# Patient Record
Sex: Female | Born: 1935 | Race: Black or African American | Hispanic: No | Smoking: Never smoker
Health system: Southern US, Community
[De-identification: ages and names within clinical notes are randomized; demographics above are authoritative.]

## PROBLEM LIST (undated history)

## (undated) DIAGNOSIS — K8020 Calculus of gallbladder without cholecystitis without obstruction: Secondary | ICD-10-CM

## (undated) DIAGNOSIS — M1990 Unspecified osteoarthritis, unspecified site: Secondary | ICD-10-CM

## (undated) DIAGNOSIS — Z95828 Presence of other vascular implants and grafts: Secondary | ICD-10-CM

## (undated) DIAGNOSIS — K259 Gastric ulcer, unspecified as acute or chronic, without hemorrhage or perforation: Secondary | ICD-10-CM

## (undated) DIAGNOSIS — Z8489 Family history of other specified conditions: Secondary | ICD-10-CM

## (undated) DIAGNOSIS — C189 Malignant neoplasm of colon, unspecified: Secondary | ICD-10-CM

## (undated) DIAGNOSIS — E7800 Pure hypercholesterolemia, unspecified: Secondary | ICD-10-CM

## (undated) DIAGNOSIS — H9190 Unspecified hearing loss, unspecified ear: Secondary | ICD-10-CM

## (undated) DIAGNOSIS — I82409 Acute embolism and thrombosis of unspecified deep veins of unspecified lower extremity: Secondary | ICD-10-CM

## (undated) DIAGNOSIS — R9431 Abnormal electrocardiogram [ECG] [EKG]: Secondary | ICD-10-CM

## (undated) DIAGNOSIS — I509 Heart failure, unspecified: Secondary | ICD-10-CM

## (undated) HISTORY — DX: Acute embolism and thrombosis of unspecified deep veins of unspecified lower extremity: I82.409

## (undated) HISTORY — DX: Heart failure, unspecified: I50.9

## (undated) HISTORY — PX: DILATION AND CURETTAGE OF UTERUS: SHX78

---

## 1996-10-02 DIAGNOSIS — C189 Malignant neoplasm of colon, unspecified: Secondary | ICD-10-CM

## 1996-10-02 DIAGNOSIS — Z95828 Presence of other vascular implants and grafts: Secondary | ICD-10-CM

## 1996-10-02 DIAGNOSIS — K259 Gastric ulcer, unspecified as acute or chronic, without hemorrhage or perforation: Secondary | ICD-10-CM

## 1996-10-02 HISTORY — DX: Malignant neoplasm of colon, unspecified: C18.9

## 1996-10-02 HISTORY — DX: Presence of other vascular implants and grafts: Z95.828

## 1996-10-02 HISTORY — PX: COLON SURGERY: SHX602

## 1996-10-02 HISTORY — DX: Gastric ulcer, unspecified as acute or chronic, without hemorrhage or perforation: K25.9

## 1998-05-25 ENCOUNTER — Ambulatory Visit (HOSPITAL_COMMUNITY): Admission: RE | Admit: 1998-05-25 | Discharge: 1998-05-25 | Payer: Self-pay | Admitting: *Deleted

## 1998-05-27 ENCOUNTER — Ambulatory Visit (HOSPITAL_COMMUNITY): Admission: RE | Admit: 1998-05-27 | Discharge: 1998-05-27 | Payer: Self-pay | Admitting: *Deleted

## 1998-06-01 ENCOUNTER — Ambulatory Visit (HOSPITAL_COMMUNITY): Admission: RE | Admit: 1998-06-01 | Discharge: 1998-06-01 | Payer: Self-pay | Admitting: *Deleted

## 1998-06-03 ENCOUNTER — Ambulatory Visit (HOSPITAL_COMMUNITY): Admission: RE | Admit: 1998-06-03 | Discharge: 1998-06-03 | Payer: Self-pay | Admitting: *Deleted

## 1998-07-22 ENCOUNTER — Encounter: Payer: Self-pay | Admitting: General Surgery

## 1998-07-27 ENCOUNTER — Ambulatory Visit (HOSPITAL_COMMUNITY): Admission: RE | Admit: 1998-07-27 | Discharge: 1998-07-27 | Payer: Self-pay | Admitting: General Surgery

## 1998-07-27 ENCOUNTER — Encounter: Payer: Self-pay | Admitting: General Surgery

## 1998-09-25 ENCOUNTER — Emergency Department (HOSPITAL_COMMUNITY): Admission: EM | Admit: 1998-09-25 | Discharge: 1998-09-26 | Payer: Self-pay | Admitting: Emergency Medicine

## 1998-12-08 ENCOUNTER — Ambulatory Visit (HOSPITAL_COMMUNITY): Admission: RE | Admit: 1998-12-08 | Discharge: 1998-12-08 | Payer: Self-pay | Admitting: Internal Medicine

## 1998-12-08 ENCOUNTER — Encounter: Payer: Self-pay | Admitting: Internal Medicine

## 1998-12-09 ENCOUNTER — Ambulatory Visit (HOSPITAL_COMMUNITY): Admission: RE | Admit: 1998-12-09 | Discharge: 1998-12-09 | Payer: Self-pay | Admitting: Internal Medicine

## 1998-12-09 ENCOUNTER — Encounter: Payer: Self-pay | Admitting: Internal Medicine

## 1998-12-22 ENCOUNTER — Ambulatory Visit (HOSPITAL_BASED_OUTPATIENT_CLINIC_OR_DEPARTMENT_OTHER): Admission: RE | Admit: 1998-12-22 | Discharge: 1998-12-22 | Payer: Self-pay | Admitting: Orthopedic Surgery

## 1999-01-25 ENCOUNTER — Encounter: Admission: RE | Admit: 1999-01-25 | Discharge: 1999-02-17 | Payer: Self-pay | Admitting: Orthopedic Surgery

## 1999-08-17 ENCOUNTER — Ambulatory Visit (HOSPITAL_COMMUNITY): Admission: RE | Admit: 1999-08-17 | Discharge: 1999-08-17 | Payer: Self-pay | Admitting: *Deleted

## 1999-12-14 ENCOUNTER — Ambulatory Visit (HOSPITAL_COMMUNITY): Admission: RE | Admit: 1999-12-14 | Discharge: 1999-12-14 | Payer: Self-pay | Admitting: *Deleted

## 2000-03-23 ENCOUNTER — Encounter: Admission: RE | Admit: 2000-03-23 | Discharge: 2000-04-05 | Payer: Self-pay | Admitting: Neurology

## 2000-05-31 ENCOUNTER — Encounter: Admission: RE | Admit: 2000-05-31 | Discharge: 2000-05-31 | Payer: Self-pay | Admitting: *Deleted

## 2001-05-16 ENCOUNTER — Other Ambulatory Visit: Admission: RE | Admit: 2001-05-16 | Discharge: 2001-05-16 | Payer: Self-pay | Admitting: *Deleted

## 2001-06-05 ENCOUNTER — Encounter: Admission: RE | Admit: 2001-06-05 | Discharge: 2001-06-05 | Payer: Self-pay | Admitting: *Deleted

## 2001-08-15 ENCOUNTER — Ambulatory Visit (HOSPITAL_COMMUNITY): Admission: RE | Admit: 2001-08-15 | Discharge: 2001-08-15 | Payer: Self-pay | Admitting: *Deleted

## 2001-10-31 ENCOUNTER — Encounter (INDEPENDENT_AMBULATORY_CARE_PROVIDER_SITE_OTHER): Payer: Self-pay | Admitting: *Deleted

## 2002-04-18 ENCOUNTER — Encounter: Admission: RE | Admit: 2002-04-18 | Discharge: 2002-04-18 | Payer: Self-pay | Admitting: Internal Medicine

## 2002-04-18 ENCOUNTER — Encounter: Payer: Self-pay | Admitting: Internal Medicine

## 2002-08-05 ENCOUNTER — Ambulatory Visit (HOSPITAL_COMMUNITY): Admission: RE | Admit: 2002-08-05 | Discharge: 2002-08-05 | Payer: Self-pay | Admitting: Oncology

## 2002-08-05 ENCOUNTER — Encounter (HOSPITAL_COMMUNITY): Payer: Self-pay | Admitting: Oncology

## 2003-12-16 ENCOUNTER — Ambulatory Visit (HOSPITAL_COMMUNITY): Admission: RE | Admit: 2003-12-16 | Discharge: 2003-12-16 | Payer: Self-pay | Admitting: *Deleted

## 2003-12-16 ENCOUNTER — Encounter (INDEPENDENT_AMBULATORY_CARE_PROVIDER_SITE_OTHER): Payer: Self-pay | Admitting: *Deleted

## 2004-02-09 ENCOUNTER — Encounter: Admission: RE | Admit: 2004-02-09 | Discharge: 2004-02-09 | Payer: Self-pay | Admitting: Internal Medicine

## 2005-03-02 ENCOUNTER — Ambulatory Visit: Payer: Self-pay | Admitting: Oncology

## 2005-07-17 ENCOUNTER — Encounter: Admission: RE | Admit: 2005-07-17 | Discharge: 2005-07-17 | Payer: Self-pay | Admitting: Internal Medicine

## 2005-07-31 ENCOUNTER — Encounter: Admission: RE | Admit: 2005-07-31 | Discharge: 2005-07-31 | Payer: Self-pay | Admitting: Internal Medicine

## 2005-09-05 ENCOUNTER — Ambulatory Visit: Payer: Self-pay | Admitting: Oncology

## 2005-09-05 ENCOUNTER — Ambulatory Visit: Admission: RE | Admit: 2005-09-05 | Discharge: 2005-09-05 | Payer: Self-pay | Admitting: Oncology

## 2006-02-25 ENCOUNTER — Ambulatory Visit: Payer: Self-pay | Admitting: Oncology

## 2006-03-02 LAB — CBC WITH DIFFERENTIAL/PLATELET
BASO%: 0.5 % (ref 0.0–2.0)
Basophils Absolute: 0 10*3/uL (ref 0.0–0.1)
EOS%: 5.4 % (ref 0.0–7.0)
Eosinophils Absolute: 0.2 10*3/uL (ref 0.0–0.5)
HCT: 38.7 % (ref 34.8–46.6)
HGB: 13.3 g/dL (ref 11.6–15.9)
LYMPH%: 28 % (ref 14.0–48.0)
MCH: 31.8 pg (ref 26.0–34.0)
MCHC: 34.3 g/dL (ref 32.0–36.0)
MCV: 92.7 fL (ref 81.0–101.0)
MONO#: 0.6 10*3/uL (ref 0.1–0.9)
MONO%: 13.2 % — ABNORMAL HIGH (ref 0.0–13.0)
NEUT#: 2.4 10*3/uL (ref 1.5–6.5)
NEUT%: 52.9 % (ref 39.6–76.8)
Platelets: 220 10*3/uL (ref 145–400)
RBC: 4.17 10*6/uL (ref 3.70–5.32)
RDW: 13.1 % (ref 11.3–14.5)
WBC: 4.6 10*3/uL (ref 3.9–10.0)
lymph#: 1.3 10*3/uL (ref 0.9–3.3)

## 2006-03-02 LAB — COMPREHENSIVE METABOLIC PANEL
ALT: 15 U/L (ref 0–40)
AST: 19 U/L (ref 0–37)
Albumin: 3.8 g/dL (ref 3.5–5.2)
Alkaline Phosphatase: 61 U/L (ref 39–117)
BUN: 12 mg/dL (ref 6–23)
CO2: 28 mEq/L (ref 19–32)
Calcium: 9.5 mg/dL (ref 8.4–10.5)
Chloride: 107 mEq/L (ref 96–112)
Creatinine, Ser: 1.05 mg/dL (ref 0.40–1.20)
Glucose, Bld: 84 mg/dL (ref 70–99)
Potassium: 4.1 mEq/L (ref 3.5–5.3)
Sodium: 142 mEq/L (ref 135–145)
Total Bilirubin: 0.6 mg/dL (ref 0.3–1.2)
Total Protein: 7 g/dL (ref 6.0–8.3)

## 2006-03-02 LAB — D-DIMER, QUANTITATIVE: D-Dimer, Quant: 0.33 ug/mL-FEU (ref 0.00–0.48)

## 2006-03-02 LAB — LACTATE DEHYDROGENASE: LDH: 189 U/L (ref 94–250)

## 2007-01-11 ENCOUNTER — Encounter: Admission: RE | Admit: 2007-01-11 | Discharge: 2007-01-11 | Payer: Self-pay | Admitting: Internal Medicine

## 2007-02-27 ENCOUNTER — Ambulatory Visit: Payer: Self-pay | Admitting: Oncology

## 2007-03-01 LAB — COMPREHENSIVE METABOLIC PANEL
ALT: 16 U/L (ref 0–35)
AST: 17 U/L (ref 0–37)
Albumin: 4 g/dL (ref 3.5–5.2)
Alkaline Phosphatase: 71 U/L (ref 39–117)
BUN: 14 mg/dL (ref 6–23)
CO2: 27 mEq/L (ref 19–32)
Calcium: 9.5 mg/dL (ref 8.4–10.5)
Chloride: 106 mEq/L (ref 96–112)
Creatinine, Ser: 0.95 mg/dL (ref 0.40–1.20)
Glucose, Bld: 92 mg/dL (ref 70–99)
Potassium: 3.9 mEq/L (ref 3.5–5.3)
Sodium: 142 mEq/L (ref 135–145)
Total Bilirubin: 0.6 mg/dL (ref 0.3–1.2)
Total Protein: 7.2 g/dL (ref 6.0–8.3)

## 2007-03-01 LAB — CBC WITH DIFFERENTIAL/PLATELET
BASO%: 0.5 % (ref 0.0–2.0)
Basophils Absolute: 0 10*3/uL (ref 0.0–0.1)
EOS%: 4.5 % (ref 0.0–7.0)
Eosinophils Absolute: 0.3 10*3/uL (ref 0.0–0.5)
HCT: 37.9 % (ref 34.8–46.6)
HGB: 13.5 g/dL (ref 11.6–15.9)
LYMPH%: 28 % (ref 14.0–48.0)
MCH: 32.2 pg (ref 26.0–34.0)
MCHC: 35.7 g/dL (ref 32.0–36.0)
MCV: 90.2 fL (ref 81.0–101.0)
MONO#: 0.6 10*3/uL (ref 0.1–0.9)
MONO%: 10 % (ref 0.0–13.0)
NEUT#: 3.6 10*3/uL (ref 1.5–6.5)
NEUT%: 57 % (ref 39.6–76.8)
Platelets: 194 10*3/uL (ref 145–400)
RBC: 4.2 10*6/uL (ref 3.70–5.32)
RDW: 12.4 % (ref 11.3–14.5)
WBC: 6.2 10*3/uL (ref 3.9–10.0)
lymph#: 1.7 10*3/uL (ref 0.9–3.3)

## 2007-03-01 LAB — LACTATE DEHYDROGENASE: LDH: 176 U/L (ref 94–250)

## 2008-02-26 ENCOUNTER — Ambulatory Visit: Payer: Self-pay | Admitting: Oncology

## 2008-02-28 LAB — CBC WITH DIFFERENTIAL/PLATELET
BASO%: 1 % (ref 0.0–2.0)
Basophils Absolute: 0.1 10*3/uL (ref 0.0–0.1)
EOS%: 4.1 % (ref 0.0–7.0)
Eosinophils Absolute: 0.3 10*3/uL (ref 0.0–0.5)
HCT: 40.5 % (ref 34.8–46.6)
HGB: 14.1 g/dL (ref 11.6–15.9)
LYMPH%: 34.7 % (ref 14.0–48.0)
MCH: 31.3 pg (ref 26.0–34.0)
MCHC: 34.8 g/dL (ref 32.0–36.0)
MCV: 90.1 fL (ref 81.0–101.0)
MONO#: 0.7 10*3/uL (ref 0.1–0.9)
MONO%: 11.7 % (ref 0.0–13.0)
NEUT#: 3.1 10*3/uL (ref 1.5–6.5)
NEUT%: 48.5 % (ref 39.6–76.8)
Platelets: 174 10*3/uL (ref 145–400)
RBC: 4.5 10*6/uL (ref 3.70–5.32)
RDW: 11.5 % (ref 11.3–14.5)
WBC: 6.4 10*3/uL (ref 3.9–10.0)
lymph#: 2.2 10*3/uL (ref 0.9–3.3)

## 2008-02-28 LAB — COMPREHENSIVE METABOLIC PANEL
ALT: 15 U/L (ref 0–35)
AST: 20 U/L (ref 0–37)
Albumin: 4 g/dL (ref 3.5–5.2)
Alkaline Phosphatase: 64 U/L (ref 39–117)
BUN: 12 mg/dL (ref 6–23)
CO2: 26 mEq/L (ref 19–32)
Calcium: 9.2 mg/dL (ref 8.4–10.5)
Chloride: 105 mEq/L (ref 96–112)
Creatinine, Ser: 1.03 mg/dL (ref 0.40–1.20)
Glucose, Bld: 96 mg/dL (ref 70–99)
Potassium: 4 mEq/L (ref 3.5–5.3)
Sodium: 141 mEq/L (ref 135–145)
Total Bilirubin: 0.6 mg/dL (ref 0.3–1.2)
Total Protein: 7.5 g/dL (ref 6.0–8.3)

## 2008-02-28 LAB — LACTATE DEHYDROGENASE: LDH: 214 U/L (ref 94–250)

## 2008-10-02 DIAGNOSIS — K8020 Calculus of gallbladder without cholecystitis without obstruction: Secondary | ICD-10-CM

## 2008-10-02 HISTORY — DX: Calculus of gallbladder without cholecystitis without obstruction: K80.20

## 2008-10-04 ENCOUNTER — Emergency Department (HOSPITAL_COMMUNITY): Admission: EM | Admit: 2008-10-04 | Discharge: 2008-10-04 | Payer: Self-pay | Admitting: Family Medicine

## 2008-10-04 ENCOUNTER — Encounter (INDEPENDENT_AMBULATORY_CARE_PROVIDER_SITE_OTHER): Payer: Self-pay | Admitting: *Deleted

## 2009-02-23 ENCOUNTER — Ambulatory Visit: Payer: Self-pay | Admitting: Oncology

## 2009-02-25 LAB — CBC WITH DIFFERENTIAL/PLATELET
BASO%: 0.6 % (ref 0.0–2.0)
Basophils Absolute: 0 10*3/uL (ref 0.0–0.1)
EOS%: 7.2 % — ABNORMAL HIGH (ref 0.0–7.0)
Eosinophils Absolute: 0.5 10*3/uL (ref 0.0–0.5)
HCT: 39.7 % (ref 34.8–46.6)
HGB: 13.6 g/dL (ref 11.6–15.9)
LYMPH%: 21.4 % (ref 14.0–49.7)
MCH: 31.6 pg (ref 25.1–34.0)
MCHC: 34.3 g/dL (ref 31.5–36.0)
MCV: 92 fL (ref 79.5–101.0)
MONO#: 0.7 10*3/uL (ref 0.1–0.9)
MONO%: 10.5 % (ref 0.0–14.0)
NEUT#: 4.1 10*3/uL (ref 1.5–6.5)
NEUT%: 60.3 % (ref 38.4–76.8)
Platelets: 193 10*3/uL (ref 145–400)
RBC: 4.32 10*6/uL (ref 3.70–5.45)
RDW: 12.7 % (ref 11.2–14.5)
WBC: 6.8 10*3/uL (ref 3.9–10.3)
lymph#: 1.5 10*3/uL (ref 0.9–3.3)

## 2009-02-25 LAB — COMPREHENSIVE METABOLIC PANEL
ALT: 19 U/L (ref 0–35)
AST: 24 U/L (ref 0–37)
Albumin: 3.6 g/dL (ref 3.5–5.2)
Alkaline Phosphatase: 59 U/L (ref 39–117)
BUN: 13 mg/dL (ref 6–23)
CO2: 27 mEq/L (ref 19–32)
Calcium: 9.1 mg/dL (ref 8.4–10.5)
Chloride: 104 mEq/L (ref 96–112)
Creatinine, Ser: 1.12 mg/dL (ref 0.40–1.20)
Glucose, Bld: 96 mg/dL (ref 70–99)
Potassium: 4.2 mEq/L (ref 3.5–5.3)
Sodium: 140 mEq/L (ref 135–145)
Total Bilirubin: 0.5 mg/dL (ref 0.3–1.2)
Total Protein: 6.9 g/dL (ref 6.0–8.3)

## 2009-02-25 LAB — LACTATE DEHYDROGENASE: LDH: 193 U/L (ref 94–250)

## 2010-02-24 ENCOUNTER — Ambulatory Visit: Payer: Self-pay | Admitting: Oncology

## 2010-03-28 ENCOUNTER — Ambulatory Visit: Payer: Self-pay | Admitting: Oncology

## 2010-03-28 LAB — CBC WITH DIFFERENTIAL/PLATELET
BASO%: 0.8 % (ref 0.0–2.0)
Basophils Absolute: 0.1 10*3/uL (ref 0.0–0.1)
EOS%: 6 % (ref 0.0–7.0)
Eosinophils Absolute: 0.4 10*3/uL (ref 0.0–0.5)
HCT: 41.5 % (ref 34.8–46.6)
HGB: 14.2 g/dL (ref 11.6–15.9)
LYMPH%: 29 % (ref 14.0–49.7)
MCH: 31.8 pg (ref 25.1–34.0)
MCHC: 34.3 g/dL (ref 31.5–36.0)
MCV: 92.7 fL (ref 79.5–101.0)
MONO#: 0.7 10*3/uL (ref 0.1–0.9)
MONO%: 10.4 % (ref 0.0–14.0)
NEUT#: 3.5 10*3/uL (ref 1.5–6.5)
NEUT%: 53.8 % (ref 38.4–76.8)
Platelets: 189 10*3/uL (ref 145–400)
RBC: 4.48 10*6/uL (ref 3.70–5.45)
RDW: 12.8 % (ref 11.2–14.5)
WBC: 6.6 10*3/uL (ref 3.9–10.3)
lymph#: 1.9 10*3/uL (ref 0.9–3.3)

## 2010-03-28 LAB — COMPREHENSIVE METABOLIC PANEL
ALT: 15 U/L (ref 0–35)
AST: 17 U/L (ref 0–37)
Albumin: 4 g/dL (ref 3.5–5.2)
Alkaline Phosphatase: 55 U/L (ref 39–117)
BUN: 18 mg/dL (ref 6–23)
CO2: 24 mEq/L (ref 19–32)
Calcium: 9.5 mg/dL (ref 8.4–10.5)
Chloride: 105 mEq/L (ref 96–112)
Creatinine, Ser: 1.07 mg/dL (ref 0.40–1.20)
Glucose, Bld: 93 mg/dL (ref 70–99)
Potassium: 4.2 mEq/L (ref 3.5–5.3)
Sodium: 143 mEq/L (ref 135–145)
Total Bilirubin: 0.5 mg/dL (ref 0.3–1.2)
Total Protein: 7.4 g/dL (ref 6.0–8.3)

## 2010-03-28 LAB — LACTATE DEHYDROGENASE: LDH: 189 U/L (ref 94–250)

## 2010-03-29 ENCOUNTER — Ambulatory Visit: Payer: Self-pay | Admitting: Gastroenterology

## 2010-03-29 DIAGNOSIS — Z85038 Personal history of other malignant neoplasm of large intestine: Secondary | ICD-10-CM | POA: Insufficient documentation

## 2010-04-11 ENCOUNTER — Ambulatory Visit: Payer: Self-pay | Admitting: Gastroenterology

## 2010-04-11 LAB — CONVERTED CEMR LAB
Fecal Occult Blood: NEGATIVE
Fecal Occult Blood: NEGATIVE
OCCULT 1: NEGATIVE
OCCULT 1: NEGATIVE
OCCULT 2: NEGATIVE
OCCULT 2: NEGATIVE
OCCULT 3: NEGATIVE
OCCULT 3: NEGATIVE
OCCULT 4: NEGATIVE
OCCULT 4: NEGATIVE
OCCULT 5: NEGATIVE
OCCULT 5: NEGATIVE

## 2010-04-26 ENCOUNTER — Encounter: Admission: RE | Admit: 2010-04-26 | Discharge: 2010-04-26 | Payer: Self-pay | Admitting: Oncology

## 2010-10-23 ENCOUNTER — Encounter: Payer: Self-pay | Admitting: Internal Medicine

## 2010-11-03 NOTE — Assessment & Plan Note (Signed)
History of Present Illness Visit Type: Initial Consult Primary GI MD: Rob Bunting MD Primary Provider: Jerene Dilling, MD Requesting Provider: Kimberlee Nearing, MD Chief Complaint: colon screening History of Present Illness:     who had a colon cancer in 1998, she underwent resection then chemotherapy for about 6 monhts.  Since then she sees Dr. Arline Asp every year for follow up.  He sent her to consider another colonoscopy, it has been 6 years since the last one.     Overall her health is "fine."  NO recent hospitalizations.  She never had trouble with the previous colonoscopies.  I had located records from Dr. Greggory Stallion or, colonoscopy 2003 and colonoscopy 2005 showed no recurrent cancers, no polyps.  She has no GI complaints.  Overall she has gained about 10 poiunds in the past year or so.           Allergies (verified): No Known Drug Allergies  Past History:  Past Medical History: elevated BP elevated cholesterol colon cancer 1998 (see above) DVT remotely (not still on coumadin)  Past Surgical History: colon resection 1998  Family History: no colon cancer except patient  Social History: widowed 1 daughter retired non-smoker non-etoh drinker  Review of Systems       Pertinent positive and negative review of systems were noted in the above HPI and GI specific review of systems.  All other review of systems was otherwise negative.   Vital Signs:  Patient profile:   77 year old female Height:      65 inches Weight:      189.13 pounds BMI:     31.59 Pulse rate:   68 / minute Pulse rhythm:   regular BP sitting:   134 / 82  (left arm) Cuff size:   regular  Vitals Entered By: June McMurray CMA Duncan Dull) (March 29, 2010 9:44 AM)  Physical Exam  Additional Exam:  Constitutional: generally well appearing Psychiatric: alert and oriented times 3 Eyes: extraocular movements intact Mouth: oropharynx moist, no lesions Neck: supple, no  lymphadenopathy Cardiovascular: heart regular rate and rythm Lungs: CTA bilaterally Abdomen: soft, non-tender, non-distended, no obvious ascites, no peritoneal signs, normal bowel sounds Extremities: no lower extremity edema bilaterally Skin: no lesions on visible extremities    Impression & Recommendations:  Problem # 1:  Personal history of colon cancer she is 77 years old without any overt GI symptoms. I offered her colonoscopy however she was not very interested. I did let her know that we generally stop surveillance, screening examinations around the age of 65. We did agree that she would complete 2 sets of Hemoccult cards and if any are positive then she would reconsider colonoscopy.  Patient Instructions: 1)  Will complete 2 sets of FOBT cards and send those back.  If any are positive, will set you up with a colonoscopy. 2)  A copy of this information will be sent to Dr. Arline Asp and your PCP. 3)  The medication list was reviewed and reconciled.  All changed / newly prescribed medications were explained.  A complete medication list was provided to the patient / caregiver.  Appended Document: Orders Update/stool cards    Clinical Lists Changes  Problems: Added new problem of NEOPLASM, MALIGNANT, COLON, HX OF (ICD-V10.05) Orders: Added new Test order of [facility] Hemoccult Cards #6 (take home) (Hem Cards #6) - Signed      Appended Document: med update    Clinical Lists Changes  Medications: Added new medication of KLOR-CON M20 20  MEQ CR-TABS (POTASSIUM CHLORIDE CRYS CR) 1 by mouth once daily Added new medication of PRAVACHOL 20 MG TABS (PRAVASTATIN SODIUM) 1 by mouth once daily Added new medication of FLEXERIL 10 MG TABS (CYCLOBENZAPRINE HCL) 1 by mouth two times a day as needed Added new medication of FUROSEMIDE 40 MG TABS (FUROSEMIDE) 1 by mouth two times a day

## 2010-11-03 NOTE — Procedures (Signed)
Summary: Colon   Colonoscopy  Procedure date:  10/31/2001  Findings:      Location:  [facility].                          [facility]  Patient:    Julia Robbins, Julia Robbins Visit Number: [id number] MRN: [id number]          Service Type: END Location: ENDO Attending Physician:  Sabino Gasser Dictated by:   Sabino Gasser, M.D. Admit Date:  08/15/2001 Discharge Date: 08/15/2001                             Procedure Report  PROCEDURE:  Colonoscopy.  ENDOSCOPIST:  Sabino Gasser, M.D.  INDICATIONS:  Followup of colon cancer.  ANESTHESIA:  Demerol 50, Versed 3 mg.  DESCRIPTION OF PROCEDURE:  With the patient mildly sedated in the left lateral decubitus position the Olympus videoscopic colonoscope was inserted in the rectum and passed, under direct vision, until the cecum was identified by termination of the colon.  This was photographed.  The endoscope was then slowly withdrawn taking circumferential views of the remaining colonic mucosa stopping to photograph mild amount of diverticulosis seen in the remnant of sigmoid colon until we reached the rectum which appeared normal on direct view and showed possibly small internal hemorrhoids on retroflex view.  The endoscope was then straightened and withdrawn.  The patients vital signs and pulse oximeter remained stable.  The patient tolerated the procedure well without apparent complications.  FINDINGS:  No residual colon cancer found.  No polyp tissue noted.  Mild diverticulosis seen.  PLAN:  Repeat examination in possibly 2-3 years. Dictated by:   Sabino Gasser, M.D. Attending Physician:  Sabino Gasser DD:  08/15/01 TD:  08/15/01 Job: [id number]

## 2010-11-03 NOTE — Procedures (Signed)
Summary: Colon   Colonoscopy  Procedure date:  12/16/2003  Findings:      Location:  [facility].   NAME:  Julia Robbins, Julia Robbins                         ACCOUNT NO.:  [id number]   MEDICAL RECORD NO.:  [id number]                   PATIENT TYPE:  AMB   LOCATION:  ENDO                                 FACILITY:  [facility]   PHYSICIAN:  Georgiana Spinner, M.D.                 DATE OF BIRTH:  12-14-1922   DATE OF PROCEDURE:  12/16/2003  DATE OF DISCHARGE:                                 OPERATIVE REPORT   PROCEDURE:  Colonoscopy.   INDICATIONS:  Colon cancer.   ANESTHESIA:  1. Demerol 40 mg.  2. Versed 4 mg.   DESCRIPTION OF PROCEDURE:  With patient mildly sedated in the left lateral  decubitus position, the Olympus videoscopic colonoscope was inserted in the  rectum, passed under direct vision to the neocecum, which was photographed.  From this point, the colonoscope was slowly withdrawn, taking  circumferential views of colonic mucosa, stopping only in the rectum which  appeared normal on direct and showed hemorrhoids on retroflexed view.  The  endoscope was straightened and withdrawn.  The patient's vital signs and  pulse oximeter remained stable.  The patient tolerated the procedure well  without apparent complications.   FINDINGS:  Unremarkable examination to the neocecum.   PLAN:  Have patient follow up with me in 2 years or as needed.                                               Georgiana Spinner, M.D.    GMO/MEDQ  D:  12/16/2003  T:  12/16/2003  Job:  [id number]

## 2011-01-16 LAB — PROTIME-INR
INR: 1.1 (ref 0.00–1.49)
Prothrombin Time: 14.9 seconds (ref 11.6–15.2)

## 2011-01-16 LAB — POCT CARDIAC MARKERS
CKMB, poc: 1.9 ng/mL (ref 1.0–8.0)
Myoglobin, poc: 305 ng/mL (ref 12–200)
Troponin i, poc: <0.05 ng/mL (ref 0.00–0.09)

## 2011-01-16 LAB — LIPASE, BLOOD: Lipase: 24 U/L (ref 11–59)

## 2011-01-16 LAB — DIFFERENTIAL
Basophils Absolute: 0 10*3/uL (ref 0.0–0.1)
Basophils Relative: 0 % (ref 0–1)
Eosinophils Absolute: 0 10*3/uL (ref 0.0–0.7)
Eosinophils Relative: 1 % (ref 0–5)
Lymphocytes Relative: 13 % (ref 12–46)
Lymphs Abs: 0.8 10*3/uL (ref 0.7–4.0)
Monocytes Absolute: 0.7 10*3/uL (ref 0.1–1.0)
Monocytes Relative: 11 % (ref 3–12)
Neutro Abs: 4.6 10*3/uL (ref 1.7–7.7)
Neutrophils Relative %: 75 % (ref 43–77)

## 2011-01-16 LAB — COMPREHENSIVE METABOLIC PANEL
ALT: 23 U/L (ref 0–35)
AST: 29 U/L (ref 0–37)
Albumin: 3.8 g/dL (ref 3.5–5.2)
Alkaline Phosphatase: 59 U/L (ref 39–117)
BUN: 24 mg/dL — ABNORMAL HIGH (ref 6–23)
CO2: 24 mEq/L (ref 19–32)
Calcium: 9.5 mg/dL (ref 8.4–10.5)
Chloride: 102 mEq/L (ref 96–112)
Creatinine, Ser: 1.21 mg/dL — ABNORMAL HIGH (ref 0.4–1.2)
GFR calc Af Amer: 51 mL/min — ABNORMAL LOW (ref >60)
GFR calc non Af Amer: 42 mL/min — ABNORMAL LOW (ref >60)
Glucose, Bld: 113 mg/dL — ABNORMAL HIGH (ref 70–99)
Potassium: 4.3 mEq/L (ref 3.5–5.1)
Sodium: 136 mEq/L (ref 135–145)
Total Bilirubin: 1 mg/dL (ref 0.3–1.2)
Total Protein: 8.1 g/dL (ref 6.0–8.3)

## 2011-01-16 LAB — URINE MICROSCOPIC-ADD ON

## 2011-01-16 LAB — URINALYSIS, ROUTINE W REFLEX MICROSCOPIC
Glucose, UA: NEGATIVE mg/dL
Hgb urine dipstick: NEGATIVE
Ketones, ur: 15 mg/dL — AB
Leukocytes, UA: NEGATIVE
Nitrite: NEGATIVE
Protein, ur: 30 mg/dL — AB
Specific Gravity, Urine: 1.038 — ABNORMAL HIGH (ref 1.005–1.030)
Urobilinogen, UA: 0.2 mg/dL (ref 0.0–1.0)
pH: 5 (ref 5.0–8.0)

## 2011-01-16 LAB — CBC
HCT: 46 % (ref 36.0–46.0)
Hemoglobin: 15.6 g/dL — ABNORMAL HIGH (ref 12.0–15.0)
MCHC: 34 g/dL (ref 30.0–36.0)
MCV: 93.1 fL (ref 78.0–100.0)
Platelets: 191 10*3/uL (ref 150–400)
RBC: 4.94 MIL/uL (ref 3.87–5.11)
RDW: 13 % (ref 11.5–15.5)
WBC: 6.2 10*3/uL (ref 4.0–10.5)

## 2011-01-16 LAB — APTT: aPTT: 33 seconds (ref 24–37)

## 2011-01-16 LAB — D-DIMER, QUANTITATIVE: D-Dimer, Quant: 0.38 ug/mL-FEU (ref 0.00–0.48)

## 2011-01-19 ENCOUNTER — Ambulatory Visit
Admission: RE | Admit: 2011-01-19 | Discharge: 2011-01-19 | Disposition: A | Discharge status: Home / Self Care | Payer: MEDICARE | Source: Ambulatory Visit | Attending: Internal Medicine | Admitting: Internal Medicine

## 2011-01-19 ENCOUNTER — Other Ambulatory Visit: Payer: Self-pay | Admitting: Internal Medicine

## 2011-01-19 DIAGNOSIS — M79604 Pain in right leg: Secondary | ICD-10-CM

## 2011-01-19 DIAGNOSIS — R609 Edema, unspecified: Secondary | ICD-10-CM

## 2011-02-17 NOTE — Op Note (Signed)
NAME:  Julia Robbins, Julia Robbins                         ACCOUNT NO.:  [id number]   MEDICAL RECORD NO.:  [id number]                   PATIENT TYPE:  AMB   LOCATION:  ENDO                                 FACILITY[facility]  [facility]   PHYSICIAN:  Georgiana Spinner, M.D.                 DATE OF BIRTH:  1923-04-06   DATE OF PROCEDURE:  12/16/2003  DATE OF DISCHARGE:                                 OPERATIVE REPORT   PROCEDURE:  Colonoscopy.   INDICATIONS:  Colon cancer.   ANESTHESIA:  1. Demerol 40 mg.  2. Versed 4 mg.   DESCRIPTION OF PROCEDURE:  With patient mildly sedated in the left lateral  decubitus position, the Olympus videoscopic colonoscope was inserted in the  rectum, passed under direct vision to the neocecum, which was photographed.  From this point, the colonoscope was slowly withdrawn, taking  circumferential views of colonic mucosa, stopping only in the rectum which  appeared normal on direct and showed hemorrhoids on retroflexed view.  The  endoscope was straightened and withdrawn.  The patient's vital signs and  pulse oximeter remained stable.  The patient tolerated the procedure well  without apparent complications.   FINDINGS:  Unremarkable examination to the neocecum.   PLAN:  Have patient follow up with me in 2 years or as needed.                                               Georgiana Spinner, M.D.    GMO/MEDQ  D:  12/16/2003  T:  12/16/2003  Job:  [id number]

## 2011-02-17 NOTE — Procedures (Signed)
[facility]  Patient:    SYVANNA, CIOLINO Visit Number: [id number] MRN: [id number]          Service Type: END Location: ENDO Attending Physician:  Sabino Gasser Dictated by:   Sabino Gasser, M.D. Admit Date:  08/15/2001 Discharge Date: 08/15/2001                             Procedure Report  PROCEDURE:  Colonoscopy.  ENDOSCOPIST:  Sabino Gasser, M.D.  INDICATIONS:  Followup of colon cancer.  ANESTHESIA:  Demerol 50, Versed 3 mg.  DESCRIPTION OF PROCEDURE:  With the patient mildly sedated in the left lateral decubitus position the Olympus videoscopic colonoscope was inserted in the rectum and passed, under direct vision, until the cecum was identified by termination of the colon.  This was photographed.  The endoscope was then slowly withdrawn taking circumferential views of the remaining colonic mucosa stopping to photograph mild amount of diverticulosis seen in the remnant of sigmoid colon until we reached the rectum which appeared normal on direct view and showed possibly small internal hemorrhoids on retroflex view.  The endoscope was then straightened and withdrawn.  The patients vital signs and pulse oximeter remained stable.  The patient tolerated the procedure well without apparent complications.  FINDINGS:  No residual colon cancer found.  No polyp tissue noted.  Mild diverticulosis seen.  PLAN:  Repeat examination in possibly 2-3 years. Dictated by:   Sabino Gasser, M.D. Attending Physician:  Sabino Gasser DD:  08/15/01 TD:  08/15/01 Job: [id number]

## 2011-03-20 ENCOUNTER — Other Ambulatory Visit: Payer: Self-pay | Admitting: Internal Medicine

## 2011-03-20 DIAGNOSIS — Z1231 Encounter for screening mammogram for malignant neoplasm of breast: Secondary | ICD-10-CM

## 2011-03-28 ENCOUNTER — Other Ambulatory Visit (HOSPITAL_COMMUNITY): Payer: Self-pay | Admitting: Oncology

## 2011-03-28 ENCOUNTER — Encounter (HOSPITAL_BASED_OUTPATIENT_CLINIC_OR_DEPARTMENT_OTHER): Payer: Medicare Other | Admitting: Oncology

## 2011-03-28 DIAGNOSIS — C183 Malignant neoplasm of hepatic flexure: Secondary | ICD-10-CM

## 2011-03-28 DIAGNOSIS — Z86718 Personal history of other venous thrombosis and embolism: Secondary | ICD-10-CM

## 2011-03-28 LAB — CBC WITH DIFFERENTIAL/PLATELET
BASO%: 0.5 % (ref 0.0–2.0)
Basophils Absolute: 0 10*3/uL (ref 0.0–0.1)
EOS%: 6.7 % (ref 0.0–7.0)
Eosinophils Absolute: 0.3 10*3/uL (ref 0.0–0.5)
HCT: 39.9 % (ref 34.8–46.6)
HGB: 13.8 g/dL (ref 11.6–15.9)
LYMPH%: 28.5 % (ref 14.0–49.7)
MCH: 32.1 pg (ref 25.1–34.0)
MCHC: 34.5 g/dL (ref 31.5–36.0)
MCV: 93 fL (ref 79.5–101.0)
MONO#: 0.6 10*3/uL (ref 0.1–0.9)
MONO%: 11.8 % (ref 0.0–14.0)
NEUT#: 2.7 10*3/uL (ref 1.5–6.5)
NEUT%: 52.5 % (ref 38.4–76.8)
Platelets: 173 10*3/uL (ref 145–400)
RBC: 4.29 10*6/uL (ref 3.70–5.45)
RDW: 12.9 % (ref 11.2–14.5)
WBC: 5.2 10*3/uL (ref 3.9–10.3)
lymph#: 1.5 10*3/uL (ref 0.9–3.3)

## 2011-03-28 LAB — COMPREHENSIVE METABOLIC PANEL
ALT: 14 U/L (ref 0–35)
AST: 19 U/L (ref 0–37)
Albumin: 3.9 g/dL (ref 3.5–5.2)
Alkaline Phosphatase: 49 U/L (ref 39–117)
BUN: 18 mg/dL (ref 6–23)
CO2: 30 mEq/L (ref 19–32)
Calcium: 9.2 mg/dL (ref 8.4–10.5)
Chloride: 106 mEq/L (ref 96–112)
Creatinine, Ser: 1.02 mg/dL (ref 0.50–1.10)
Glucose, Bld: 95 mg/dL (ref 70–99)
Potassium: 4.2 mEq/L (ref 3.5–5.3)
Sodium: 143 mEq/L (ref 135–145)
Total Bilirubin: 0.6 mg/dL (ref 0.3–1.2)
Total Protein: 7.2 g/dL (ref 6.0–8.3)

## 2011-03-28 LAB — LACTATE DEHYDROGENASE: LDH: 186 U/L (ref 94–250)

## 2011-04-28 ENCOUNTER — Ambulatory Visit
Admission: RE | Admit: 2011-04-28 | Discharge: 2011-04-28 | Disposition: A | Discharge status: Home / Self Care | Payer: Medicare Other | Source: Ambulatory Visit | Attending: Internal Medicine | Admitting: Internal Medicine

## 2011-04-28 DIAGNOSIS — Z1231 Encounter for screening mammogram for malignant neoplasm of breast: Secondary | ICD-10-CM

## 2012-02-29 ENCOUNTER — Telehealth: Payer: Self-pay | Admitting: Oncology

## 2012-02-29 NOTE — Telephone Encounter (Signed)
S/w pt re appt for 6/27 @ 10:30 am.

## 2012-03-28 ENCOUNTER — Telehealth: Payer: Self-pay | Admitting: Oncology

## 2012-03-28 ENCOUNTER — Ambulatory Visit (HOSPITAL_BASED_OUTPATIENT_CLINIC_OR_DEPARTMENT_OTHER): Payer: Medicare Other | Admitting: Oncology

## 2012-03-28 ENCOUNTER — Other Ambulatory Visit: Payer: Medicare Other | Admitting: Lab

## 2012-03-28 ENCOUNTER — Encounter: Payer: Self-pay | Admitting: Oncology

## 2012-03-28 ENCOUNTER — Ambulatory Visit: Payer: Medicare Other | Admitting: Oncology

## 2012-03-28 ENCOUNTER — Other Ambulatory Visit: Payer: Medicare Other

## 2012-03-28 VITALS — BP 140/81 | HR 95 | Temp 97.7°F | Ht 65.0 in | Wt 183.1 lb

## 2012-03-28 DIAGNOSIS — Z86718 Personal history of other venous thrombosis and embolism: Secondary | ICD-10-CM

## 2012-03-28 DIAGNOSIS — Z85038 Personal history of other malignant neoplasm of large intestine: Secondary | ICD-10-CM

## 2012-03-28 LAB — COMPREHENSIVE METABOLIC PANEL
ALT: 20 U/L (ref 0–35)
AST: 24 U/L (ref 0–37)
Albumin: 4.1 g/dL (ref 3.5–5.2)
Alkaline Phosphatase: 54 U/L (ref 39–117)
BUN: 16 mg/dL (ref 6–23)
CO2: 26 mEq/L (ref 19–32)
Calcium: 9.6 mg/dL (ref 8.4–10.5)
Chloride: 105 mEq/L (ref 96–112)
Creatinine, Ser: 1.1 mg/dL (ref 0.50–1.10)
Glucose, Bld: 89 mg/dL (ref 70–99)
Potassium: 4.5 mEq/L (ref 3.5–5.3)
Sodium: 141 mEq/L (ref 135–145)
Total Bilirubin: 0.6 mg/dL (ref 0.3–1.2)
Total Protein: 7.5 g/dL (ref 6.0–8.3)

## 2012-03-28 LAB — CBC WITH DIFFERENTIAL/PLATELET
BASO%: 0.7 % (ref 0.0–2.0)
Basophils Absolute: 0 10*3/uL (ref 0.0–0.1)
EOS%: 3.3 % (ref 0.0–7.0)
Eosinophils Absolute: 0.2 10*3/uL (ref 0.0–0.5)
HCT: 42.4 % (ref 34.8–46.6)
HGB: 14.4 g/dL (ref 11.6–15.9)
LYMPH%: 33.7 % (ref 14.0–49.7)
MCH: 31.7 pg (ref 25.1–34.0)
MCHC: 33.9 g/dL (ref 31.5–36.0)
MCV: 93.4 fL (ref 79.5–101.0)
MONO#: 0.7 10*3/uL (ref 0.1–0.9)
MONO%: 10.9 % (ref 0.0–14.0)
NEUT#: 3.4 10*3/uL (ref 1.5–6.5)
NEUT%: 51.4 % (ref 38.4–76.8)
Platelets: 187 10*3/uL (ref 145–400)
RBC: 4.54 10*6/uL (ref 3.70–5.45)
RDW: 12.9 % (ref 11.2–14.5)
WBC: 6.7 10*3/uL (ref 3.9–10.3)
lymph#: 2.3 10*3/uL (ref 0.9–3.3)

## 2012-03-28 LAB — LACTATE DEHYDROGENASE: LDH: 186 U/L (ref 94–250)

## 2012-03-28 NOTE — Progress Notes (Signed)
This office note has been dictated.  #[id number]

## 2012-03-28 NOTE — Progress Notes (Signed)
CC:   Julia Robbins, M.D. Lorne Skeens. Hoxworth, M.D.  PROBLEM LIST: 1. 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  2/15 lymph nodes were positive.  The patient     underwent colectomy and adjuvant chemotherapy with 5-FU and     leucovorin.  Chemotherapy treatments were completed in February     1999.  The patient has remained disease free. 2. DVT involving the right lower extremity in August 1998 at the time     of presentation of her colon cancer.  An inferior vena cava     Greenfield filter was placed.  The patient is no longer on     Coumadin. 3. History of gastric antral ulcer, H pylori positive August 1998. 4. History of cholelithiasis. 5. History of diverticulosis. 6. Dyslipidemia. 7. Degenerative arthritis. 8. Systolic ejection murmur.  MEDICATIONS: 1. Lasix 40 mg daily. 2. Iron complex 1 three times a week. 3. Magnesium 1 daily. 4. Skelaxin 800 mg 3 times daily. 5. Pamelor 10 mg at bedtime. 6. K-Dur 20 mEq twice daily. 7. Pravachol 40 mg daily.  HISTORY:  I am seeing Julia Robbins for followup of her stage III adenocarcinoma of the hepatic flexure dating back to July of 1998, i.e. 15 years ago.  The patient comes back and sees [place] on a yearly basis at her request.  She is currently living in assisted living.  She does not drive.  She has a daughter and grandson who live in [place].  The patient has done extraordinarily well, is now 78 years old.  She was last seen by [place] on 03/28/2011.  She denies any medical problems over the past year.  Her only problem is that she has generalized stiffness and occasionally some swelling of her legs.  She denies any change of bowel habit, any blood in her stools.  She had seen Dr. Rob Bunting 2 years ago and he felt that repeat colonoscopy was not indicated in view of her age.  PHYSICAL EXAM:  Julia Robbins looks well.  There have been no major changes in her condition although she  has lost a few pounds from a year ago. She uses a cane in her right hand.  She needs some assistance getting up and down from the examining table.  Weight today is 183.1 pounds as compared with 187.6 pounds a year ago.  Height 5 feet 5 inches, body surface area 1.95 m2.  Blood pressure 140/81.  Other vital signs are normal.  There is no scleral icterus.  She wears glasses.  Mouth and pharynx benign.  No peripheral adenopathy palpable.  Lungs:  Clear to percussion and auscultation.  Cardiac:  Regular rhythm with systolic ejection murmur heard best with the patient supine.  Breasts:  Not examined.  Abdomen:  Benign, somewhat obese, nontender with no organomegaly or masses palpable.  Extremities:  Somewhat tight without actual pitting edema.  Neurologic:  Nonfocal.  LABORATORY DATA:  Today, white count 6.7, ANC 3.4, hemoglobin 14.4, hematocrit 42.4, platelets 187,000.  Chemistries today are pending. Chemistries from 03/28/2011 were completely normal.  IMAGING STUDIES: 1. CT scan of abdomen and pelvis with IV contrast on 10/04/2008 showed     cholelithiasis and stable appearance of the IVC filter. 2. Screening bilateral mammograms from 04/26/2010 were negative. 3. Right lower extremity venous duplex ultrasound on 01/19/2011 showed     no evidence of a right lower extremity deep venous thrombosis. 4. Digital screening mammogram  on 04/28/2011 was negative.  IMPRESSION AND PLAN:  Julia Robbins is now out 15 years from the time of diagnosis of her stage III colon cancer without evidence for recurrence. She continues to do well.  I suggested that she continue with bilateral screening mammograms.  She is due for these next month in July.  At her Request, we will see Julia Robbins again in 1 year at which time will check CBC and chemistries.    ______________________________ Samul Dada, M.D. DSM/MEDQ  D:  03/28/2012  T:  03/28/2012  Job:  [id number]

## 2012-03-28 NOTE — Telephone Encounter (Signed)
appts made and printed for pt aom °

## 2012-10-30 ENCOUNTER — Encounter (HOSPITAL_COMMUNITY): Payer: Self-pay | Admitting: Emergency Medicine

## 2012-10-30 ENCOUNTER — Observation Stay (HOSPITAL_COMMUNITY)
Admission: EM | Admit: 2012-10-30 | Discharge: 2012-10-31 | Disposition: A | Discharge status: Home / Self Care | Payer: Medicare Other | Attending: Internal Medicine | Admitting: Internal Medicine

## 2012-10-30 DIAGNOSIS — Z9049 Acquired absence of other specified parts of digestive tract: Secondary | ICD-10-CM | POA: Insufficient documentation

## 2012-10-30 DIAGNOSIS — Z9221 Personal history of antineoplastic chemotherapy: Secondary | ICD-10-CM | POA: Insufficient documentation

## 2012-10-30 DIAGNOSIS — L03119 Cellulitis of unspecified part of limb: Secondary | ICD-10-CM

## 2012-10-30 DIAGNOSIS — L309 Dermatitis, unspecified: Secondary | ICD-10-CM | POA: Diagnosis present

## 2012-10-30 DIAGNOSIS — Z86718 Personal history of other venous thrombosis and embolism: Secondary | ICD-10-CM | POA: Insufficient documentation

## 2012-10-30 DIAGNOSIS — R21 Rash and other nonspecific skin eruption: Principal | ICD-10-CM | POA: Insufficient documentation

## 2012-10-30 DIAGNOSIS — E785 Hyperlipidemia, unspecified: Secondary | ICD-10-CM | POA: Insufficient documentation

## 2012-10-30 DIAGNOSIS — Z85038 Personal history of other malignant neoplasm of large intestine: Secondary | ICD-10-CM | POA: Insufficient documentation

## 2012-10-30 HISTORY — DX: Presence of other vascular implants and grafts: Z95.828

## 2012-10-30 LAB — BASIC METABOLIC PANEL
BUN: 14 mg/dL (ref 6–23)
CO2: 30 mEq/L (ref 19–32)
Calcium: 9.5 mg/dL (ref 8.4–10.5)
Chloride: 103 mEq/L (ref 96–112)
Creatinine, Ser: 1.08 mg/dL (ref 0.50–1.10)
GFR calc Af Amer: 52 mL/min — ABNORMAL LOW (ref >90)
GFR calc non Af Amer: 45 mL/min — ABNORMAL LOW (ref >90)
Glucose, Bld: 97 mg/dL (ref 70–99)
Potassium: 4.1 mEq/L (ref 3.5–5.1)
Sodium: 141 mEq/L (ref 135–145)

## 2012-10-30 LAB — CBC WITH DIFFERENTIAL/PLATELET
Basophils Absolute: 0 10*3/uL (ref 0.0–0.1)
Basophils Relative: 1 % (ref 0–1)
Eosinophils Absolute: 0.6 10*3/uL (ref 0.0–0.7)
Eosinophils Relative: 8 % — ABNORMAL HIGH (ref 0–5)
HCT: 39.6 % (ref 36.0–46.0)
Hemoglobin: 13.2 g/dL (ref 12.0–15.0)
Lymphocytes Relative: 25 % (ref 12–46)
Lymphs Abs: 2.1 10*3/uL (ref 0.7–4.0)
MCH: 31.1 pg (ref 26.0–34.0)
MCHC: 33.3 g/dL (ref 30.0–36.0)
MCV: 93.4 fL (ref 78.0–100.0)
Monocytes Absolute: 0.9 10*3/uL (ref 0.1–1.0)
Monocytes Relative: 11 % (ref 3–12)
Neutro Abs: 4.7 10*3/uL (ref 1.7–7.7)
Neutrophils Relative %: 56 % (ref 43–77)
Platelets: 201 10*3/uL (ref 150–400)
RBC: 4.24 MIL/uL (ref 3.87–5.11)
RDW: 12.5 % (ref 11.5–15.5)
WBC: 8.5 10*3/uL (ref 4.0–10.5)

## 2012-10-30 LAB — ACETAMINOPHEN LEVEL: Acetaminophen (Tylenol), Serum: <15 ug/mL (ref 10–30)

## 2012-10-30 MED ORDER — VANCOMYCIN HCL IN DEXTROSE 1-5 GM/200ML-% IV SOLN
1000.0000 mg | Freq: Once | INTRAVENOUS | Status: AC
  Administered 2012-10-30: 1000 mg via INTRAVENOUS
  Filled 2012-10-30: qty 200

## 2012-10-30 MED ORDER — ACETAMINOPHEN 325 MG PO TABS
650.0000 mg | ORAL_TABLET | Freq: Once | ORAL | Status: AC
  Administered 2012-10-31: 650 mg via ORAL
  Filled 2012-10-30: qty 2

## 2012-10-30 MED ORDER — DIPHENHYDRAMINE HCL 50 MG/ML IJ SOLN
25.0000 mg | Freq: Once | INTRAMUSCULAR | Status: AC
  Administered 2012-10-30: 25 mg via INTRAVENOUS
  Filled 2012-10-30: qty 1

## 2012-10-30 MED ORDER — SODIUM CHLORIDE 0.9 % IV BOLUS (SEPSIS)
500.0000 mL | Freq: Once | INTRAVENOUS | Status: AC
  Administered 2012-10-30: 500 mL via INTRAVENOUS

## 2012-10-30 NOTE — ED Provider Notes (Signed)
History    79 year old female complaining of rash, pain and swelling to bilateral feet. Onset approximately 1 week ago. Started in area of medial aspect of her right heel and slowly progressed. Bilateral feet and ankles are painful. Since this morning the patient has had a rash extending up to bilateral legs and also affecting the dorsum of both of her hands. This rash is itchy. Subjective fever. Patient recently switched preparations of nortriptyline. This was done on January 10th. No other complaints. No mouth sores. No joint pain. No abdominal pain. Not diabetic.   CSN: [id number]  Arrival date & time 10/30/12  2125   First MD Initiated Contact with Patient 10/30/12 2148      Chief Complaint  Patient presents with  . Leg Swelling    (Consider location/radiation/quality/duration/timing/severity/associated sxs/prior treatment) HPI  Past Medical History  Diagnosis Date  . Cancer     colon  . Greenfield filter in place     Past Surgical History  Procedure Date  . Colon surgery     No family history on file.  History  Substance Use Topics  . Smoking status: Never Smoker   . Smokeless tobacco: Not on file  . Alcohol Use: No    OB History    Grav Para Term Preterm Abortions TAB SAB Ect Mult Living                  Review of Systems  All systems reviewed and negative, other than as noted in HPI.   Allergies  Review of patient's allergies indicates no known allergies.  Home Medications   Current Outpatient Rx  Name  Route  Sig  Dispense  Refill  . CELEBREX PO   Oral   Take 1 capsule by mouth daily as needed. For pain         . CYCLOBENZAPRINE HCL 10 MG PO TABS   Oral   Take 10 mg by mouth 2 (two) times daily as needed. For muscle spasm         . FUROSEMIDE 40 MG PO TABS   Oral   Take 80 mg by mouth every morning.         Marland Kitchen MAGNESIUM-ZINC PO   Oral   Take 1 tablet by mouth every morning.         Marland Kitchen NORTRIPTYLINE HCL 10 MG PO CAPS   Oral  Take 10 mg by mouth 2 (two) times daily.          Marland Kitchen POTASSIUM CHLORIDE CRYS ER 20 MEQ PO TBCR   Oral   Take 20 mEq by mouth 2 (two) times daily.         Marland Kitchen PRAVASTATIN SODIUM 40 MG PO TABS   Oral   Take 40 mg by mouth every evening.            BP 133/89  Pulse 110  Temp 100.3 F (37.9 C) (Rectal)  Resp 20  Ht 5\' 5"  (1.651 m)  Wt 180 lb (81.647 kg)  BMI 29.95 kg/m2  SpO2 100%  Physical Exam  Nursing note and vitals reviewed. Constitutional: She appears well-developed and well-nourished. No distress.  HENT:  Head: Normocephalic and atraumatic.  Eyes: Conjunctivae normal are normal. Right eye exhibits no discharge. Left eye exhibits no discharge.  Neck: Neck supple.  Cardiovascular: Normal rate, regular rhythm and normal heart sounds.  Exam reveals no gallop and no friction rub.   No murmur heard. Pulmonary/Chest: Effort normal and breath sounds normal. No respiratory  distress.  Abdominal: Soft. She exhibits no distension. There is no tenderness.  Musculoskeletal: She exhibits no edema and no tenderness.  Neurological: She is alert.  Skin: Skin is warm and dry.       Confluent area of erythema, increased warmth and tenderness to bilateral ankles/feet. Poorly demarcated. Extending proximally from this there are scattered areas of erythematous papules and some small macules. This rash extends to the abdomen and also noted on dorsum of bilateral hands. Does not appear to involve back or face. Confluent erythematous areas under b/l breasts, L>R.  No oral involvement. Negative nikolsky.    Psychiatric: She has a normal mood and affect. Her behavior is normal. Thought content normal.    ED Course  Procedures (including critical care time)  Labs Reviewed  CBC WITH DIFFERENTIAL - Abnormal; Notable for the following:    Eosinophils Relative 8 (*)     All other components within normal limits  BASIC METABOLIC PANEL - Abnormal; Notable for the following:    GFR calc non Af Amer  45 (*)     GFR calc Af Amer 52 (*)     All other components within normal limits  ACETAMINOPHEN LEVEL  CULTURE, BLOOD (ROUTINE X 2)  CULTURE, BLOOD (ROUTINE X 2)   No results found.   1. Cellulitis, leg   2. Rash       MDM  89yF with rash. Skin changes to feet/ankles which may potentially be cellulitis. Pt also with diffuse symmetric maculopapular erythematous rash extending to abdomen and involving hands as well. Suspect Id reaction. Potentially drug rash although new exposure is just changing from brand to generic nortriptyline. Med changed 1/10 which is consistent with this although a little far out. Patient clinically is well appearing. She does have a temperature 100.3 and is mildly tachycardic though. Given her age and will treat for possible cellulitis and admit.       Raeford Razor, MD 10/31/12 0020

## 2012-10-30 NOTE — ED Notes (Addendum)
Per pt, she experiencing redness and rash on BLE. Sites are swollen and tender/warm to the touch. Pt states this all started on Monday. Pt also has rash on her thighs bilaterally, which started this morning. Pt also c/o itching in rash locations.

## 2012-10-30 NOTE — ED Notes (Signed)
Pt c/o rash and swelling to feet x 1 week. Pt states now rash extends to thighs. Pt denies n/v. + diarrhea.

## 2012-10-31 ENCOUNTER — Encounter (HOSPITAL_COMMUNITY): Payer: Self-pay | Admitting: Internal Medicine

## 2012-10-31 DIAGNOSIS — R21 Rash and other nonspecific skin eruption: Secondary | ICD-10-CM | POA: Diagnosis present

## 2012-10-31 DIAGNOSIS — L309 Dermatitis, unspecified: Secondary | ICD-10-CM | POA: Diagnosis present

## 2012-10-31 DIAGNOSIS — L259 Unspecified contact dermatitis, unspecified cause: Secondary | ICD-10-CM

## 2012-10-31 DIAGNOSIS — R6889 Other general symptoms and signs: Secondary | ICD-10-CM

## 2012-10-31 DIAGNOSIS — E785 Hyperlipidemia, unspecified: Secondary | ICD-10-CM | POA: Diagnosis present

## 2012-10-31 DIAGNOSIS — L02419 Cutaneous abscess of limb, unspecified: Secondary | ICD-10-CM

## 2012-10-31 DIAGNOSIS — L03119 Cellulitis of unspecified part of limb: Secondary | ICD-10-CM

## 2012-10-31 DIAGNOSIS — Z86718 Personal history of other venous thrombosis and embolism: Secondary | ICD-10-CM

## 2012-10-31 DIAGNOSIS — M79609 Pain in unspecified limb: Secondary | ICD-10-CM

## 2012-10-31 LAB — CBC
HCT: 39.2 % (ref 36.0–46.0)
Hemoglobin: 13.7 g/dL (ref 12.0–15.0)
MCH: 32.4 pg (ref 26.0–34.0)
MCHC: 34.9 g/dL (ref 30.0–36.0)
MCV: 92.7 fL (ref 78.0–100.0)
Platelets: 172 10*3/uL (ref 150–400)
RBC: 4.23 MIL/uL (ref 3.87–5.11)
RDW: 12.6 % (ref 11.5–15.5)
WBC: 7 10*3/uL (ref 4.0–10.5)

## 2012-10-31 LAB — BASIC METABOLIC PANEL
BUN: 13 mg/dL (ref 6–23)
CO2: 25 mEq/L (ref 19–32)
Calcium: 9.1 mg/dL (ref 8.4–10.5)
Chloride: 108 mEq/L (ref 96–112)
Creatinine, Ser: 0.94 mg/dL (ref 0.50–1.10)
GFR calc Af Amer: 61 mL/min — ABNORMAL LOW (ref >90)
GFR calc non Af Amer: 53 mL/min — ABNORMAL LOW (ref >90)
Glucose, Bld: 99 mg/dL (ref 70–99)
Potassium: 4.4 mEq/L (ref 3.5–5.1)
Sodium: 141 mEq/L (ref 135–145)

## 2012-10-31 LAB — SEDIMENTATION RATE: Sed Rate: 46 mm/hr — ABNORMAL HIGH (ref 0–22)

## 2012-10-31 MED ORDER — ONDANSETRON HCL 4 MG PO TABS: 4.0000 mg | ORAL_TABLET | Freq: Four times a day (QID) | ORAL | Status: DC | PRN

## 2012-10-31 MED ORDER — VANCOMYCIN HCL 10 G IV SOLR: 1250.0000 mg | INTRAVENOUS | Status: DC

## 2012-10-31 MED ORDER — CYCLOBENZAPRINE HCL 10 MG PO TABS
10.0000 mg | ORAL_TABLET | Freq: Two times a day (BID) | ORAL | Status: DC | PRN
  Filled 2012-10-31: qty 1

## 2012-10-31 MED ORDER — SIMVASTATIN 20 MG PO TABS
20.0000 mg | ORAL_TABLET | Freq: Every day | ORAL | Status: DC
  Filled 2012-10-31: qty 1

## 2012-10-31 MED ORDER — NYSTATIN 100000 UNIT/GM EX POWD
Freq: Two times a day (BID) | CUTANEOUS | Status: DC
  Filled 2012-10-31: qty 15

## 2012-10-31 MED ORDER — ONDANSETRON HCL 4 MG/2ML IJ SOLN: 4.0000 mg | Freq: Four times a day (QID) | INTRAMUSCULAR | Status: DC | PRN

## 2012-10-31 MED ORDER — METHYLPREDNISOLONE SODIUM SUCC 40 MG IJ SOLR
40.0000 mg | Freq: Once | INTRAMUSCULAR | Status: AC
  Administered 2012-10-31: 40 mg via INTRAVENOUS
  Filled 2012-10-31: qty 1

## 2012-10-31 MED ORDER — ACETAMINOPHEN 325 MG PO TABS: 650.0000 mg | ORAL_TABLET | Freq: Four times a day (QID) | ORAL | Status: DC | PRN

## 2012-10-31 MED ORDER — HYDROCORTISONE 0.5 % EX CREA: TOPICAL_CREAM | Freq: Two times a day (BID) | CUTANEOUS | Status: DC

## 2012-10-31 MED ORDER — ONDANSETRON HCL 4 MG/2ML IJ SOLN: 4.0000 mg | Freq: Three times a day (TID) | INTRAMUSCULAR | Status: DC | PRN

## 2012-10-31 MED ORDER — ENOXAPARIN SODIUM 40 MG/0.4ML ~~LOC~~ SOLN
40.0000 mg | SUBCUTANEOUS | Status: DC
  Administered 2012-10-31: 40 mg via SUBCUTANEOUS
  Filled 2012-10-31: qty 0.4

## 2012-10-31 MED ORDER — PREDNISONE 50 MG PO TABS
60.0000 mg | ORAL_TABLET | Freq: Every day | ORAL | Status: DC
  Filled 2012-10-31 (×2): qty 1

## 2012-10-31 MED ORDER — ACETAMINOPHEN 650 MG RE SUPP: 650.0000 mg | Freq: Four times a day (QID) | RECTAL | Status: DC | PRN

## 2012-10-31 MED ORDER — DIPHENHYDRAMINE HCL 50 MG/ML IJ SOLN
25.0000 mg | Freq: Once | INTRAMUSCULAR | Status: AC
  Administered 2012-10-31: 25 mg via INTRAVENOUS
  Filled 2012-10-31: qty 1

## 2012-10-31 MED ORDER — LORATADINE 10 MG PO TABS: 10.0000 mg | ORAL_TABLET | Freq: Every day | ORAL | Status: DC

## 2012-10-31 MED ORDER — SODIUM CHLORIDE 0.9 % IV SOLN
INTRAVENOUS | Status: DC
  Administered 2012-10-31: 06:00:00 via INTRAVENOUS

## 2012-10-31 MED ORDER — NYSTATIN 100000 UNIT/GM EX POWD: CUTANEOUS | Status: DC

## 2012-10-31 MED ORDER — PREDNISONE (PAK) 10 MG PO TABS: ORAL_TABLET | ORAL | Status: DC

## 2012-10-31 NOTE — Progress Notes (Signed)
Patient has been discharged home, all discharge medications and instructions reviewed and questions answered.  P to be assisted to vehicle by wheelchair.

## 2012-10-31 NOTE — Discharge Summary (Addendum)
Physician Discharge Summary  Julia Robbins:[id number] DOB: 25-Mar-1923 DOA: 10/30/2012  PCP: Georgianne Fick, MD  Admit date: 10/30/2012 Discharge date: 10/31/2012  Recommendations for Outpatient Follow-up:  1. Followup with primary care physician in one week for reevaluation of rash. Consider referral to an allergist. 2. Followup final blood cultures.  Discharge Diagnoses:  Principal Problem:  *Rash and nonspecific skin eruption Active Problems:   NEOPLASM, MALIGNANT, COLON, HX OF   History of DVT (deep vein thrombosis)   Hyperlipidemia   Discharge Condition: Improved.  Diet recommendation: Low-sodium, heart healthy.  History of present illness:  Julia Robbins is an 79 year old woman with a past medical history of colon cancer status post hemicolectomy and prior chemotherapy, history of DVT now off Coumadin, status post Greenfield filter who was admitted to the hospital 10/31/2012 with complaints of a skin rash under her breasts and to her lower extremities and hands. She was admitted for further evaluation of her skin findings.  Hospital Course by problem:  Principal Problem:  *Rash and nonspecific skin eruption The patient was placed on empiric vancomycin to cover the possibility of cellulitis. Blood cultures were also drawn and are pending at this time. Given the fact that her skin eruption is in multiple areas including under the breast, on the hands, and feet, it is unlikely that this represents cellulitis and therefore vancomycin will be discontinued. The patient's sedimentation rate was elevated at 46.  Patient was given one dose of Solu-Medrol and placed on Benadryl. She'll be discharged on a prednisone Dosepak, daily Claritin, and hydrocortisone cream to use as needed. Active Problems:  NEOPLASM, MALIGNANT, COLON, HX OF  Stable. Diagnosed July 1998. Disease-free status post chemotherapy. Under the care of Dr. Arline Asp. History of DVT (deep vein thrombosis)  Doppler  studies done to rule out recurrent DVT which were negative. Hyperlipidemia  Continue statin.  Procedures:  Bilateral lower extremity venous duplex 10/31/2012: No evidence of DVT, superficial thrombosis or Baker's cyst.  Consultations:  None.  Discharge Exam: Filed Vitals:   10/31/12 0532  BP: 115/67  Pulse: 78  Temp: 97.9 F (36.6 C)  Resp: 18   Filed Vitals:   10/31/12 0100 10/31/12 0220 10/31/12 0231 10/31/12 0532  BP: 119/73 121/59  115/67  Pulse:  81  78  Temp:  98.1 F (36.7 C)  97.9 F (36.6 C)  TempSrc:  Oral  Oral  Resp: 22 20  18   Height:  5\' 5"  (1.651 m)    Weight:  81.647 kg (180 lb) 83.144 kg (183 lb 4.8 oz)   SpO2: 98% 99%  98%    Gen:  NAD Cardiovascular:  RRR, No M/R/G Respiratory: Lungs CTAB Gastrointestinal: Abdomen soft, NT/ND with normal active bowel sounds. Extremities: No C/E/C   Discharge Instructions      Discharge Orders    Future Appointments: Provider: Department: Dept Phone: Center:   03/27/2013 10:30 AM Mauri Brooklyn [facility] MEDICAL ONCOLOGY [phone] None   03/27/2013 11:00 AM Samul Dada, MD [facility] MEDICAL ONCOLOGY [phone] None     Future Orders Please Complete By Expires   Diet - low sodium heart healthy      Increase activity slowly      Discharge instructions      Comments:   Your rash is being caused by an allergic reaction. This could be due to something in your environment or medication change. You have been prescribed medications to make the rash better. If you continue to have a  problem with the rash, you likely will need to be evaluated by an allergist to do allergy testing. Do not scratch your rash. You can use over-the-counter hydrocortisone cream on the itchy spots, if needed.   Call MD for:  temperature >100.4      Call MD for:      Scheduling Instructions:   If your rash worsens.       Medication List     As of 10/31/2012  9:54 AM    TAKE these medications          CELEBREX PO   Take 1 capsule by mouth daily as needed. For pain      cyclobenzaprine 10 MG tablet   Commonly known as: FLEXERIL   Take 10 mg by mouth 2 (two) times daily as needed. For muscle spasm      furosemide 40 MG tablet   Commonly known as: LASIX   Take 80 mg by mouth every morning.      hydrocortisone cream 0.5 %   Apply topically 2 (two) times daily. Apply to itchy spots on feet and hands as needed.      loratadine 10 MG tablet   Commonly known as: CLARITIN   Take 1 tablet (10 mg total) by mouth daily.      MAGNESIUM-ZINC PO   Take 1 tablet by mouth every morning.      nortriptyline 10 MG capsule   Commonly known as: PAMELOR   Take 10 mg by mouth 2 (two) times daily.      nystatin 100000 UNIT/GM Powd   Apply powder underneath the breasts twice a day.      potassium chloride SA 20 MEQ tablet   Commonly known as: K-DUR,KLOR-CON   Take 20 mEq by mouth 2 (two) times daily.      pravastatin 40 MG tablet   Commonly known as: PRAVACHOL   Take 40 mg by mouth every evening.      predniSONE 10 MG tablet   Commonly known as: STERAPRED UNI-PAK   Take 6 tablets on day one, decrease by 1 tablet daily until off.        Follow-up Information    Follow up with [facility], MD. Schedule an appointment as soon as possible for a visit in 1 week. (To reevaluate rash)    Contact information:   [address] [place] [address] [phone]           The results of significant diagnostics from this hospitalization (including imaging, microbiology, ancillary and laboratory) are listed below for reference.    Significant Diagnostic Studies: No results found.  Microbiology: No results found for this or any previous visit (from the past 240 hour(s)).   Labs:  Basic Metabolic Panel:  Lab 10/31/12 9147 10/30/12 2300  NA 141 141  K 4.4 4.1  CL 108 103  CO2 25 30  GLUCOSE 99 97  BUN 13 14  CREATININE 0.94 1.08  CALCIUM 9.1 9.5   MG -- --  PHOS -- --   GFR Estimated Creatinine Clearance: 44.9 ml/min (by C-G formula based on Cr of 0.94).  CBC:  Lab 10/31/12 0400 10/30/12 2300  WBC 7.0 8.5  NEUTROABS -- 4.7  HGB 13.7 13.2  HCT 39.2 39.6  MCV 92.7 93.4  PLT 172 201    Time coordinating discharge: 25 minutes.  Signed:  Mays Paino  Pager [phone] [facility] 10/31/2012, 9:54 AM

## 2012-10-31 NOTE — ED Notes (Signed)
Attempted to call report to floor RN. She/ha was unavailable. Will call back.

## 2012-10-31 NOTE — H&P (Signed)
Julia Robbins is an 79 y.o. female. Patient was seen and examined on October 31, 2012. PCP - Dr. Nicholos Johns.   Chief Complaint: Skin rash. HPI: 79 year-old female with history of colon cancer status post hemicolectomy and chemotherapy in 1999 with history of DVT off Coumadin now presents with complaints of having a rash the lower extremity, under the breasts and hands. Patient started experiencing these rashes week ago which started off in the right lower extremity and gradually started to progress to the left and under the breast and hands. The rash is itchy and sometimes tender. Patient did not have any difficulty breathing and there were no oral lesions. Patient had some subjective feeling of fever chills. Patient's nortriptyline was changed to a generic form on January 10. Otherwise there was no new medications. Denies any insect bites or use of any new detergents or soap. Patient and he was found to be mildly febrile. Otherwise labs are unremarkable. Denies any shortness of breath nausea vomiting productive cough abdominal pain or diarrhea.  Past Medical History  Diagnosis Date  . Cancer     colon  . Greenfield filter in place     Past Surgical History  Procedure Date  . Colon surgery     History reviewed. No pertinent family history. Social History:  reports that she has never smoked. She does not have any smokeless tobacco history on file. She reports that she does not drink alcohol or use illicit drugs.  Allergies: No Known Allergies   (Not in a hospital admission)  Results for orders placed during the hospital encounter of 10/30/12 (from the past 48 hour(s))  CBC WITH DIFFERENTIAL     Status: Abnormal   Collection Time   10/30/12 11:00 PM      Component Value Range Comment   WBC 8.5  4.0 - 10.5 K/uL    RBC 4.24  3.87 - 5.11 MIL/uL    Hemoglobin 13.2  12.0 - 15.0 g/dL    HCT 16.1  09.6 - 04.5 %    MCV 93.4  78.0 - 100.0 fL    MCH 31.1  26.0 - 34.0 pg    MCHC 33.3  30.0  - 36.0 g/dL    RDW 40.9  81.1 - 91.4 %    Platelets 201  150 - 400 K/uL    Neutrophils Relative 56  43 - 77 %    Neutro Abs 4.7  1.7 - 7.7 K/uL    Lymphocytes Relative 25  12 - 46 %    Lymphs Abs 2.1  0.7 - 4.0 K/uL    Monocytes Relative 11  3 - 12 %    Monocytes Absolute 0.9  0.1 - 1.0 K/uL    Eosinophils Relative 8 (*) 0 - 5 %    Eosinophils Absolute 0.6  0.0 - 0.7 K/uL    Basophils Relative 1  0 - 1 %    Basophils Absolute 0.0  0.0 - 0.1 K/uL   BASIC METABOLIC PANEL     Status: Abnormal   Collection Time   10/30/12 11:00 PM      Component Value Range Comment   Sodium 141  135 - 145 mEq/L    Potassium 4.1  3.5 - 5.1 mEq/L    Chloride 103  96 - 112 mEq/L    CO2 30  19 - 32 mEq/L    Glucose, Bld 97  70 - 99 mg/dL    BUN 14  6 - 23 mg/dL  Creatinine, Ser 1.08  0.50 - 1.10 mg/dL    Calcium 9.5  8.4 - 16.1 mg/dL    GFR calc non Af Amer 45 (*) >90 mL/min    GFR calc Af Amer 52 (*) >90 mL/min   ACETAMINOPHEN LEVEL     Status: Normal   Collection Time   10/30/12 11:00 PM      Component Value Range Comment   Acetaminophen (Tylenol), Serum <15.0  10 - 30 ug/mL    No results found.  Review of Systems  Constitutional: Positive for fever.  HENT: Negative.   Eyes: Negative.   Respiratory: Negative.   Cardiovascular: Negative.   Gastrointestinal: Negative.   Genitourinary: Negative.   Musculoskeletal: Negative.   Skin: Positive for rash (BOTH LOWER EXTREMITIES AND UNDER THE BREASTS AND HANDS.).  Neurological: Negative.   Endo/Heme/Allergies: Negative.   Psychiatric/Behavioral: Negative.     Blood pressure 133/89, pulse 110, temperature 100.3 F (37.9 C), temperature source Rectal, resp. rate 20, height 5\' 5"  (1.651 m), weight 81.647 kg (180 lb), SpO2 100.00%. Physical Exam  Constitutional: She is oriented to person, place, and time. She appears well-developed and well-nourished. No distress.  HENT:  Head: Normocephalic and atraumatic.  Right Ear: External ear normal.  Left  Ear: External ear normal.  Nose: Nose normal.  Mouth/Throat: Oropharynx is clear and moist. No oropharyngeal exudate.  Eyes: Conjunctivae normal are normal. Pupils are equal, round, and reactive to light. Right eye exhibits no discharge. Left eye exhibits no discharge. No scleral icterus.  Neck: Normal range of motion. Neck supple.  Cardiovascular: Normal rate and regular rhythm.   Respiratory: Effort normal and breath sounds normal. No respiratory distress. She has no wheezes. She has no rales.  GI: Soft. Bowel sounds are normal. She exhibits no distension. There is no tenderness. There is no rebound.  Musculoskeletal: She exhibits edema and tenderness.  Neurological: She is alert and oriented to person, place, and time.  Skin: She is not diaphoretic.       Rash on both lower extremities and under both breasts. Mild rash on hands.     Assessment/Plan #1. Cellulitis versus dermatitis - at this time patient has been already started on vancomycin which we will continue for possible infective cause. Blood cultures were drawn. I suspect patient may be having an allergic reaction or autoimmune cause. Sedimentation rate has been ordered. I have also ordered one dose of Solu-Medrol and Benadryl. Continue gentle hydration for now. Closely follow clinically may require dermatologist opinion if rash progresses. Since patient has had previous history of DVT and there is mild swelling of the lower extremity we will get Dopplers to rule out DVT. For now I have discontinued nortriptyline which patient was taking for neuropathy pains. Patient also takes Lasix for fluid retention which will be on hold and gently hydrate for now. #2. History of colon cancer status post hemicolectomy and chemotherapy. #3. History of hyperlipidemia.  CODE STATUS - full code.  Larsen Zettel N. 10/31/2012, 12:54 AM

## 2012-10-31 NOTE — Progress Notes (Signed)
ANTIBIOTIC CONSULT NOTE - INITIAL  Pharmacy Consult for Vancomycin Indication: Cellulitis  No Known Allergies  Patient Measurements: Height: 5\' 5"  (165.1 cm) Weight: 183 lb 4.8 oz (83.144 kg) IBW/kg (Calculated) : 57    Vital Signs: Temp: 98.1 F (36.7 C) (01/30 0220) Temp src: Oral (01/30 0220) BP: 121/59 mmHg (01/30 0220) Pulse Rate: 81  (01/30 0220) Intake/Output from previous day:   Intake/Output from this shift:    Labs:  Basename 10/30/12 2300  WBC 8.5  HGB 13.2  PLT 201  LABCREA --  CREATININE 1.08   Estimated Creatinine Clearance: 39 ml/min (by C-G formula based on Cr of 1.08). No results found for this basename: VANCOTROUGH:2,VANCOPEAK:2,VANCORANDOM:2,GENTTROUGH:2,GENTPEAK:2,GENTRANDOM:2,TOBRATROUGH:2,TOBRAPEAK:2,TOBRARND:2,AMIKACINPEAK:2,AMIKACINTROU:2,AMIKACIN:2, in the last 72 hours   Microbiology: No results found for this or any previous visit (from the past 720 hour(s)).  Medical History: Past Medical History  Diagnosis Date  . Cancer     colon  . Greenfield filter in place     Medications:  Scheduled:    . [COMPLETED] acetaminophen  650 mg Oral Once  . [COMPLETED] diphenhydrAMINE  25 mg Intravenous Once  . [COMPLETED] diphenhydrAMINE  25 mg Intravenous Once  . enoxaparin (LOVENOX) injection  40 mg Subcutaneous Q24H  . [COMPLETED] methylPREDNISolone (SOLU-MEDROL) injection  40 mg Intravenous Once  . simvastatin  20 mg Oral q1800  . [COMPLETED] sodium chloride  500 mL Intravenous Once  . [COMPLETED] vancomycin  1,000 mg Intravenous Once   Infusions:    . sodium chloride     Assessment: 79 yo c/o skin rash.  MD unsure if cellulitis vs. Dermatitis. Pt started on Vancomycin in ER, MD continuing per Rx.  Goal of Therapy:  Vancomycin trough level 10-15 mcg/ml  Plan:   Vancomycin 1250mg  IV q24h.  CrCl ~41 (N) Rec 1Gm X1 in ER ~2312  F/U SCr/levels as needed.  Susanne Greenhouse R 10/31/2012,2:32 AM

## 2012-10-31 NOTE — Care Management (Signed)
Cm spoke with patient concerning dc planning. Per pt currently living alone in [facility] community. Ambulates with cane. Family assist with home care. Medications are delivered to residence. No HH or DME needs stated.   Roxy Manns Dellar Traber,RN,BSN [phone]

## 2012-10-31 NOTE — Progress Notes (Signed)
VASCULAR LAB PRELIMINARY  PRELIMINARY  PRELIMINARY  PRELIMINARY  Bilateral lower extremity venous duplex completed.    Preliminary report:  Bilateral:  No evidence of DVT, superficial thrombosis, or Baker's Cyst.   Princess Karnes, RVS 10/31/2012, 8:36 AM

## 2012-11-06 LAB — CULTURE, BLOOD (ROUTINE X 2)
Culture: NO GROWTH
Culture: NO GROWTH

## 2012-11-30 ENCOUNTER — Emergency Department (HOSPITAL_COMMUNITY)
Admission: EM | Admit: 2012-11-30 | Discharge: 2012-11-30 | Disposition: A | Discharge status: Home / Self Care | Payer: Medicare Other | Attending: Emergency Medicine | Admitting: Emergency Medicine

## 2012-11-30 DIAGNOSIS — L02419 Cutaneous abscess of limb, unspecified: Secondary | ICD-10-CM | POA: Insufficient documentation

## 2012-11-30 DIAGNOSIS — M79609 Pain in unspecified limb: Secondary | ICD-10-CM

## 2012-11-30 DIAGNOSIS — L03115 Cellulitis of right lower limb: Secondary | ICD-10-CM

## 2012-11-30 DIAGNOSIS — L03116 Cellulitis of left lower limb: Secondary | ICD-10-CM

## 2012-11-30 DIAGNOSIS — M79604 Pain in right leg: Secondary | ICD-10-CM

## 2012-11-30 DIAGNOSIS — Z85038 Personal history of other malignant neoplasm of large intestine: Secondary | ICD-10-CM | POA: Insufficient documentation

## 2012-11-30 DIAGNOSIS — L03119 Cellulitis of unspecified part of limb: Secondary | ICD-10-CM | POA: Insufficient documentation

## 2012-11-30 DIAGNOSIS — Z9889 Other specified postprocedural states: Secondary | ICD-10-CM | POA: Insufficient documentation

## 2012-11-30 DIAGNOSIS — R509 Fever, unspecified: Secondary | ICD-10-CM | POA: Insufficient documentation

## 2012-11-30 DIAGNOSIS — M79605 Pain in left leg: Secondary | ICD-10-CM

## 2012-11-30 DIAGNOSIS — R21 Rash and other nonspecific skin eruption: Secondary | ICD-10-CM | POA: Insufficient documentation

## 2012-11-30 LAB — CBC WITH DIFFERENTIAL/PLATELET
Basophils Absolute: 0 10*3/uL (ref 0.0–0.1)
Basophils Relative: 0 % (ref 0–1)
Eosinophils Absolute: 0.2 10*3/uL (ref 0.0–0.7)
Eosinophils Relative: 2 % (ref 0–5)
HCT: 38.7 % (ref 36.0–46.0)
Hemoglobin: 13 g/dL (ref 12.0–15.0)
Lymphocytes Relative: 17 % (ref 12–46)
Lymphs Abs: 1.6 10*3/uL (ref 0.7–4.0)
MCH: 31.2 pg (ref 26.0–34.0)
MCHC: 33.6 g/dL (ref 30.0–36.0)
MCV: 92.8 fL (ref 78.0–100.0)
Monocytes Absolute: 1.1 10*3/uL — ABNORMAL HIGH (ref 0.1–1.0)
Monocytes Relative: 11 % (ref 3–12)
Neutro Abs: 6.8 10*3/uL (ref 1.7–7.7)
Neutrophils Relative %: 70 % (ref 43–77)
Platelets: 281 10*3/uL (ref 150–400)
RBC: 4.17 MIL/uL (ref 3.87–5.11)
RDW: 12.6 % (ref 11.5–15.5)
WBC: 9.7 10*3/uL (ref 4.0–10.5)

## 2012-11-30 LAB — BASIC METABOLIC PANEL
BUN: 11 mg/dL (ref 6–23)
CO2: 27 mEq/L (ref 19–32)
Calcium: 9.3 mg/dL (ref 8.4–10.5)
Chloride: 103 mEq/L (ref 96–112)
Creatinine, Ser: 0.94 mg/dL (ref 0.50–1.10)
GFR calc Af Amer: 61 mL/min — ABNORMAL LOW (ref >90)
GFR calc non Af Amer: 53 mL/min — ABNORMAL LOW (ref >90)
Glucose, Bld: 90 mg/dL (ref 70–99)
Potassium: 3.9 mEq/L (ref 3.5–5.1)
Sodium: 138 mEq/L (ref 135–145)

## 2012-11-30 LAB — LACTIC ACID, PLASMA: Lactic Acid, Venous: 1.1 mmol/L (ref 0.5–2.2)

## 2012-11-30 MED ORDER — SODIUM CHLORIDE 0.9 % IV BOLUS (SEPSIS)
1000.0000 mL | Freq: Once | INTRAVENOUS | Status: AC
  Administered 2012-11-30: 1000 mL via INTRAVENOUS

## 2012-11-30 MED ORDER — OXYCODONE-ACETAMINOPHEN 5-325 MG PO TABS: 2.0000 | ORAL_TABLET | ORAL | Status: DC | PRN

## 2012-11-30 MED ORDER — CLINDAMYCIN PHOSPHATE 600 MG/50ML IV SOLN
600.0000 mg | Freq: Once | INTRAVENOUS | Status: AC
  Administered 2012-11-30: 600 mg via INTRAVENOUS
  Filled 2012-11-30: qty 50

## 2012-11-30 MED ORDER — BACITRACIN-NEOMYCIN-POLYMYXIN 400-5-5000 EX OINT
TOPICAL_OINTMENT | CUTANEOUS | Status: AC
  Filled 2012-11-30: qty 3

## 2012-11-30 MED ORDER — ACETAMINOPHEN 500 MG PO TABS
1000.0000 mg | ORAL_TABLET | Freq: Once | ORAL | Status: AC
  Administered 2012-11-30: 1000 mg via ORAL
  Filled 2012-11-30: qty 2

## 2012-11-30 MED ORDER — MORPHINE SULFATE 4 MG/ML IJ SOLN
4.0000 mg | Freq: Once | INTRAMUSCULAR | Status: AC
  Administered 2012-11-30: 4 mg via INTRAVENOUS
  Filled 2012-11-30: qty 1

## 2012-11-30 NOTE — ED Notes (Signed)
She c/o bilat. Feet and lower leg swelling/discomfort/rash x over 2 weeks.  Hospitalized here briefly for same and "was taken off several of me meds" d/t what they thought was possibly allergic-type reaction. She currently remains on Doxycycline; Loratadine and low-dose Prednisone.  In spite of these interventions, her feet swelling is worsening, with increasing erythema and weeping sores.  Pt. Is in no distress, and is here with her daughter.

## 2012-11-30 NOTE — ED Provider Notes (Signed)
History     CSN: [id number]  Arrival date & time 11/30/12  1157   First MD Initiated Contact with Patient 11/30/12 1213      Chief Complaint  Patient presents with  . Foot Pain    (Consider location/radiation/quality/duration/timing/severity/associated sxs/prior treatment) The history is provided by the patient.  Julia Robbins is a 79 y.o. female history colon cancer status post colectomy, DVT on Coumadin but has a Greenfield filter here presenting with bilateral leg pain and rash. Rash started a month ago. She was diagnosed with possible allergic rash versus cellulitis and was admitted for antibiotics. She then had negative DVT studies bilaterally and negative blood cultures and was thought to have allergic reaction so was placed on prednisone and Benadryl prn. 4 days ago went to see PMD and was placed on doxycycline but however the rash is not improving. She also has some low-grade temp at home. Denies any cough or belly pain or urinary symptoms. She said that the rash is worse than before.    Past Medical History  Diagnosis Date  . Cancer     colon  . Greenfield filter in place     Past Surgical History  Procedure Laterality Date  . Colon surgery      No family history on file.  History  Substance Use Topics  . Smoking status: Never Smoker   . Smokeless tobacco: Not on file  . Alcohol Use: No    OB History   Grav Para Term Preterm Abortions TAB SAB Ect Mult Living                  Review of Systems  Constitutional: Positive for fever.  Skin: Positive for rash.  All other systems reviewed and are negative.    Allergies  Review of patient's allergies indicates no known allergies.  Home Medications   Current Outpatient Rx  Name  Route  Sig  Dispense  Refill  . doxycycline (VIBRA-TABS) 100 MG tablet   Oral   Take 100 mg by mouth 2 (two) times daily.         . predniSONE (STERAPRED UNI-PAK) 10 MG tablet   Oral   Take 10-60 mg by mouth daily. Take 6  tablets on day one, decrease by 1 tablet daily until off.           BP 140/73  Pulse 103  Temp(Src) 100.4 F (38 C) (Oral)  Resp 18  Ht 5\' 5"  (1.651 m)  Wt 183 lb (83.008 kg)  BMI 30.45 kg/m2  SpO2 100%  Physical Exam  Nursing note and vitals reviewed. Constitutional: She is oriented to person, place, and time.  Uncomfortable   HENT:  Head: Normocephalic.  Mouth/Throat: Oropharynx is clear and moist.  Eyes: Conjunctivae are normal. Pupils are equal, round, and reactive to light.  Neck: Normal range of motion. Neck supple.  Cardiovascular: Normal rate, regular rhythm and normal heart sounds.   Pulmonary/Chest: Effort normal and breath sounds normal. No respiratory distress. She has no wheezes. She has no rales.  Abdominal: Soft. Bowel sounds are normal. She exhibits no distension. There is no tenderness. There is no rebound.  Musculoskeletal:  Bilateral lower extremities with dark skin in leg. Multiple scabs with clear drainage. + tender with touch. Minimal bilateral calf tenderness. The area is warm and tender. Diminished pulses bilaterally   Neurological: She is alert and oriented to person, place, and time.  Skin: Skin is warm and dry.  Psychiatric: She has a  normal mood and affect. Her behavior is normal. Judgment and thought content normal.    ED Course  Procedures (including critical care time)  Labs Reviewed  CBC WITH DIFFERENTIAL - Abnormal; Notable for the following:    Monocytes Absolute 1.1 (*)    All other components within normal limits  BASIC METABOLIC PANEL - Abnormal; Notable for the following:    GFR calc non Af Amer 53 (*)    GFR calc Af Amer 61 (*)    All other components within normal limits  CULTURE, BLOOD (ROUTINE X 2)  CULTURE, BLOOD (ROUTINE X 2)  LACTIC ACID, PLASMA  URINALYSIS, ROUTINE W REFLEX MICROSCOPIC   No results found.   No diagnosis found.    MDM  Julia Robbins is a 79 y.o. female here with recurrent cellulitis in bilateral  lower extremities. I think she may have recurrent cellulitis from poor circulation. Given recent US venous duplex showed no DVT, won't need to repeat it. Will get arterial duplex to r/o PAD. Will repeat labs and give IV clinda for cellulitis.   3:18 PM US showed nl arterial and venous flow. No DVT on prelim as well. Labs nl, including nl WBC count and nl lactate. I think that she may have a rheumatologic condition that should be worked up outpatient. Recommend continuing doxycycline and f/u with rheumatologist. Will give short course of percocet prn pain.       Richardean Canal, MD 11/30/12 1520

## 2012-11-30 NOTE — Progress Notes (Signed)
VASCULAR LAB PRELIMINARY  PRELIMINARY  PRELIMINARY  PRELIMINARY  Bilateral lower extremity arterial duplex completed.    Preliminary report:  There is no significant areas of stenosis noted throughout the bilateral lower extremities.  Waveforms are biphasic throughout the common femoral, femoral, and popliteal arteries and strong monophasic waveforms noted in the posterior tibial and anterior tibial arteries. Incidentally, there is no DVT noted, bilaterally.   Thais Silberstein, RVT 11/30/2012, 3:14 PM

## 2012-11-30 NOTE — ED Notes (Signed)
Ultrasound in room with pt

## 2012-12-06 LAB — CULTURE, BLOOD (ROUTINE X 2)
Culture: NO GROWTH
Culture: NO GROWTH

## 2013-01-31 ENCOUNTER — Other Ambulatory Visit (HOSPITAL_COMMUNITY): Payer: Self-pay | Admitting: Internal Medicine

## 2013-01-31 ENCOUNTER — Ambulatory Visit (HOSPITAL_COMMUNITY)
Admission: RE | Admit: 2013-01-31 | Discharge: 2013-01-31 | Disposition: A | Discharge status: Home / Self Care | Payer: Medicare Other | Source: Ambulatory Visit | Attending: Internal Medicine | Admitting: Internal Medicine

## 2013-01-31 DIAGNOSIS — M7989 Other specified soft tissue disorders: Secondary | ICD-10-CM | POA: Insufficient documentation

## 2013-01-31 DIAGNOSIS — M25561 Pain in right knee: Secondary | ICD-10-CM

## 2013-01-31 DIAGNOSIS — M79609 Pain in unspecified limb: Secondary | ICD-10-CM | POA: Insufficient documentation

## 2013-01-31 DIAGNOSIS — Z86718 Personal history of other venous thrombosis and embolism: Secondary | ICD-10-CM | POA: Insufficient documentation

## 2013-03-05 ENCOUNTER — Telehealth: Payer: Self-pay | Admitting: *Deleted

## 2013-03-05 NOTE — Telephone Encounter (Signed)
Pt called to cancel her appt for 03/27/13. gv appt for 04/18/13@ 11am..the patient is aware.Marland Kitchentd

## 2013-03-14 ENCOUNTER — Telehealth: Payer: Self-pay | Admitting: Oncology

## 2013-03-14 NOTE — Telephone Encounter (Signed)
Moved 7/18 appt to covering provider due to DM's departure. lmonvm for pt re appt for f/u 7/18 @ 9:30am and 6/26 for lb @ 9am.

## 2013-03-27 ENCOUNTER — Other Ambulatory Visit (HOSPITAL_BASED_OUTPATIENT_CLINIC_OR_DEPARTMENT_OTHER): Payer: Medicare Other | Admitting: Lab

## 2013-03-27 ENCOUNTER — Ambulatory Visit: Payer: Medicare Other | Admitting: Oncology

## 2013-03-27 DIAGNOSIS — Z85038 Personal history of other malignant neoplasm of large intestine: Secondary | ICD-10-CM

## 2013-03-27 LAB — COMPREHENSIVE METABOLIC PANEL (CC13)
ALT: 9 U/L (ref 0–55)
AST: 15 U/L (ref 5–34)
Albumin: 3.3 g/dL — ABNORMAL LOW (ref 3.5–5.0)
Alkaline Phosphatase: 54 U/L (ref 40–150)
BUN: 13.8 mg/dL (ref 7.0–26.0)
CO2: 27 mEq/L (ref 22–29)
Calcium: 9.6 mg/dL (ref 8.4–10.4)
Chloride: 104 mEq/L (ref 98–109)
Creatinine: 1.1 mg/dL (ref 0.6–1.1)
Glucose: 91 mg/dl (ref 70–140)
Potassium: 4.2 mEq/L (ref 3.5–5.1)
Sodium: 140 mEq/L (ref 136–145)
Total Bilirubin: 0.73 mg/dL (ref 0.20–1.20)
Total Protein: 7.6 g/dL (ref 6.4–8.3)

## 2013-03-27 LAB — CBC WITH DIFFERENTIAL/PLATELET
BASO%: 0.6 % (ref 0.0–2.0)
Basophils Absolute: 0 10*3/uL (ref 0.0–0.1)
EOS%: 5.2 % (ref 0.0–7.0)
Eosinophils Absolute: 0.3 10*3/uL (ref 0.0–0.5)
HCT: 40.9 % (ref 34.8–46.6)
HGB: 13.5 g/dL (ref 11.6–15.9)
LYMPH%: 33.9 % (ref 14.0–49.7)
MCH: 30.4 pg (ref 25.1–34.0)
MCHC: 33 g/dL (ref 31.5–36.0)
MCV: 92.1 fL (ref 79.5–101.0)
MONO#: 0.6 10*3/uL (ref 0.1–0.9)
MONO%: 9.9 % (ref 0.0–14.0)
NEUT#: 3.1 10*3/uL (ref 1.5–6.5)
NEUT%: 50.4 % (ref 38.4–76.8)
Platelets: 207 10*3/uL (ref 145–400)
RBC: 4.44 10*6/uL (ref 3.70–5.45)
RDW: 13.2 % (ref 11.2–14.5)
WBC: 6.2 10*3/uL (ref 3.9–10.3)
lymph#: 2.1 10*3/uL (ref 0.9–3.3)

## 2013-03-27 LAB — LACTATE DEHYDROGENASE (CC13): LDH: 224 U/L (ref 125–245)

## 2013-04-02 ENCOUNTER — Encounter (HOSPITAL_BASED_OUTPATIENT_CLINIC_OR_DEPARTMENT_OTHER): Payer: Medicare Other | Attending: General Surgery

## 2013-04-02 DIAGNOSIS — L97809 Non-pressure chronic ulcer of other part of unspecified lower leg with unspecified severity: Secondary | ICD-10-CM | POA: Insufficient documentation

## 2013-04-02 DIAGNOSIS — M1990 Unspecified osteoarthritis, unspecified site: Secondary | ICD-10-CM | POA: Insufficient documentation

## 2013-04-02 DIAGNOSIS — Z79899 Other long term (current) drug therapy: Secondary | ICD-10-CM | POA: Insufficient documentation

## 2013-04-02 DIAGNOSIS — I872 Venous insufficiency (chronic) (peripheral): Secondary | ICD-10-CM | POA: Insufficient documentation

## 2013-04-03 NOTE — Progress Notes (Signed)
[facility]  NAME:  Julia Robbins, Julia Robbins               ACCOUNT NO.:  [id number]  MEDICAL RECORD NO.:  [id number]      DATE OF BIRTH:  14-Dec-1922  PHYSICIAN:  Ardath Sax, M.D.           VISIT DATE:                                  OFFICE VISIT   This is an 79 year old lady who has been having great problems with ulcers on both of her lower legs.  The worst ulcers about 2 cm in diameter.  It is quite deep, going down the subcu and contains purulent devitalized tissue.  I was able to debride this out fairly well.  Some of the others on the lateral aspect of her right leg, she did not tolerate it well.  I did get the debride some of them, but certainly were going to need to use Santyl to help clean these up.  So, we put her in Kerlix and Coban with Santyl and gave her prescription for Santyl. She has many medical problems including osteoarthritis, for which, she takes a lot of tramadol and oxycodone.  She says that she has been told she has peripheral neuropathy.  Today on examination, she weighs 180 pounds.  Her blood pressure 124/80, heart rate 80, temperature 98.  She is on tramadol, oxycodone, Pravachol, nortriptyline, potassium chloride, Lasix, ferrous sulfate, magnesium and vitamins.  She has had many surgeries before including a colectomy for carcinoma of the colon.  She also has IVC filter. Apparently, she had a pulmonary embolus and she has had bilateral cataracts removed.  So, I diagnosed her really as venous stasis ulcers, and she will need further debridement.  We will try to clean these up with Santyl and we will put compression boots on her and she will come back here in a week.     Ardath Sax, M.D.     PP/MEDQ  D:  04/02/2013  T:  04/03/2013  Job:  [id number]

## 2013-04-18 ENCOUNTER — Ambulatory Visit: Payer: Medicare Other | Admitting: Lab

## 2013-04-18 ENCOUNTER — Telehealth: Payer: Self-pay | Admitting: Hematology and Oncology

## 2013-04-18 ENCOUNTER — Ambulatory Visit (HOSPITAL_BASED_OUTPATIENT_CLINIC_OR_DEPARTMENT_OTHER): Payer: Medicare Other | Admitting: Hematology and Oncology

## 2013-04-18 VITALS — BP 136/88 | HR 98 | Temp 98.8°F | Resp 20 | Ht 65.0 in | Wt 178.2 lb

## 2013-04-18 DIAGNOSIS — Z85038 Personal history of other malignant neoplasm of large intestine: Secondary | ICD-10-CM

## 2013-04-18 LAB — CEA: CEA: 1 ng/mL (ref 0.0–5.0)

## 2013-04-18 NOTE — Telephone Encounter (Signed)
gv and printed appt sched and avs for pt...sent pt to lab    °

## 2013-04-18 NOTE — Progress Notes (Signed)
CC:   Georgianne Fick, M.D. Lorne Skeens. Hoxworth, M.D.  PROBLEM LIST: 1. 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  2/15 lymph nodes were positive.  The patient     underwent colectomy and adjuvant chemotherapy with 5-FU and     leucovorin.  Chemotherapy treatments were completed in February     1999.  The patient has remained disease free. 2. DVT involving the right lower extremity in August 1998 at the time     of presentation of her colon cancer.  An inferior vena cava     Greenfield filter was placed.  The patient is no longer on     Coumadin. 3. History of gastric antral ulcer, H pylori positive August 1998. 4. History of cholelithiasis. 5. History of diverticulosis. 6. Dyslipidemia. 7. Degenerative arthritis. 8. Systolic ejection murmur.  MEDICATIONS: 1. Lasix 40 mg daily. 2. Iron complex 1 three times a week. 3. Magnesium 1 daily. 4. Skelaxin 800 mg 3 times daily. 5. Pamelor 10 mg at bedtime. 6. K-Dur 20 mEq twice daily. 7. Pravachol 40 mg daily.  HISTORY: We am seeing Julia Robbins for followup of her stage III adenocarcinoma of the hepatic flexure dating back to July of 1998, i.e. 15 years ago.  The patient comes back and sees [place] on a yearly basis at her request.  She is currently living in assisted living.  She does not drive.  She has a daughter and grandson who live in [place].  The patient has done extraordinarily well, is now 79 years old.  She was last seen by [place] on 03/2012.  She denies any medical problems over the past year.  Her only problem is that she has generalized stiffness and occasionally some swelling of her legs.  She denies any change of bowel habit, any blood in her stools.  She had seen Dr. Rob Bunting 2 years ago and he felt that repeat colonoscopy was not indicated in view of her age.  Blood pressure 136/88, pulse 98, temperature 98.8 F (37.1 C), temperature source Oral, resp. rate 20,  height 5\' 5"  (1.651 m), weight 178 lb 3.2 oz (80.831 kg). PHYSICAL EXAM:  Julia Robbins looks well.  There have been no major changes in her condition although she has lost a few pounds from a year ago. She uses a cane in her right hand.  She needs some assistance getting up and down from the examining table.   There is no scleral icterus.  She wears glasses.  Mouth and pharynx benign.  No peripheral adenopathy palpable.  Lungs:  Clear to percussion and auscultation.  Cardiac:  Regular rhythm with systolic ejection murmur heard best with the patient supine.  Breasts:  Not examined.  Abdomen:  Benign, somewhat obese, nontender with no organomegaly or masses palpable.  Extremities:  Somewhat tight without actual pitting edema.  Neurologic:  Nonfocal.  LABORATORY DATA:   CBC    Component Value Date/Time   WBC 6.2 03/27/2013 0847   WBC 9.7 11/30/2012 1242   RBC 4.44 03/27/2013 0847   RBC 4.17 11/30/2012 1242   HGB 13.5 03/27/2013 0847   HGB 13.0 11/30/2012 1242   HCT 40.9 03/27/2013 0847   HCT 38.7 11/30/2012 1242   PLT 207 03/27/2013 0847   PLT 281 11/30/2012 1242   MCV 92.1 03/27/2013 0847   MCV 92.8 11/30/2012 1242   MCH 30.4 03/27/2013 0847   MCH 31.2 11/30/2012 1242  MCHC 33.0 03/27/2013 0847   MCHC 33.6 11/30/2012 1242   RDW 13.2 03/27/2013 0847   RDW 12.6 11/30/2012 1242   LYMPHSABS 2.1 03/27/2013 0847   LYMPHSABS 1.6 11/30/2012 1242   MONOABS 0.6 03/27/2013 0847   MONOABS 1.1* 11/30/2012 1242   EOSABS 0.3 03/27/2013 0847   EOSABS 0.2 11/30/2012 1242   BASOSABS 0.0 03/27/2013 0847   BASOSABS 0.0 11/30/2012 1242    Lab Results  Component Value Date   GLUCOSE 91 03/27/2013   BUN 13.8 03/27/2013   CO2 27 03/27/2013   ALT 9 03/27/2013   AST 15 03/27/2013   LDH 224 03/27/2013   K 4.2 03/27/2013   CREATININE 1.1 03/27/2013     IMAGING STUDIES: 1. CT scan of abdomen and pelvis with IV contrast on 10/04/2008 showed     cholelithiasis and stable appearance of the IVC filter. 2. Screening bilateral mammograms  from 04/26/2010 were negative. 3. Right lower extremity venous duplex ultrasound on 01/19/2011 showed     no evidence of a right lower extremity deep venous thrombosis. 4. Digital screening mammogram on 04/28/2011 was negative.  IMPRESSION AND PLAN:  Julia Robbins is now out 16 years from the time of diagnosis of her stage III colon cancer without evidence for recurrence. She continues to do well. Will check CEA level today. At her Request, we will see Julia Robbins in 1 year at which time will check CBC and chemistries.  Zachery Dakins, MD 04/18/2013 9:39 AM

## 2013-04-23 ENCOUNTER — Ambulatory Visit: Payer: Medicare Other | Admitting: Lab

## 2013-05-07 ENCOUNTER — Encounter (HOSPITAL_BASED_OUTPATIENT_CLINIC_OR_DEPARTMENT_OTHER): Payer: Medicare Other | Attending: General Surgery

## 2013-05-07 DIAGNOSIS — L97909 Non-pressure chronic ulcer of unspecified part of unspecified lower leg with unspecified severity: Secondary | ICD-10-CM | POA: Insufficient documentation

## 2013-05-07 DIAGNOSIS — I87319 Chronic venous hypertension (idiopathic) with ulcer of unspecified lower extremity: Secondary | ICD-10-CM | POA: Insufficient documentation

## 2013-06-04 ENCOUNTER — Encounter (HOSPITAL_BASED_OUTPATIENT_CLINIC_OR_DEPARTMENT_OTHER): Payer: Medicare Other | Attending: General Surgery

## 2013-06-04 DIAGNOSIS — I87319 Chronic venous hypertension (idiopathic) with ulcer of unspecified lower extremity: Secondary | ICD-10-CM | POA: Insufficient documentation

## 2013-06-04 DIAGNOSIS — L97909 Non-pressure chronic ulcer of unspecified part of unspecified lower leg with unspecified severity: Secondary | ICD-10-CM | POA: Insufficient documentation

## 2013-07-09 ENCOUNTER — Encounter (HOSPITAL_BASED_OUTPATIENT_CLINIC_OR_DEPARTMENT_OTHER): Payer: Medicare Other | Attending: General Surgery

## 2013-07-09 DIAGNOSIS — I87319 Chronic venous hypertension (idiopathic) with ulcer of unspecified lower extremity: Secondary | ICD-10-CM | POA: Insufficient documentation

## 2013-07-09 DIAGNOSIS — L97909 Non-pressure chronic ulcer of unspecified part of unspecified lower leg with unspecified severity: Secondary | ICD-10-CM | POA: Insufficient documentation

## 2014-04-17 ENCOUNTER — Telehealth: Payer: Self-pay | Admitting: Internal Medicine

## 2014-04-17 ENCOUNTER — Other Ambulatory Visit (HOSPITAL_BASED_OUTPATIENT_CLINIC_OR_DEPARTMENT_OTHER): Payer: Medicare Other

## 2014-04-17 ENCOUNTER — Other Ambulatory Visit: Payer: Self-pay | Admitting: Internal Medicine

## 2014-04-17 ENCOUNTER — Ambulatory Visit (HOSPITAL_BASED_OUTPATIENT_CLINIC_OR_DEPARTMENT_OTHER): Payer: Medicare Other | Admitting: Internal Medicine

## 2014-04-17 ENCOUNTER — Encounter: Payer: Self-pay | Admitting: Internal Medicine

## 2014-04-17 VITALS — BP 135/78 | HR 88 | Temp 98.2°F | Resp 18 | Ht 65.0 in | Wt 172.8 lb

## 2014-04-17 DIAGNOSIS — Z86718 Personal history of other venous thrombosis and embolism: Secondary | ICD-10-CM

## 2014-04-17 DIAGNOSIS — Z85038 Personal history of other malignant neoplasm of large intestine: Secondary | ICD-10-CM

## 2014-04-17 LAB — COMPREHENSIVE METABOLIC PANEL (CC13)
ALT: 14 U/L (ref 0–55)
AST: 17 U/L (ref 5–34)
Albumin: 3.6 g/dL (ref 3.5–5.0)
Alkaline Phosphatase: 59 U/L (ref 40–150)
Anion Gap: 9 mEq/L (ref 3–11)
BUN: 18.9 mg/dL (ref 7.0–26.0)
CO2: 28 mEq/L (ref 22–29)
Calcium: 9.8 mg/dL (ref 8.4–10.4)
Chloride: 106 mEq/L (ref 98–109)
Creatinine: 1.1 mg/dL (ref 0.6–1.1)
Glucose: 93 mg/dl (ref 70–140)
Potassium: 4.5 mEq/L (ref 3.5–5.1)
Sodium: 143 mEq/L (ref 136–145)
Total Bilirubin: 0.77 mg/dL (ref 0.20–1.20)
Total Protein: 7.8 g/dL (ref 6.4–8.3)

## 2014-04-17 LAB — CBC WITH DIFFERENTIAL/PLATELET
BASO%: 1.2 % (ref 0.0–2.0)
Basophils Absolute: 0.1 10*3/uL (ref 0.0–0.1)
EOS%: 5.8 % (ref 0.0–7.0)
Eosinophils Absolute: 0.4 10*3/uL (ref 0.0–0.5)
HCT: 42.7 % (ref 34.8–46.6)
HGB: 14.1 g/dL (ref 11.6–15.9)
LYMPH%: 35.5 % (ref 14.0–49.7)
MCH: 29.9 pg (ref 25.1–34.0)
MCHC: 33.1 g/dL (ref 31.5–36.0)
MCV: 90.4 fL (ref 79.5–101.0)
MONO#: 0.7 10*3/uL (ref 0.1–0.9)
MONO%: 10.9 % (ref 0.0–14.0)
NEUT#: 2.9 10*3/uL (ref 1.5–6.5)
NEUT%: 46.6 % (ref 38.4–76.8)
Platelets: 211 10*3/uL (ref 145–400)
RBC: 4.72 10*6/uL (ref 3.70–5.45)
RDW: 12.9 % (ref 11.2–14.5)
WBC: 6.3 10*3/uL (ref 3.9–10.3)
lymph#: 2.2 10*3/uL (ref 0.9–3.3)

## 2014-04-17 LAB — CEA: CEA: 1.2 ng/mL (ref 0.0–5.0)

## 2014-04-17 NOTE — Progress Notes (Signed)
[facility], [place] [address]  DIAGNOSIS: NEOPLASM, MALIGNANT, COLON, HX OF  History of DVT (deep vein thrombosis)  Chief Complaint  Patient presents with  . NEOPLASM, MALIGNANT, COLON, HX OF    CURRENT TREATMENT: Observation  INTERVAL HISTORY: Julia Robbins 80 y.o. female with a history of stage III  adenocarcinoma of the hepatic flexure dating back to July of 1998 is here for follow up. The patient comes back and sees [place] on a yearly basis at her request. She is currently living in assisted living. She does not drive. She has a daughter and grandson who live in [place]. She reports having ongoing left leg problems. She denies any change of bowel  habit, any blood in her stools. She had seen Dr. Owens Loffler 3 years ago and he felt that repeat colonoscopy was not indicated in view of her age.  MEDICAL HISTORY: Past Medical History  Diagnosis Date  . Cancer     colon  . Greenfield filter in place     INTERIM HISTORY: has NEOPLASM, MALIGNANT, COLON, HX OF; History of DVT (deep vein thrombosis); Hyperlipidemia; and Rash and nonspecific skin eruption on her problem list.    ALLERGIES:  has No Known Allergies.  MEDICATIONS: has a current medication list which includes the following prescription(s): vitamin c, cholecalciferol, cyclobenzaprine, furosemide, hydroxyzine, multivitamin, nortriptyline, oxycodone-acetaminophen, potassium chloride sa, and pravastatin.  SURGICAL HISTORY:  Past Surgical History  Procedure Laterality Date  . Colon surgery     PROBLEM LIST:  1. Adenocarcinoma of the hepatic flexure dating back to July 1998, T3 N1, stage III. 2/15 lymph nodes were positive. The patient underwent colectomy and adjuvant chemotherapy with 5-FU and leucovorin. Chemotherapy treatments were completed in February 1999. The patient has remained disease free.  2. DVT involving the right  lower extremity in August 1998 at the time of presentation of her colon cancer. An inferior vena cava Greenfield filter was placed. The patient is no longer on  Coumadin.  3. History of gastric antral ulcer, H pylori positive August 1998.  4. History of cholelithiasis.  5. History of diverticulosis.  6. Dyslipidemia.  7. Degenerative arthritis.  8. Systolic ejection murmur.  REVIEW OF SYSTEMS:   Constitutional: Denies fevers, chills or abnormal weight loss Eyes: Denies blurriness of vision Ears, nose, mouth, throat, and face: Denies mucositis or sore throat Respiratory: Denies cough, dyspnea or wheezes Cardiovascular: Denies palpitation, chest discomfort or lower extremity swelling Gastrointestinal:  Denies nausea, heartburn or change in bowel habits Skin: Denies abnormal skin rashes Lymphatics: Denies new lymphadenopathy or easy bruising Neurological:Denies numbness, tingling or new weaknesses Behavioral/Psych: Mood is stable, no new changes  All other systems were reviewed with the patient and are negative.  PHYSICAL EXAMINATION: ECOG PERFORMANCE STATUS: 2 - Symptomatic, <50% confined to bed  Blood pressure 135/78, pulse 88, temperature 98.2 F (36.8 C), temperature source Oral, resp. rate 18, height 5\' 5"  (1.651 m), weight 172 lb 12.8 oz (78.382 kg).  GENERAL:alert, no distress and comfortable SKIN: skin color, texture, turgor are normal, no rashes or significant lesions EYES: normal, Conjunctiva are pink and non-injected, sclera clear OROPHARYNX:no exudate, no erythema and lips, buccal mucosa, and tongue normal  NECK: supple, thyroid normal size, non-tender, without nodularity LYMPH:  no palpable lymphadenopathy in the cervical, axillary or supraclavicular LUNGS: clear to auscultation with normal breathing effort, no wheezes or rhonchi HEART: regular rate & rhythm and no murmurs and no  lower extremity edema ABDOMEN:abdomen soft, non-tender and normal bowel  sounds Musculoskeletal:no cyanosis of digits and no clubbing  NEURO: alert & oriented x 3 with fluent speech, no focal motor/sensory deficits  Labs:  Lab Results  Component Value Date   WBC 6.3 04/17/2014   HGB 14.1 04/17/2014   HCT 42.7 04/17/2014   MCV 90.4 04/17/2014   PLT 211 04/17/2014   NEUTROABS 2.9 04/17/2014      Chemistry      Component Value Date/Time   NA 143 04/17/2014 0925   NA 138 11/30/2012 1242   K 4.5 04/17/2014 0925   K 3.9 11/30/2012 1242   CL 103 11/30/2012 1242   CO2 28 04/17/2014 0925   CO2 27 11/30/2012 1242   BUN 18.9 04/17/2014 0925   BUN 11 11/30/2012 1242   CREATININE 1.1 04/17/2014 0925   CREATININE 0.94 11/30/2012 1242      Component Value Date/Time   CALCIUM 9.8 04/17/2014 0925   CALCIUM 9.3 11/30/2012 1242   ALKPHOS 59 04/17/2014 0925   ALKPHOS 54 03/28/2012 1049   AST 17 04/17/2014 0925   AST 24 03/28/2012 1049   ALT 14 04/17/2014 0925   ALT 20 03/28/2012 1049   BILITOT 0.77 04/17/2014 0925   BILITOT 0.6 03/28/2012 1049       Basic Metabolic Panel:  Recent Labs Lab 04/17/14 0925  NA 143  K 4.5  CO2 28  GLUCOSE 93  BUN 18.9  CREATININE 1.1  CALCIUM 9.8   GFR Estimated Creatinine Clearance: 35.9 ml/min (by C-G formula based on Cr of 1.1). Liver Function Tests:  Recent Labs Lab 04/17/14 0925  AST 17  ALT 14  ALKPHOS 59  BILITOT 0.77  PROT 7.8  ALBUMIN 3.6   No results found for this basename: LIPASE, AMYLASE,  in the last 168 hours No results found for this basename: AMMONIA,  in the last 168 hours Coagulation profile No results found for this basename: INR, PROTIME,  in the last 168 hours  CBC:  Recent Labs Lab 04/17/14 0924  WBC 6.3  NEUTROABS 2.9  HGB 14.1  HCT 42.7  MCV 90.4  PLT 211    -- On prior visits, she did see Dr. Eppie Gibson for a radiation oncology consultation on 12/27/2012.  Dr. Isidore Moos was not in favor of palliative radiation; instead recommended a mastectomy if the patient should need that.  Patient is not at all  enthusiastic about having a mastectomy.  Hopefully that will not be necessary.  - Her tumor markers are stable with an overall mild increase.  We will repeat monthly to establish trend. She requests not to repeat her mammogram.  Thus, we will order a CT of chest following two months of therapy with femara plus palbociclib. She will return biweekly for CBC for the first two months of therapy.  She has an appointment to return in 90month and she will have CBC, chemistries, CEA and CA27.29. Anemia work up No results found for this basename: VITAMINB12, FOLATE, FERRITIN, TIBC, IRON, RETICCTPCT,  in the last 72 hours  Studies:  No results found.   RADIOGRAPHIC STUDIES: 1. CT scan of abdomen and pelvis with IV contrast on 10/04/2008 showed  cholelithiasis and stable appearance of the IVC filter.  2. Screening bilateral mammograms from 04/26/2010 were negative.  3. Right lower extremity venous duplex ultrasound on 01/19/2011 showed  no evidence of a right lower extremity deep venous thrombosis.  4. Digital screening mammogram on 04/28/2011 was negative.   ASSESSMENT:  Julia Robbins 80 y.o. female with a history of NEOPLASM, MALIGNANT, COLON, HX OF  History of DVT (deep vein thrombosis)   PLAN:   1. Colon Cancer. --Mrs. Moehring is now out 16 years from the time of  diagnosis of her stage III colon cancer without evidence for recurrence. She continues to do well. CEA is 1 today. At her Request, we will see Mrs. Sandoval again in 1 year at which time will check CBC and chemistries.  All questions were answered. The patient knows to call the clinic with any problems, questions or concerns. We can certainly see the patient much sooner if necessary.  I spent 15 minutes counseling the patient face to face. The total time spent in the appointment was 25 minutes.    Pamela Maddy, MD 04/17/2014 10:39 AM

## 2014-04-17 NOTE — Telephone Encounter (Signed)
Pt confirmed yrly visit, will c/b to schedule when schedule is availabe 04/18/15 per orders, gave pt AVS...Marland KitchenMarland KitchenKJ

## 2014-04-19 ENCOUNTER — Telehealth: Payer: Self-pay | Admitting: Internal Medicine

## 2014-04-19 NOTE — Telephone Encounter (Signed)
lvm for pt regarding to July 2016 appt....mailed pt appt sched/avs and letter

## 2014-10-14 DIAGNOSIS — Z0000 Encounter for general adult medical examination without abnormal findings: Secondary | ICD-10-CM | POA: Diagnosis not present

## 2014-10-14 DIAGNOSIS — Z1389 Encounter for screening for other disorder: Secondary | ICD-10-CM | POA: Diagnosis not present

## 2014-10-14 DIAGNOSIS — M150 Primary generalized (osteo)arthritis: Secondary | ICD-10-CM | POA: Diagnosis not present

## 2014-10-14 DIAGNOSIS — E782 Mixed hyperlipidemia: Secondary | ICD-10-CM | POA: Diagnosis not present

## 2014-10-19 DIAGNOSIS — Z0000 Encounter for general adult medical examination without abnormal findings: Secondary | ICD-10-CM | POA: Diagnosis not present

## 2014-10-19 DIAGNOSIS — L819 Disorder of pigmentation, unspecified: Secondary | ICD-10-CM | POA: Diagnosis not present

## 2014-10-19 DIAGNOSIS — E782 Mixed hyperlipidemia: Secondary | ICD-10-CM | POA: Diagnosis not present

## 2014-10-19 DIAGNOSIS — Z780 Asymptomatic menopausal state: Secondary | ICD-10-CM | POA: Diagnosis not present

## 2014-10-19 DIAGNOSIS — G609 Hereditary and idiopathic neuropathy, unspecified: Secondary | ICD-10-CM | POA: Diagnosis not present

## 2014-12-15 DIAGNOSIS — G609 Hereditary and idiopathic neuropathy, unspecified: Secondary | ICD-10-CM | POA: Diagnosis not present

## 2014-12-15 DIAGNOSIS — E782 Mixed hyperlipidemia: Secondary | ICD-10-CM | POA: Diagnosis not present

## 2014-12-15 DIAGNOSIS — M310 Hypersensitivity angiitis: Secondary | ICD-10-CM | POA: Diagnosis not present

## 2015-02-11 ENCOUNTER — Telehealth: Payer: Self-pay | Admitting: Hematology

## 2015-02-11 NOTE — Telephone Encounter (Signed)
Spoke with patient and she is aware of her new appointment with dr Burr Medico

## 2015-04-14 ENCOUNTER — Other Ambulatory Visit: Payer: Self-pay | Admitting: *Deleted

## 2015-04-14 DIAGNOSIS — Z85038 Personal history of other malignant neoplasm of large intestine: Secondary | ICD-10-CM

## 2015-04-15 ENCOUNTER — Ambulatory Visit (HOSPITAL_BASED_OUTPATIENT_CLINIC_OR_DEPARTMENT_OTHER): Payer: Medicare Other | Admitting: Hematology

## 2015-04-15 ENCOUNTER — Other Ambulatory Visit (HOSPITAL_BASED_OUTPATIENT_CLINIC_OR_DEPARTMENT_OTHER): Payer: Medicare Other

## 2015-04-15 ENCOUNTER — Encounter: Payer: Self-pay | Admitting: Hematology

## 2015-04-15 VITALS — BP 150/87 | HR 99 | Temp 98.8°F | Resp 16 | Ht 65.0 in | Wt 175.2 lb

## 2015-04-15 DIAGNOSIS — Z85038 Personal history of other malignant neoplasm of large intestine: Secondary | ICD-10-CM

## 2015-04-15 LAB — CBC WITH DIFFERENTIAL/PLATELET
BASO%: 0.5 % (ref 0.0–2.0)
Basophils Absolute: 0 10*3/uL (ref 0.0–0.1)
EOS%: 6.7 % (ref 0.0–7.0)
Eosinophils Absolute: 0.4 10*3/uL (ref 0.0–0.5)
HCT: 40.9 % (ref 34.8–46.6)
HGB: 13.8 g/dL (ref 11.6–15.9)
LYMPH%: 29.4 % (ref 14.0–49.7)
MCH: 30.4 pg (ref 25.1–34.0)
MCHC: 33.7 g/dL (ref 31.5–36.0)
MCV: 90.1 fL (ref 79.5–101.0)
MONO#: 0.6 10*3/uL (ref 0.1–0.9)
MONO%: 8.9 % (ref 0.0–14.0)
NEUT#: 3.6 10*3/uL (ref 1.5–6.5)
NEUT%: 54.5 % (ref 38.4–76.8)
Platelets: 186 10*3/uL (ref 145–400)
RBC: 4.54 10*6/uL (ref 3.70–5.45)
RDW: 12.7 % (ref 11.2–14.5)
WBC: 6.5 10*3/uL (ref 3.9–10.3)
lymph#: 1.9 10*3/uL (ref 0.9–3.3)

## 2015-04-15 LAB — COMPREHENSIVE METABOLIC PANEL (CC13)
ALT: 12 U/L (ref 0–55)
AST: 16 U/L (ref 5–34)
Albumin: 3.4 g/dL — ABNORMAL LOW (ref 3.5–5.0)
Alkaline Phosphatase: 64 U/L (ref 40–150)
Anion Gap: 7 mEq/L (ref 3–11)
BUN: 13 mg/dL (ref 7.0–26.0)
CO2: 25 mEq/L (ref 22–29)
Calcium: 9.8 mg/dL (ref 8.4–10.4)
Chloride: 109 mEq/L (ref 98–109)
Creatinine: 0.9 mg/dL (ref 0.6–1.1)
EGFR: 64 mL/min/{1.73_m2} — ABNORMAL LOW (ref >90)
Glucose: 103 mg/dl (ref 70–140)
Potassium: 4.2 mEq/L (ref 3.5–5.1)
Sodium: 141 mEq/L (ref 136–145)
Total Bilirubin: 0.54 mg/dL (ref 0.20–1.20)
Total Protein: 7.4 g/dL (ref 6.4–8.3)

## 2015-04-15 NOTE — Progress Notes (Signed)
[facility], [place] [address]  DIAGNOSIS: History of colon cancer  No chief complaint on file.   CURRENT TREATMENT: Observation  INTERVAL HISTORY: Julia Robbins 81 y.o. female with a history of stage III adenocarcinoma of the hepatic flexure dating back to July of 1998 is here for follow up. The patient comes back and sees [place] on a yearly basis at her request. She is currently living in assisted living, remains to be fairly independent. She was brought in by her daughter today. She is doing well overall, denies any pain, nausea, change of her bowel habits, or any other symptoms.   MEDICAL HISTORY: Past Medical History  Diagnosis Date  . Cancer     colon  . Greenfield filter in place     INTERIM HISTORY: has History of colon cancer; History of DVT (deep vein thrombosis); Hyperlipidemia; and Rash and nonspecific skin eruption on her problem list.    ALLERGIES:  has No Known Allergies.  MEDICATIONS: has a current medication list which includes the following prescription(s): vitamin c, cholecalciferol, cyclobenzaprine, furosemide, hydroxyzine, multivitamin, nortriptyline, oxycodone-acetaminophen, potassium chloride sa, and pravastatin.  SURGICAL HISTORY:  Past Surgical History  Procedure Laterality Date  . Colon surgery     PROBLEM LIST:  1. Adenocarcinoma of the hepatic flexure dating back to July 1998, T3 N1, stage III. 2/15 lymph nodes were positive. The patient underwent colectomy and adjuvant chemotherapy with 5-FU and leucovorin. Chemotherapy treatments were completed in February 1999. The patient has remained disease free.  2. DVT involving the right lower extremity in August 1998 at the time of presentation of her colon cancer. An inferior vena cava Greenfield filter was placed. The patient is no longer on  Coumadin.  3. History of gastric antral ulcer, H pylori positive August  1998.  4. History of cholelithiasis.  5. History of diverticulosis.  6. Dyslipidemia.  7. Degenerative arthritis.  8. Systolic ejection murmur.  REVIEW OF SYSTEMS:   Constitutional: Denies fevers, chills or abnormal weight loss Eyes: Denies blurriness of vision Ears, nose, mouth, throat, and face: Denies mucositis or sore throat Respiratory: Denies cough, dyspnea or wheezes Cardiovascular: Denies palpitation, chest discomfort or lower extremity swelling Gastrointestinal:  Denies nausea, heartburn or change in bowel habits Skin: Denies abnormal skin rashes Lymphatics: Denies new lymphadenopathy or easy bruising Neurological:Denies numbness, tingling or new weaknesses Behavioral/Psych: Mood is stable, no new changes  All other systems were reviewed with the patient and are negative.  PHYSICAL EXAMINATION: ECOG PERFORMANCE STATUS: 2 - Symptomatic, <50% confined to bed  Blood pressure 150/87, pulse 99, temperature 98.8 F (37.1 C), temperature source Oral, resp. rate 16, height 5\' 5"  (1.651 m), weight 175 lb 3.2 oz (79.47 kg), SpO2 98 %.  GENERAL:alert, no distress and comfortable SKIN: skin color, texture, turgor are normal, no rashes or significant lesions EYES: normal, Conjunctiva are pink and non-injected, sclera clear OROPHARYNX:no exudate, no erythema and lips, buccal mucosa, and tongue normal  NECK: supple, thyroid normal size, non-tender, without nodularity LYMPH:  no palpable lymphadenopathy in the cervical, axillary or supraclavicular LUNGS: clear to auscultation with normal breathing effort, no wheezes or rhonchi HEART: regular rate & rhythm and no murmurs and no lower extremity edema ABDOMEN:abdomen soft, non-tender and normal bowel sounds Musculoskeletal:no cyanosis of digits and no clubbing  NEURO: alert & oriented x 3 with fluent speech, no focal motor/sensory deficits  Labs:  Lab Results  Component Value Date  WBC 6.5 04/15/2015   HGB 13.8 04/15/2015   HCT 40.9  04/15/2015   MCV 90.1 04/15/2015   PLT 186 04/15/2015   NEUTROABS 3.6 04/15/2015      Chemistry      Component Value Date/Time   NA 141 04/15/2015 1012   NA 138 11/30/2012 1242   K 4.2 04/15/2015 1012   K 3.9 11/30/2012 1242   CL 103 11/30/2012 1242   CO2 25 04/15/2015 1012   CO2 27 11/30/2012 1242   BUN 13.0 04/15/2015 1012   BUN 11 11/30/2012 1242   CREATININE 0.9 04/15/2015 1012   CREATININE 0.94 11/30/2012 1242      Component Value Date/Time   CALCIUM 9.8 04/15/2015 1012   CALCIUM 9.3 11/30/2012 1242   ALKPHOS 64 04/15/2015 1012   ALKPHOS 54 03/28/2012 1049   AST 16 04/15/2015 1012   AST 24 03/28/2012 1049   ALT 12 04/15/2015 1012   ALT 20 03/28/2012 1049   BILITOT 0.54 04/15/2015 1012   BILITOT 0.6 03/28/2012 1049      RADIOGRAPHIC STUDIES: 1. CT scan of abdomen and pelvis with IV contrast on 10/04/2008 showed  cholelithiasis and stable appearance of the IVC filter.  2. Screening bilateral mammograms from 04/26/2010 were negative.  3. Right lower extremity venous duplex ultrasound on 01/19/2011 showed  no evidence of a right lower extremity deep venous thrombosis.  4. Digital screening mammogram on 04/28/2011 was negative.   ASSESSMENT: Julia Robbins 81 y.o. female with a history of History of colon cancer   PLAN:   1. Colon Cancer. --Mrs. Partch is now out 18 years from the time of diagnosis of her stage III colon cancer without evidence for recurrence. She continues to do well.  -Her exam was unremarkable, today's CEA is still pending.  -We discussed that she has completed cancer surveillance, the risk of cancer recurrence is minimal now. I suggest her to follow-up with her primary care physician only, she agrees. -We will see her on an as-needed basis in the future.  All questions were answered. The patient knows to call the clinic with any problems, questions or concerns. We can certainly see the patient much sooner if necessary.  I spent 15 minutes  counseling the patient face to face. The total time spent in the appointment was 25 minutes.    Truitt Merle, MD 04/15/2015 11:48 AM

## 2015-04-16 ENCOUNTER — Ambulatory Visit: Payer: Medicare Other

## 2015-04-16 ENCOUNTER — Other Ambulatory Visit: Payer: Medicare Other

## 2015-04-16 LAB — CEA: CEA: 1.5 ng/mL (ref 0.0–5.0)

## 2015-05-10 DIAGNOSIS — Z8673 Personal history of transient ischemic attack (TIA), and cerebral infarction without residual deficits: Secondary | ICD-10-CM | POA: Diagnosis not present

## 2015-05-10 DIAGNOSIS — E782 Mixed hyperlipidemia: Secondary | ICD-10-CM | POA: Diagnosis not present

## 2015-05-10 DIAGNOSIS — G609 Hereditary and idiopathic neuropathy, unspecified: Secondary | ICD-10-CM | POA: Diagnosis not present

## 2015-05-17 DIAGNOSIS — G609 Hereditary and idiopathic neuropathy, unspecified: Secondary | ICD-10-CM | POA: Diagnosis not present

## 2015-05-17 DIAGNOSIS — R269 Unspecified abnormalities of gait and mobility: Secondary | ICD-10-CM | POA: Diagnosis not present

## 2015-05-17 DIAGNOSIS — R600 Localized edema: Secondary | ICD-10-CM | POA: Diagnosis not present

## 2015-05-17 DIAGNOSIS — E782 Mixed hyperlipidemia: Secondary | ICD-10-CM | POA: Diagnosis not present

## 2015-11-15 DIAGNOSIS — R600 Localized edema: Secondary | ICD-10-CM | POA: Diagnosis not present

## 2015-11-15 DIAGNOSIS — R269 Unspecified abnormalities of gait and mobility: Secondary | ICD-10-CM | POA: Diagnosis not present

## 2015-11-15 DIAGNOSIS — Z0000 Encounter for general adult medical examination without abnormal findings: Secondary | ICD-10-CM | POA: Diagnosis not present

## 2015-11-15 DIAGNOSIS — E782 Mixed hyperlipidemia: Secondary | ICD-10-CM | POA: Diagnosis not present

## 2015-11-18 DIAGNOSIS — E782 Mixed hyperlipidemia: Secondary | ICD-10-CM | POA: Diagnosis not present

## 2015-11-18 DIAGNOSIS — M150 Primary generalized (osteo)arthritis: Secondary | ICD-10-CM | POA: Diagnosis not present

## 2015-11-18 DIAGNOSIS — R269 Unspecified abnormalities of gait and mobility: Secondary | ICD-10-CM | POA: Diagnosis not present

## 2015-11-24 DIAGNOSIS — M2011 Hallux valgus (acquired), right foot: Secondary | ICD-10-CM | POA: Diagnosis not present

## 2015-11-24 DIAGNOSIS — M2012 Hallux valgus (acquired), left foot: Secondary | ICD-10-CM | POA: Diagnosis not present

## 2015-11-24 DIAGNOSIS — L84 Corns and callosities: Secondary | ICD-10-CM | POA: Diagnosis not present

## 2015-12-01 ENCOUNTER — Encounter (HOSPITAL_COMMUNITY): Payer: Self-pay | Admitting: Emergency Medicine

## 2015-12-01 ENCOUNTER — Inpatient Hospital Stay (HOSPITAL_COMMUNITY)
Admission: EM | Admit: 2015-12-01 | Discharge: 2015-12-04 | DRG: 378 | Disposition: A | Discharge status: Home / Self Care | Payer: Medicare Other | Attending: Internal Medicine | Admitting: Internal Medicine

## 2015-12-01 ENCOUNTER — Emergency Department (HOSPITAL_COMMUNITY): Payer: Medicare Other

## 2015-12-01 DIAGNOSIS — Z85038 Personal history of other malignant neoplasm of large intestine: Secondary | ICD-10-CM | POA: Diagnosis not present

## 2015-12-01 DIAGNOSIS — I129 Hypertensive chronic kidney disease with stage 1 through stage 4 chronic kidney disease, or unspecified chronic kidney disease: Secondary | ICD-10-CM | POA: Diagnosis present

## 2015-12-01 DIAGNOSIS — E869 Volume depletion, unspecified: Secondary | ICD-10-CM | POA: Diagnosis present

## 2015-12-01 DIAGNOSIS — K5791 Diverticulosis of intestine, part unspecified, without perforation or abscess with bleeding: Secondary | ICD-10-CM | POA: Diagnosis not present

## 2015-12-01 DIAGNOSIS — E785 Hyperlipidemia, unspecified: Secondary | ICD-10-CM | POA: Diagnosis present

## 2015-12-01 DIAGNOSIS — E872 Acidosis: Secondary | ICD-10-CM | POA: Diagnosis not present

## 2015-12-01 DIAGNOSIS — K2961 Other gastritis with bleeding: Secondary | ICD-10-CM | POA: Diagnosis not present

## 2015-12-01 DIAGNOSIS — K922 Gastrointestinal hemorrhage, unspecified: Secondary | ICD-10-CM

## 2015-12-01 DIAGNOSIS — K921 Melena: Secondary | ICD-10-CM

## 2015-12-01 DIAGNOSIS — N183 Chronic kidney disease, stage 3 unspecified: Secondary | ICD-10-CM

## 2015-12-01 DIAGNOSIS — N179 Acute kidney failure, unspecified: Secondary | ICD-10-CM | POA: Diagnosis not present

## 2015-12-01 DIAGNOSIS — R05 Cough: Secondary | ICD-10-CM | POA: Diagnosis not present

## 2015-12-01 DIAGNOSIS — Z86718 Personal history of other venous thrombosis and embolism: Secondary | ICD-10-CM | POA: Diagnosis not present

## 2015-12-01 DIAGNOSIS — Z79899 Other long term (current) drug therapy: Secondary | ICD-10-CM

## 2015-12-01 DIAGNOSIS — D62 Acute posthemorrhagic anemia: Secondary | ICD-10-CM | POA: Diagnosis present

## 2015-12-01 DIAGNOSIS — Z8711 Personal history of peptic ulcer disease: Secondary | ICD-10-CM

## 2015-12-01 DIAGNOSIS — K5731 Diverticulosis of large intestine without perforation or abscess with bleeding: Secondary | ICD-10-CM | POA: Diagnosis not present

## 2015-12-01 DIAGNOSIS — R58 Hemorrhage, not elsewhere classified: Secondary | ICD-10-CM | POA: Diagnosis not present

## 2015-12-01 DIAGNOSIS — I10 Essential (primary) hypertension: Secondary | ICD-10-CM | POA: Diagnosis present

## 2015-12-01 DIAGNOSIS — Z9221 Personal history of antineoplastic chemotherapy: Secondary | ICD-10-CM | POA: Diagnosis not present

## 2015-12-01 DIAGNOSIS — Z9049 Acquired absence of other specified parts of digestive tract: Secondary | ICD-10-CM | POA: Diagnosis not present

## 2015-12-01 DIAGNOSIS — K625 Hemorrhage of anus and rectum: Secondary | ICD-10-CM | POA: Diagnosis not present

## 2015-12-01 DIAGNOSIS — J069 Acute upper respiratory infection, unspecified: Secondary | ICD-10-CM | POA: Diagnosis not present

## 2015-12-01 DIAGNOSIS — R7989 Other specified abnormal findings of blood chemistry: Secondary | ICD-10-CM | POA: Diagnosis present

## 2015-12-01 HISTORY — DX: Unspecified osteoarthritis, unspecified site: M19.90

## 2015-12-01 HISTORY — DX: Malignant neoplasm of colon, unspecified: C18.9

## 2015-12-01 HISTORY — DX: Gastric ulcer, unspecified as acute or chronic, without hemorrhage or perforation: K25.9

## 2015-12-01 HISTORY — DX: Calculus of gallbladder without cholecystitis without obstruction: K80.20

## 2015-12-01 LAB — COMPREHENSIVE METABOLIC PANEL
ALT: 22 U/L (ref 14–54)
AST: 21 U/L (ref 15–41)
Albumin: 2.9 g/dL — ABNORMAL LOW (ref 3.5–5.0)
Alkaline Phosphatase: 46 U/L (ref 38–126)
Anion gap: 11 (ref 5–15)
BUN: 47 mg/dL — ABNORMAL HIGH (ref 6–20)
CO2: 27 mmol/L (ref 22–32)
Calcium: 8.6 mg/dL — ABNORMAL LOW (ref 8.9–10.3)
Chloride: 104 mmol/L (ref 101–111)
Creatinine, Ser: 1.19 mg/dL — ABNORMAL HIGH (ref 0.44–1.00)
GFR calc Af Amer: 45 mL/min — ABNORMAL LOW (ref >60)
GFR calc non Af Amer: 39 mL/min — ABNORMAL LOW (ref >60)
Glucose, Bld: 153 mg/dL — ABNORMAL HIGH (ref 65–99)
Potassium: 3.7 mmol/L (ref 3.5–5.1)
Sodium: 142 mmol/L (ref 135–145)
Total Bilirubin: 0.4 mg/dL (ref 0.3–1.2)
Total Protein: 6.5 g/dL (ref 6.5–8.1)

## 2015-12-01 LAB — IRON AND TIBC
Iron: 116 ug/dL (ref 28–170)
Saturation Ratios: 63 % — ABNORMAL HIGH (ref 10.4–31.8)
TIBC: 185 ug/dL — ABNORMAL LOW (ref 250–450)
UIBC: 69 ug/dL

## 2015-12-01 LAB — I-STAT CHEM 8, ED
BUN: 41 mg/dL — ABNORMAL HIGH (ref 6–20)
Calcium, Ion: 0.99 mmol/L — ABNORMAL LOW (ref 1.13–1.30)
Chloride: 101 mmol/L (ref 101–111)
Creatinine, Ser: 1.1 mg/dL — ABNORMAL HIGH (ref 0.44–1.00)
Glucose, Bld: 145 mg/dL — ABNORMAL HIGH (ref 65–99)
HCT: 34 % — ABNORMAL LOW (ref 36.0–46.0)
Hemoglobin: 11.6 g/dL — ABNORMAL LOW (ref 12.0–15.0)
Potassium: 3.6 mmol/L (ref 3.5–5.1)
Sodium: 142 mmol/L (ref 135–145)
TCO2: 26 mmol/L (ref 0–100)

## 2015-12-01 LAB — POC OCCULT BLOOD, ED: Fecal Occult Bld: POSITIVE — AB

## 2015-12-01 LAB — FOLATE: Folate: 21.5 ng/mL (ref >5.9)

## 2015-12-01 LAB — CBC
HCT: 35.8 % — ABNORMAL LOW (ref 36.0–46.0)
HCT: 36.3 % (ref 36.0–46.0)
Hemoglobin: 11.7 g/dL — ABNORMAL LOW (ref 12.0–15.0)
Hemoglobin: 12.1 g/dL (ref 12.0–15.0)
MCH: 30.5 pg (ref 26.0–34.0)
MCH: 30.3 pg (ref 26.0–34.0)
MCHC: 32.7 g/dL (ref 30.0–36.0)
MCHC: 33.3 g/dL (ref 30.0–36.0)
MCV: 93.2 fL (ref 78.0–100.0)
MCV: 90.8 fL (ref 78.0–100.0)
Platelets: 234 10*3/uL (ref 150–400)
Platelets: 228 10*3/uL (ref 150–400)
RBC: 3.84 MIL/uL — ABNORMAL LOW (ref 3.87–5.11)
RBC: 4 MIL/uL (ref 3.87–5.11)
RDW: 12.8 % (ref 11.5–15.5)
RDW: 12.7 % (ref 11.5–15.5)
WBC: 6.9 10*3/uL (ref 4.0–10.5)
WBC: 6.9 10*3/uL (ref 4.0–10.5)

## 2015-12-01 LAB — FERRITIN: Ferritin: 320 ng/mL — ABNORMAL HIGH (ref 11–307)

## 2015-12-01 LAB — I-STAT TROPONIN, ED: Troponin i, poc: 0.01 ng/mL (ref 0.00–0.08)

## 2015-12-01 LAB — LACTIC ACID, PLASMA
Lactic Acid, Venous: 2 mmol/L (ref 0.5–2.0)
Lactic Acid, Venous: 1.6 mmol/L (ref 0.5–2.0)

## 2015-12-01 LAB — DIFFERENTIAL
Basophils Absolute: 0 10*3/uL (ref 0.0–0.1)
Basophils Relative: 0 %
Eosinophils Absolute: 0.1 10*3/uL (ref 0.0–0.7)
Eosinophils Relative: 1 %
Lymphocytes Relative: 25 %
Lymphs Abs: 1.7 10*3/uL (ref 0.7–4.0)
Monocytes Absolute: 0.6 10*3/uL (ref 0.1–1.0)
Monocytes Relative: 8 %
Neutro Abs: 4.5 10*3/uL (ref 1.7–7.7)
Neutrophils Relative %: 66 %

## 2015-12-01 LAB — ABO/RH: ABO/RH(D): B POS

## 2015-12-01 LAB — MRSA PCR SCREENING: MRSA by PCR: NEGATIVE

## 2015-12-01 LAB — RETICULOCYTES
RBC.: 3.86 MIL/uL — ABNORMAL LOW (ref 3.87–5.11)
Retic Count, Absolute: 23.2 10*3/uL (ref 19.0–186.0)
Retic Ct Pct: 0.6 % (ref 0.4–3.1)

## 2015-12-01 LAB — PROTIME-INR
INR: 1.18 (ref 0.00–1.49)
Prothrombin Time: 15.1 seconds (ref 11.6–15.2)

## 2015-12-01 LAB — PREPARE RBC (CROSSMATCH)

## 2015-12-01 LAB — APTT: aPTT: 30 seconds (ref 24–37)

## 2015-12-01 LAB — VITAMIN B12: Vitamin B-12: 272 pg/mL (ref 180–914)

## 2015-12-01 LAB — I-STAT CG4 LACTIC ACID, ED: Lactic Acid, Venous: 3.71 mmol/L (ref 0.5–2.0)

## 2015-12-01 MED ORDER — DEXTROMETHORPHAN POLISTIREX ER 30 MG/5ML PO SUER
60.0000 mg | Freq: Four times a day (QID) | ORAL | Status: DC | PRN
  Administered 2015-12-02 – 2015-12-04 (×3): 60 mg via ORAL
  Filled 2015-12-01 (×5): qty 10

## 2015-12-01 MED ORDER — FERROUS SULFATE 325 (65 FE) MG PO TABS
325.0000 mg | ORAL_TABLET | ORAL | Status: DC
  Administered 2015-12-01 – 2015-12-03 (×2): 325 mg via ORAL
  Filled 2015-12-01 (×2): qty 1

## 2015-12-01 MED ORDER — ONDANSETRON HCL 4 MG PO TABS: 4.0000 mg | ORAL_TABLET | Freq: Four times a day (QID) | ORAL | Status: DC | PRN

## 2015-12-01 MED ORDER — SODIUM CHLORIDE 0.9 % IV SOLN: 10.0000 mL/h | Freq: Once | INTRAVENOUS | Status: DC

## 2015-12-01 MED ORDER — TRAMADOL HCL 50 MG PO TABS: 50.0000 mg | ORAL_TABLET | Freq: Four times a day (QID) | ORAL | Status: DC | PRN

## 2015-12-01 MED ORDER — ONDANSETRON HCL 4 MG/2ML IJ SOLN: 4.0000 mg | Freq: Four times a day (QID) | INTRAMUSCULAR | Status: DC | PRN

## 2015-12-01 MED ORDER — NORTRIPTYLINE HCL 10 MG PO CAPS
10.0000 mg | ORAL_CAPSULE | Freq: Two times a day (BID) | ORAL | Status: DC
  Administered 2015-12-01 – 2015-12-04 (×6): 10 mg via ORAL
  Filled 2015-12-01 (×7): qty 1

## 2015-12-01 MED ORDER — CETYLPYRIDINIUM CHLORIDE 0.05 % MT LIQD
7.0000 mL | Freq: Two times a day (BID) | OROMUCOSAL | Status: DC
  Administered 2015-12-01 – 2015-12-04 (×6): 7 mL via OROMUCOSAL

## 2015-12-01 MED ORDER — HYDROXYZINE HCL 25 MG PO TABS: 25.0000 mg | ORAL_TABLET | Freq: Every evening | ORAL | Status: DC | PRN

## 2015-12-01 MED ORDER — ACETAMINOPHEN 500 MG PO TABS: 1000.0000 mg | ORAL_TABLET | Freq: Two times a day (BID) | ORAL | Status: DC | PRN

## 2015-12-01 MED ORDER — PRAVASTATIN SODIUM 40 MG PO TABS
40.0000 mg | ORAL_TABLET | Freq: Every day | ORAL | Status: DC
  Administered 2015-12-01 – 2015-12-04 (×4): 40 mg via ORAL
  Filled 2015-12-01: qty 2
  Filled 2015-12-01: qty 1
  Filled 2015-12-01 (×2): qty 2

## 2015-12-01 MED ORDER — OXYCODONE-ACETAMINOPHEN 5-325 MG PO TABS
2.0000 | ORAL_TABLET | ORAL | Status: DC | PRN
  Administered 2015-12-01: 2 via ORAL
  Filled 2015-12-01: qty 2

## 2015-12-01 MED ORDER — PANTOPRAZOLE SODIUM 40 MG IV SOLR
40.0000 mg | Freq: Two times a day (BID) | INTRAVENOUS | Status: DC
  Administered 2015-12-01 – 2015-12-04 (×7): 40 mg via INTRAVENOUS
  Filled 2015-12-01 (×8): qty 40

## 2015-12-01 MED ORDER — SODIUM CHLORIDE 0.9 % IV SOLN
INTRAVENOUS | Status: DC
  Administered 2015-12-01: 14:00:00 via INTRAVENOUS

## 2015-12-01 MED ORDER — CYCLOBENZAPRINE HCL 10 MG PO TABS: 10.0000 mg | ORAL_TABLET | Freq: Two times a day (BID) | ORAL | Status: DC | PRN

## 2015-12-01 NOTE — ED Notes (Signed)
Bed: RESB Expected date:  Expected time:  Means of arrival:  Comments: EMS-cold symptoms, GI bleed

## 2015-12-01 NOTE — Consult Note (Signed)
[facility] Consult: 2:18 PM 12/01/2015  LOS: 0 days    Referring Provider: Dr Doyle Askew  Primary Care Physician:  Merrilee Seashore, MD Primary Gastroenterologist:  Dr. Lajoyce Corners, Dr Ardis Hughs (2011)    Reason for Consultation:  Hematochezia.    HPI: Julia Robbins is a 82 y.o. female.  Assisted living resident. Hx colon cancer (T3N1, stage 3, 2/15 positive nodes) at hepatic flexure, s/p colectomy and chemotherapy 1998.  DVT 1998, s/p IVC filter.  Cholelithiasis with 15 mm CBD on CT in 10/2008. Degenerative arthritis.  Gastric antral ulcer in 1998; H Pylori positive.  Diverticulosis.    12/2003 Colonoscopy:  Dr Lajoyce Corners.  Unremarkable examination to the neocecum.  + hemorrhoids.  08/2001 Colonoscopy: no recurrence. Mild diverticulosis.  Met with Dr Ardis Hughs in 2011 at age 61 and she was not interested in repeat colonoscopy.  He did suggest offer FOBT testing at the office visit. .   Starting 1 week ago, has been having nasal congestion and cough.  Some discomfort in LLQ into RLQ, not severe and not associated with changes in bowels.  ~ 8 AM his morning blew her nose and saw some blood in the snot; this was not epistaxis with blood dripping or passing into back of throat.  Then had one episode of passing large volume of medium to deep red, bloody stool.  + weakness, dizziness. + nausea, vomiting but did not see bloody or CG material.     BP 94/67, pulse to 104.  sats 93 to 96%.  Hgb is 11.6, MCV 93.  Platelets and coags normal.  BUN/creat up at 47/1.1, normal in 04/2015 Pt takes oral iron but no PPI.  No reflux sxs, no dysphagia.  Appetite generally very good and stable weight.  However decreased appetite and po since having resp sxs last week.     Past Medical History  Diagnosis Date  . Colon cancer ([place]) 1998    chemo and  colectomy.  T3 N1 stage 3, 2/15 nodes +.   . Greenfield filter in place 1998    for DVT.   Marland Kitchen Gastric ulcer 1998  . Cholelithiasis 2010    15 mm CBD on CT scan 2010.   Marland Kitchen Arthritis     degenerative.     Past Surgical History  Procedure Laterality Date  . Colon surgery  1998    Prior to Admission medications   Medication Sig Start Date End Date Taking? Authorizing Provider  acetaminophen (TYLENOL) 500 MG tablet Take 1,000 mg by mouth 2 (two) times daily as needed for moderate pain or headache.   Yes Historical Provider, MD  Ascorbic Acid (VITAMIN C) 1000 MG tablet Take 1,000 mg by mouth daily.   Yes Historical Provider, MD  Cholecalciferol (VITAMIN D-3 PO) Take 1 tablet by mouth daily.   Yes Historical Provider, MD  cyclobenzaprine (FLEXERIL) 10 MG tablet Take 10 mg by mouth 2 (two) times daily as needed for muscle spasms.    Yes Historical Provider, MD  dextromethorphan (DELSYM) 30 MG/5ML liquid Take 60 mg by mouth as needed for  cough.   Yes Historical Provider, MD  DM-Phenylephrine-Acetaminophen (ALKA-SELTZER PLUS DAY COLD/FLU PO) Take 2 tablets by mouth daily as needed (cold symptoms).   Yes Historical Provider, MD  ferrous sulfate 325 (65 FE) MG tablet Take 325 mg by mouth 3 (three) times a week.   Yes Historical Provider, MD  furosemide (LASIX) 40 MG tablet Take 80 mg by mouth daily.    Yes Historical Provider, MD  hydrOXYzine (ATARAX/VISTARIL) 25 MG tablet Take 25 mg by mouth at bedtime as needed for itching.    Yes Historical Provider, MD  Multiple Vitamin (MULTIVITAMIN) capsule Take 1 capsule by mouth daily.   Yes Historical Provider, MD  nortriptyline (PAMELOR) 10 MG capsule Take 10 mg by mouth 2 (two) times daily.   Yes Historical Provider, MD  oxyCODONE-acetaminophen (PERCOCET) 5-325 MG per tablet Take 2 tablets by mouth every 4 (four) hours as needed for pain. 11/30/12  Yes Wandra Arthurs, MD  potassium chloride SA (K-DUR,KLOR-CON) 20 MEQ tablet Take 20 mEq by mouth 2 (two) times  daily.    Yes Historical Provider, MD  pravastatin (PRAVACHOL) 40 MG tablet Take 40 mg by mouth daily.   Yes Historical Provider, MD  traMADol (ULTRAM) 50 MG tablet Take 50 mg by mouth every 6 (six) hours as needed for moderate pain.   Yes Historical Provider, MD    Scheduled Meds: . pantoprazole (PROTONIX) IV  40 mg Intravenous Q12H   Infusions: . sodium chloride 75 mL/hr at 12/01/15 1335   PRN Meds: ondansetron **OR** ondansetron (ZOFRAN) IV, oxyCODONE-acetaminophen   Allergies as of 12/01/2015  . (No Known Allergies)    History reviewed. No pertinent family history.  Social History   Social History  . Marital Status: Widowed    Spouse Name: N/A  . Number of Children: N/A  . Years of Education: N/A   Occupational History  . Not on file.   Social History Main Topics  . Smoking status: Never Smoker   . Smokeless tobacco: Not on file  . Alcohol Use: No  . Drug Use: No  . Sexual Activity: No   Other Topics Concern  . Not on file   Social History Narrative    REVIEW OF SYSTEMS: Constitutional:  Generally pretty strong, and walks about on her own ENT:  No nose bleeds Pulm:  Recent URI as per HPI CV:  No palpitations, no LE edema. No chest pain GU:  No hematuria, no frequency GI:  Per HPI Heme:  Usually no unusual bleeding or bruising.  Takes daily po iron.    Transfusions:  None per her recall Neuro:  No headaches, no peripheral tingling or numbness Derm:  No itching, no rash or sores.  Endocrine:  No sweats or chills.  No polyuria or dysuria Immunization:  Current on flu shot.  Travel:  None beyond local counties in last few months.    PHYSICAL EXAM: Vital signs in last 24 hours: Filed Vitals:   12/01/15 0953 12/01/15 1229  BP: 94/67 97/69  Pulse: 84 103  Temp: 98.3 F (36.8 C)   Resp: 20 26   Wt Readings from Last 3 Encounters:  04/15/15 79.47 kg (175 lb 3.2 oz)  04/17/14 78.382 kg (172 lb 12.8 oz)  04/18/13 80.831 kg (178 lb 3.2 oz)     General: pleasant, frail/elderly AAF.  She is comfortable and alert Head:  No asymmetry or edema.  No signs of head trauma  Eyes:  No pallor Ears:  Slightly HOH  Nose:  No congestion or discharge Mouth:  Moist, clear oral Mucosa.  Edentulous.  Neck:  No jvd, mass, TMG Lungs:  Clear bil but slightly diminished breath sounds.  No cough or dyspnea Heart: RRR.  No mrg.  S1, S2 audible Abdomen:  Soft, ND.  No mass or HSM.  No bruits.  Slight tenderness in bil LQ.  Active BS.   Rectal: deep red blood.  No mass.    Musc/Skeltl: scoliosis.   Extremities:  No CCE  Neurologic:  Oriented x 3.  Moves all 4 limbs, strength not tested Skin:  No telangectasia, rash or sores Tattoos:  none Nodes:  No cervical or inguinal adenopathy.    Psych:  Pleasant, cooperative, calm.   Intake/Output from previous day:   Intake/Output this shift:    LAB RESULTS:  Recent Labs  12/01/15 0958 12/01/15 1020  WBC 6.9  --   HGB 11.7* 11.6*  HCT 35.8* 34.0*  PLT 234  --    BMET Lab Results  Component Value Date   NA 142 12/01/2015   NA 142 12/01/2015   NA 141 04/15/2015   K 3.6 12/01/2015   K 3.7 12/01/2015   K 4.2 04/15/2015   CL 101 12/01/2015   CL 104 12/01/2015   CL 103 11/30/2012   CO2 27 12/01/2015   CO2 25 04/15/2015   CO2 28 04/17/2014   GLUCOSE 145* 12/01/2015   GLUCOSE 153* 12/01/2015   GLUCOSE 103 04/15/2015   BUN 41* 12/01/2015   BUN 47* 12/01/2015   BUN 13.0 04/15/2015   CREATININE 1.10* 12/01/2015   CREATININE 1.19* 12/01/2015   CREATININE 0.9 04/15/2015   CALCIUM 8.6* 12/01/2015   CALCIUM 9.8 04/15/2015   CALCIUM 9.8 04/17/2014   LFT  Recent Labs  12/01/15 0958  PROT 6.5  ALBUMIN 2.9*  AST 21  ALT 22  ALKPHOS 46  BILITOT 0.4   PT/INR Lab Results  Component Value Date   INR 1.18 12/01/2015   INR 1.1 10/04/2008   Hepatitis Panel No results for input(s): HEPBSAG, HCVAB, HEPAIGM, HEPBIGM in the last 72 hours. C-Diff No components found for:  CDIFF Lipase     Component Value Date/Time   LIPASE 24 10/04/2008 1400    Drugs of Abuse  No results found for: LABOPIA, COCAINSCRNUR, LABBENZ, AMPHETMU, THCU, LABBARB   RADIOLOGY STUDIES: Dg Chest 2 View  12/01/2015  CLINICAL DATA:  Cough EXAM: CHEST  2 VIEW COMPARISON:  10/04/2008 FINDINGS: There is cardiomegaly. Low lung volumes without confluent airspace opacity or effusion. No acute bony abnormality. IMPRESSION: Cardiomegaly.  Low lung volumes.  No active disease. Electronically Signed   By: Rolm Baptise M.D.   On: 12/01/2015 11:05    ENDOSCOPIC STUDIES: Per HPI  IMPRESSION:   *  Acute hematochezia.  Suspect diverticular bleed.  *  S/p partial colectomy and chemotherapy 1998 for colon cancer of hepatic flexure. No recurrences as of latest colonoscopy in 2005.    *  URI for the last week: cough and nasal congestion.  Did have flu shot recently.  CXR unrevealing.   *  AKI.      PLAN:     *  Per Dr Henrene Pastor.  Clears, serial CBC    Azucena Freed  12/01/2015, 2:18 PM Pager: [phone]  GI ATTENDING  History, laboratories, x-rays, prior endoscopy reports reviewed. Patient seen and examined. Agree with comprehensive consultation note as outlined in this complex elderly patient. Patient presenting with hemodynamically stable painless hematochezia. Suspected diverticular bleed, clinically. No diverticula  noted on last colonoscopy though this was 12 years ago. Agree with supportive care and monitoring. Should bleeding become significant or persist, then endoscopic evaluation may be indicated. She is high-risk.  Docia Chuck. Geri Seminole., M.D. [facility] Division of Gastroenterology

## 2015-12-01 NOTE — ED Notes (Signed)
Per EMS-states she got up and felt blood oozing from rectum-estimated blood loss of 500cc-symptomatic-weak-also having cold symptoms, congestion

## 2015-12-01 NOTE — H&P (Signed)
[facility] History and Physical  Julia Robbins [id number] DOB: May 10, 1923 DOA: 12/01/2015  Referring physician: ED physician, Dr. Oleta Mouse  PCP: Merrilee Seashore, MD   Chief Complaint: rectal bleed   HPI:  Patient is a 82 year old female with prior history of colon cancer, presents to [facility] emergency department for evaluation of acute onset of bright red blood per rectum. Per EMS report, when patient stood up blood was oozing and they approximated 500 mL of blood. Please note that patient asked daughter to provide history as she feels tired. Daughter explains she noticed that mom was weaker this morning and had difficulty standing up, she was worried about this bleeding is she has not recently seen anything similar. No reports of chest pain or shortness of breath, no fevers or chills, no specific changes in appetite, no abdominal or urinary concerns reported.  In emergency department, vital signs notable for mild tachycardia HR up to 103, blood pressure 97/69. Initial hemoglobin 11.7, creatinine 1.19. Emergency room doctor consulted gastroenterology team and [place] asked to admit to step down unit due to soft blood pressure.  Assessment and Plan:  Principal Problem:   GI bleed, acute blood loss anemia - admit to SDU - hold off on transfusion for now - repeat CBC this afternoon - follow up on GI recommendations  - keep NPO for now until seen by GI - Anemia panel requested  Active Problems:   Acute kidney injury ([place]) - from GI bleed - hold lasix, place on IVF - repeat in AM    Elevated lactic acid level - likely reactive - IVF ordered, monitor response  - holding lasix that pt takes at home  SCD's for DVT prophylaxis   Radiological Exams on Admission: Dg Chest 2 View 12/01/2015  Cardiomegaly.  Low lung volumes.  No active disease.   Code Status: Full Family Communication: Daughter at bedside  Disposition Plan: Admit for further evaluation    [name] [id number]   Review of Systems:  Constitutional: Negative for fever, chills. Negative for diaphoresis.  HENT: Negative for hearing loss, tinnitus and ear discharge.   Eyes: Negative for blurred vision, double vision, photophobia, pain, discharge and redness.  Respiratory: Negative for cough, hemoptysis, sputum production, wheezing and stridor.   Cardiovascular: Negative for chest pain, palpitations, orthopnea, claudication and leg swelling.  Gastrointestinal: Negative for nausea, vomiting and abdominal pain.  Genitourinary: Negative for dysuria, urgency, frequency, hematuria and flank pain.  Musculoskeletal: Negative for myalgias, back pain, joint pain and falls.  Skin: Negative for itching and rash.  Neurological: Negative for tingling, tremors, sensory change, speech change, focal weakness, loss of consciousness and headaches.  Endo/Heme/Allergies: Negative for environmental allergies and polydipsia. Does not bruise/bleed easily.  Psychiatric/Behavioral: Negative for suicidal ideas. The patient is not nervous/anxious.      Past Medical History  Diagnosis Date  . Cancer ([place])     colon  . Greenfield filter in place     Past Surgical History  Procedure Laterality Date  . Colon surgery      Social History:  reports that she has never smoked. She does not have any smokeless tobacco history on file. She reports that she does not drink alcohol or use illicit drugs.  No Known Allergies  No family history of hypertension  Prior to Admission medications   Medication Sig Start Date End Date Taking? Authorizing Provider  acetaminophen (TYLENOL) 500 MG tablet Take 1,000 mg by mouth 2 (two) times daily as needed for moderate pain  or headache.   Yes Historical Provider, MD  Ascorbic Acid (VITAMIN C) 1000 MG tablet Take 1,000 mg by mouth daily.   Yes Historical Provider, MD  Cholecalciferol (VITAMIN D-3 PO) Take 1 tablet by mouth daily.   Yes Historical Provider, MD   cyclobenzaprine (FLEXERIL) 10 MG tablet Take 10 mg by mouth 2 (two) times daily as needed for muscle spasms.    Yes Historical Provider, MD  dextromethorphan (DELSYM) 30 MG/5ML liquid Take 60 mg by mouth as needed for cough.   Yes Historical Provider, MD  DM-Phenylephrine-Acetaminophen (ALKA-SELTZER PLUS DAY COLD/FLU PO) Take 2 tablets by mouth daily as needed (cold symptoms).   Yes Historical Provider, MD  ferrous sulfate 325 (65 FE) MG tablet Take 325 mg by mouth 3 (three) times a week.   Yes Historical Provider, MD  furosemide (LASIX) 40 MG tablet Take 80 mg by mouth daily.    Yes Historical Provider, MD  hydrOXYzine (ATARAX/VISTARIL) 25 MG tablet Take 25 mg by mouth at bedtime as needed for itching.    Yes Historical Provider, MD  Multiple Vitamin (MULTIVITAMIN) capsule Take 1 capsule by mouth daily.   Yes Historical Provider, MD  nortriptyline (PAMELOR) 10 MG capsule Take 10 mg by mouth 2 (two) times daily.   Yes Historical Provider, MD  oxyCODONE-acetaminophen (PERCOCET) 5-325 MG per tablet Take 2 tablets by mouth every 4 (four) hours as needed for pain. 11/30/12  Yes Wandra Arthurs, MD  potassium chloride SA (K-DUR,KLOR-CON) 20 MEQ tablet Take 20 mEq by mouth 2 (two) times daily.    Yes Historical Provider, MD  pravastatin (PRAVACHOL) 40 MG tablet Take 40 mg by mouth daily.   Yes Historical Provider, MD  traMADol (ULTRAM) 50 MG tablet Take 50 mg by mouth every 6 (six) hours as needed for moderate pain.   Yes Historical Provider, MD    Physical Exam: Filed Vitals:   12/01/15 0953 12/01/15 1229  BP: 94/67 97/69  Pulse: 84 103  Temp: 98.3 F (36.8 C)   TempSrc: Oral   Resp: 20 26  SpO2: 96% 93%    Physical Exam  Constitutional: Appears well-developed and well-nourished. No distress.  HENT: Normocephalic. External right and left ear normal. Dry mucous membranes Eyes: Conjunctivae and EOM are normal. PERRLA, no scleral icterus.  Neck: Normal ROM. Neck supple. No JVD. No tracheal  deviation. No thyromegaly.  CVS: Regular rhythm, tachycardic, S1/S2 +, no gallops, no carotid bruit.  Pulmonary: Effort and breath sounds normal, no stridor, rhonchi, wheezes, rales.  Abdominal: Soft. BS +,  no distension, tenderness, rebound or guarding.  Musculoskeletal: Normal range of motion. No edema and no tenderness.  Lymphadenopathy: No lymphadenopathy noted, cervical, inguinal. Neuro: Alert. Normal reflexes, muscle tone coordination. No cranial nerve deficit. Skin: Skin is warm and dry. No rash noted. Not diaphoretic. No erythema. No pallor.  Psychiatric: Normal mood and affect.   Labs on Admission:  Basic Metabolic Panel:  Recent Labs Lab 12/01/15 0958 12/01/15 1020  NA 142 142  K 3.7 3.6  CL 104 101  CO2 27  --   GLUCOSE 153* 145*  BUN 47* 41*  CREATININE 1.19* 1.10*  CALCIUM 8.6*  --    Liver Function Tests:  Recent Labs Lab 12/01/15 0958  AST 21  ALT 22  ALKPHOS 46  BILITOT 0.4  PROT 6.5  ALBUMIN 2.9*   CBC:  Recent Labs Lab 12/01/15 0958 12/01/15 1020  WBC 6.9  --   NEUTROABS 4.5  --   HGB 11.7*  11.6*  HCT 35.8* 34.0*  MCV 93.2  --   PLT 234  --    EKG: Pending  If 7PM-7AM, please contact night-coverage [email or web address] Password [email or web address] 12/01/2015, 12:50 PM

## 2015-12-01 NOTE — Progress Notes (Signed)
Pt is asleep, easy to arouse. Breathing WNL, no distress assessed. VS are stable. Pt denies pain, states she feels better. Encouraged to call for assistance. Agree with assessment from nurse prior.

## 2015-12-01 NOTE — ED Notes (Signed)
Bed: WA13 Expected date:  Expected time:  Means of arrival:  Comments: RES B 

## 2015-12-01 NOTE — ED Provider Notes (Signed)
CSN: [id number]     Arrival date & time 12/01/15  0945 History   First MD Initiated Contact with Patient 12/01/15 [phone]     Chief Complaint  Patient presents with  . Rectal Bleeding     (Consider location/radiation/quality/duration/timing/severity/associated sxs/prior Treatment) HPI 82 year old female who presents with lower GI bleeding. History of stage II colon cancer s/p resection and treatment in 1998. Has been in remission, with last colonoscopy normal in 2005. States this week with URI symptoms of cough, congestion, runny nose. Normal BM until this morning, passing blood with blood clot. Felt lightheaded but no chest pain, syncope, shortness of breath, or abdominal pain. No fever, chills, or urinary complaints. No blood thinners. No prior GI bleed history.  EMS arrived and reported about 500 cc of blood noted.  Past Medical History  Diagnosis Date  . Colon cancer ([place]) 1998    chemo and colectomy.  T3 N1 stage 3, 2/15 nodes +.   . [place] filter in place 1998    for DVT.   Marland Kitchen Gastric ulcer 1998  . Cholelithiasis 2010    15 mm CBD on CT scan 2010.   Marland Kitchen Arthritis     degenerative.    Past Surgical History  Procedure Laterality Date  . Colon surgery  1998   History reviewed. No pertinent family history. Social History  Substance Use Topics  . Smoking status: Never Smoker   . Smokeless tobacco: None  . Alcohol Use: No   OB History    No data available     Review of Systems 10/14 systems reviewed and are negative other than those stated in the HPI    Allergies  Review of patient's allergies indicates no known allergies.  Home Medications   Prior to Admission medications   Medication Sig Start Date End Date Taking? Authorizing Provider  acetaminophen (TYLENOL) 500 MG tablet Take 1,000 mg by mouth 2 (two) times daily as needed for moderate pain or headache.   Yes Historical Provider, MD  Ascorbic Acid (VITAMIN C) 1000 MG tablet Take 1,000 mg by mouth daily.   Yes  Historical Provider, MD  Cholecalciferol (VITAMIN D-3 PO) Take 1 tablet by mouth daily.   Yes Historical Provider, MD  cyclobenzaprine (FLEXERIL) 10 MG tablet Take 10 mg by mouth 2 (two) times daily as needed for muscle spasms.    Yes Historical Provider, MD  dextromethorphan (DELSYM) 30 MG/5ML liquid Take 60 mg by mouth as needed for cough.   Yes Historical Provider, MD  DM-Phenylephrine-Acetaminophen (ALKA-SELTZER PLUS DAY COLD/FLU PO) Take 2 tablets by mouth daily as needed (cold symptoms).   Yes Historical Provider, MD  ferrous sulfate 325 (65 FE) MG tablet Take 325 mg by mouth 3 (three) times a week.   Yes Historical Provider, MD  furosemide (LASIX) 40 MG tablet Take 80 mg by mouth daily.    Yes Historical Provider, MD  hydrOXYzine (ATARAX/VISTARIL) 25 MG tablet Take 25 mg by mouth at bedtime as needed for itching.    Yes Historical Provider, MD  Multiple Vitamin (MULTIVITAMIN) capsule Take 1 capsule by mouth daily.   Yes Historical Provider, MD  nortriptyline (PAMELOR) 10 MG capsule Take 10 mg by mouth 2 (two) times daily.   Yes Historical Provider, MD  oxyCODONE-acetaminophen (PERCOCET) 5-325 MG per tablet Take 2 tablets by mouth every 4 (four) hours as needed for pain. 11/30/12  Yes Wandra Arthurs, MD  potassium chloride SA (K-DUR,KLOR-CON) 20 MEQ tablet Take 20 mEq by mouth 2 (  two) times daily.    Yes Historical Provider, MD  pravastatin (PRAVACHOL) 40 MG tablet Take 40 mg by mouth daily.   Yes Historical Provider, MD  traMADol (ULTRAM) 50 MG tablet Take 50 mg by mouth every 6 (six) hours as needed for moderate pain.   Yes Historical Provider, MD   BP 84/48 mmHg  Pulse 115  Temp(Src) 98.3 F (36.8 C) (Oral)  Resp 19  Ht 5\' 5"  (1.651 m)  SpO2 100% Physical Exam Physical Exam  Nursing note and vitals reviewed. Constitutional: Well developed, well nourished, non-toxic, and in no acute distress Head: Normocephalic and atraumatic.  Mouth/Throat: Oropharynx is clear and moist.  Neck: Normal  range of motion. Neck supple.  Cardiovascular: Tachycardic rate and regular rhythm.   Pulmonary/Chest: Effort normal and breath sounds normal.  Abdominal: Soft. There is no tenderness. There is no rebound and no guarding. Rectal exam revealing hematochezia. Musculoskeletal: Normal range of motion.  Neurological: Alert, no facial droop, fluent speech, moves all extremities symmetrically Skin: Skin is warm and dry.  Psychiatric: Cooperative  ED Course  Procedures (including critical care time) Labs Review Labs Reviewed  COMPREHENSIVE METABOLIC PANEL - Abnormal; Notable for the following:    Glucose, Bld 153 (*)    BUN 47 (*)    Creatinine, Ser 1.19 (*)    Calcium 8.6 (*)    Albumin 2.9 (*)    GFR calc non Af Amer 39 (*)    GFR calc Af Amer 45 (*)    All other components within normal limits  CBC - Abnormal; Notable for the following:    RBC 3.84 (*)    Hemoglobin 11.7 (*)    HCT 35.8 (*)    All other components within normal limits  IRON AND TIBC - Abnormal; Notable for the following:    TIBC 185 (*)    Saturation Ratios 63 (*)    All other components within normal limits  FERRITIN - Abnormal; Notable for the following:    Ferritin 320 (*)    All other components within normal limits  RETICULOCYTES - Abnormal; Notable for the following:    RBC. 3.86 (*)    All other components within normal limits  POC OCCULT BLOOD, ED - Abnormal; Notable for the following:    Fecal Occult Bld POSITIVE (*)    All other components within normal limits  I-STAT CHEM 8, ED - Abnormal; Notable for the following:    BUN 41 (*)    Creatinine, Ser 1.10 (*)    Glucose, Bld 145 (*)    Calcium, Ion 0.99 (*)    Hemoglobin 11.6 (*)    HCT 34.0 (*)    All other components within normal limits  I-STAT CG4 LACTIC ACID, ED - Abnormal; Notable for the following:    Lactic Acid, Venous 3.71 (*)    All other components within normal limits  MRSA PCR SCREENING  PROTIME-INR  APTT  DIFFERENTIAL  CBC   LACTIC ACID, PLASMA  LACTIC ACID, PLASMA  VITAMIN B12  FOLATE  URINALYSIS, ROUTINE W REFLEX MICROSCOPIC (NOT AT [facility])  CBC  BASIC METABOLIC PANEL  I-STAT TROPOININ, ED  TYPE AND SCREEN  ABO/RH  PREPARE RBC (CROSSMATCH)    Imaging Review Dg Chest 2 View  12/01/2015  CLINICAL DATA:  Cough EXAM: CHEST  2 VIEW COMPARISON:  10/04/2008 FINDINGS: There is cardiomegaly. Low lung volumes without confluent airspace opacity or effusion. No acute bony abnormality. IMPRESSION: Cardiomegaly.  Low lung volumes.  No active disease.  Electronically Signed   By: Rolm Baptise M.D.   On: 12/01/2015 11:05   I have personally reviewed and evaluated these images and lab results as part of my medical decision-making.   EKG Interpretation   Date/Time:  Wednesday December 01 2015 09:55:47 EST Ventricular Rate:  98 PR Interval:  161 QRS Duration: 134 QT Interval:  408 QTC Calculation: 521 R Axis:   -34 Text Interpretation:  Sinus tachycardia Atrial premature complex Left  bundle branch block Baseline wander in lead(s) III No prior EKG  Confirmed  by Leonila Speranza MD, Monterius Rolf [id number]) on 12/01/2015 10:26:55 AM      MDM   Final diagnoses:  Lower GI bleed    82 year old female with history of colon cancer who presents with lower GI bleed. Mildly tachycardic on arrival and soft BPs 90s-100s. Is in no acute distress. With soft abdomen and hematochezia on rectal exam. Hgb 11 with baseline around 13-14. Elevated Bun/Cr ratio and with AKI but not suspecting upper GI bleed. Discussed with gastroenterology, who will see in hospital. Admitted to SDU after discussion with Dr. Doyle Askew.     Forde Dandy, MD 12/01/15 [phone]

## 2015-12-01 NOTE — ED Notes (Signed)
Pt had 1 episode of dark red blood PR

## 2015-12-02 DIAGNOSIS — D62 Acute posthemorrhagic anemia: Secondary | ICD-10-CM

## 2015-12-02 DIAGNOSIS — N183 Chronic kidney disease, stage 3 unspecified: Secondary | ICD-10-CM

## 2015-12-02 DIAGNOSIS — I10 Essential (primary) hypertension: Secondary | ICD-10-CM

## 2015-12-02 DIAGNOSIS — K922 Gastrointestinal hemorrhage, unspecified: Secondary | ICD-10-CM | POA: Insufficient documentation

## 2015-12-02 DIAGNOSIS — E872 Acidosis: Secondary | ICD-10-CM

## 2015-12-02 LAB — CBC
HCT: 32.1 % — ABNORMAL LOW (ref 36.0–46.0)
Hemoglobin: 10.4 g/dL — ABNORMAL LOW (ref 12.0–15.0)
MCH: 30.4 pg (ref 26.0–34.0)
MCHC: 32.4 g/dL (ref 30.0–36.0)
MCV: 93.9 fL (ref 78.0–100.0)
Platelets: 221 10*3/uL (ref 150–400)
RBC: 3.42 MIL/uL — ABNORMAL LOW (ref 3.87–5.11)
RDW: 13.1 % (ref 11.5–15.5)
WBC: 5.6 10*3/uL (ref 4.0–10.5)

## 2015-12-02 LAB — BASIC METABOLIC PANEL
Anion gap: 8 (ref 5–15)
BUN: 32 mg/dL — ABNORMAL HIGH (ref 6–20)
CO2: 26 mmol/L (ref 22–32)
Calcium: 8.1 mg/dL — ABNORMAL LOW (ref 8.9–10.3)
Chloride: 107 mmol/L (ref 101–111)
Creatinine, Ser: 0.97 mg/dL (ref 0.44–1.00)
GFR calc Af Amer: 58 mL/min — ABNORMAL LOW (ref >60)
GFR calc non Af Amer: 50 mL/min — ABNORMAL LOW (ref >60)
Glucose, Bld: 97 mg/dL (ref 65–99)
Potassium: 3.6 mmol/L (ref 3.5–5.1)
Sodium: 141 mmol/L (ref 135–145)

## 2015-12-02 MED ORDER — CYANOCOBALAMIN 500 MCG PO TABS
500.0000 ug | ORAL_TABLET | Freq: Every day | ORAL | Status: DC
  Administered 2015-12-02 – 2015-12-04 (×3): 500 ug via ORAL
  Filled 2015-12-02 (×4): qty 1

## 2015-12-02 NOTE — Care Management Note (Signed)
Case Management Note  Patient Details  Name: Julia Robbins MRN: [id number] Date of Birth: 29-Mar-1923  Subjective/Objective:       Hypotension and gi bld             Action/Plan: Date:  December 02, 2015 Chart reviewed for concurrent status and case management needs. Will continue to follow patient for changes and needs: Velva Harman, BSN, RN, [place]   [phone]  Expected Discharge Date:   (unknown)               Expected Discharge Plan:  Home/Self Care  In-House Referral:  NA  Discharge planning Services  CM Consult  Post Acute Care Choice:  NA Choice offered to:  NA  DME Arranged:    DME Agency:     HH Arranged:    HH Agency:     Status of Service:  In process, will continue to follow  Medicare Important Message Given:    Date Medicare IM Given:    Medicare IM give by:    Date Additional Medicare IM Given:    Additional Medicare Important Message give by:     If discussed at [place] of Stay Meetings, dates discussed:    Additional Comments:  Leeroy Cha, RN 12/02/2015, 10:29 AM

## 2015-12-02 NOTE — Progress Notes (Signed)
     [facility] Progress Note  Subjective:  Feeling ok.  Per nursing, she had a BM this morning that was mostly brown with maybe minimal amount of blood.  Had a couple of stools with some blood yesterday.  Objective:  Vital signs in last 24 hours: Temp:  [98.2 F (36.8 C)-99.1 F (37.3 C)] 98.2 F (36.8 C) (03/02 0800) Pulse Rate:  [84-117] 90 (03/01 2300) Resp:  [15-27] 21 (03/01 2300) BP: (84-132)/(48-78) 125/64 mmHg (03/01 2000) SpO2:  [93 %-100 %] 98 % (03/01 2300) Last BM Date: 12/01/15 General:  Alert, frail/elderly, in NAD Heart:  Regular rate and rhythm; no murmurs Pulm:  CTAB.  No W/R/R. Abdomen:  Soft, non-distended.  BS present.  Mild B/L lower abdominal TTP. Extremities:  Without edema. Neurologic:  Alert and oriented x 4;  grossly normal neurologically. Psych:  Alert and cooperative. Normal mood and affect.  Intake/Output from previous day: 03/01 0701 - 03/02 0700 In: 1606.3 [P.O.:300; I.V.:1306.3] Out: 151 [Urine:151]  Lab Results:  Recent Labs  12/01/15 0958 12/01/15 1020 12/01/15 1617 12/02/15 0320  WBC 6.9  --  6.9 5.6  HGB 11.7* 11.6* 12.1 10.4*  HCT 35.8* 34.0* 36.3 32.1*  PLT 234  --  228 221   BMET  Recent Labs  12/01/15 0958 12/01/15 1020 12/02/15 0320  NA 142 142 141  K 3.7 3.6 3.6  CL 104 101 107  CO2 27  --  26  GLUCOSE 153* 145* 97  BUN 47* 41* 32*  CREATININE 1.19* 1.10* 0.97  CALCIUM 8.6*  --  8.1*   LFT  Recent Labs  12/01/15 0958  PROT 6.5  ALBUMIN 2.9*  AST 21  ALT 22  ALKPHOS 46  BILITOT 0.4   PT/INR  Recent Labs  12/01/15 0958  LABPROT 15.1  INR 1.18   Dg Chest 2 View  12/01/2015  CLINICAL DATA:  Cough EXAM: CHEST  2 VIEW COMPARISON:  10/04/2008 FINDINGS: There is cardiomegaly. Low lung volumes without confluent airspace opacity or effusion. No acute bony abnormality. IMPRESSION: Cardiomegaly.  Low lung volumes.  No active disease. Electronically Signed   By: Rolm Baptise M.D.   On:  12/01/2015 11:05   Assessment / Plan: *Acute hematochezia:  Suspect diverticular bleed.  Bleeding decreasing and only very minimal amount with brown stool this AM.  Hgb down just slightly this AM, which in part is likely dilutional.  Presumed diverticular bleed.  *S/p partial colectomy and chemotherapy 1998 for colon cancer of hepatic flexure. No recurrences as of latest colonoscopy in 2005.   *URI for the last week with cough and nasal congestion. Did have flu shot recently. CXR unrevealing.   *AKI:  Resolved.    -Would just continue to monitor for bleeding and watch hgb at this point.  Bleeding seems to be stopping and Hgb ok.    LOS: 1 day   ZEHR, JESSICA D.  12/02/2015, 9:18 AM  Pager number [phone]  GI ATTENDING  Interval history data reviewed. Patient personally seen and examined. Multiple family members and church pastor and room. Agree with above progress note as outlined. Patient is sitting comfortably in her chair. Hungry. Has had bowel movements without significant bleeding. Suspect self-limited diverticular bleed. Discussed with family. Conservative management most appropriate at this time. Will advance diet and continue to monitor.  Docia Chuck. Geri Seminole., M.D. [facility] Division of Gastroenterology

## 2015-12-02 NOTE — Progress Notes (Signed)
PROGRESS NOTE  Julia Robbins [id number] DOB: March 04, 1923 DOA: 12/01/2015 PCP: Merrilee Seashore, MD Brief History 82 year old female with a history of colon cancer status post partial colectomy and chemotherapy in 1998, DVT status post IVC filter and hypertension presented with acute onset of large volume hematochezia. The patient was going to the bathroom on 12/01/2015 when she had large-volume hematochezia and developed some dizziness. The patient denied any syncope, chest discomfort, shortness of breath, nausea, vomiting. The patient states that this has not occurred in the past. Colonoscopy 12/16/2003 was unremarkable. The patient has remained largely stable hemodynamically.  GI was consulted to assist in management.  Assessment/Plan: Acute blood loss anemia -Baseline hemoglobin approximately 13-14 -Presenting hemoglobin 11.7 -Monitor serial hemoglobin Hematochezia/Lower GIB -suspect diverticular, but now endorses abdominal pain (RLQ) intermittenly for 1 week -appreciate GI followup -transfuse as needed -had 2 additional episodes hematochezia since admission -continue clears for now Lactic Acidosis -lactic acid 3.71-->1.6 -due to volume depletion CKD stage 2-3 -baseline creatinine 0.9-1.1 Hx DVT -s/p IVC filter 1998 Colon Cancer Hx -T3N1, stage 3, 2/15 positive nodes at hepatic flexure, s/p colectomy and chemotherapy 1998. Low Serum B12 -marginally low -supplement Hyperlipidemia -continue statin  Family Communication:   Daughter Pam updated beside 12/02/15 Disposition Plan:   ALF when medically stable--?2-3 days       Procedures/Studies: Dg Chest 2 View  12/01/2015  CLINICAL DATA:  Cough EXAM: CHEST  2 VIEW COMPARISON:  10/04/2008 FINDINGS: There is cardiomegaly. Low lung volumes without confluent airspace opacity or effusion. No acute bony abnormality. IMPRESSION: Cardiomegaly.  Low lung volumes.  No active disease. Electronically Signed   By: Rolm Baptise  M.D.   On: 12/01/2015 11:05         Subjective: Pt c/o RLQ pain intermittenly.  2 bloody BMs last night.  Patient denies fevers, chills, headache, chest pain, dyspnea, nausea, vomiting, diarrhea,  dysuria, hematuria   Objective: Filed Vitals:   12/01/15 2300 12/02/15 0000 12/02/15 0400 12/02/15 0800  BP:      Pulse: 90     Temp:  98.4 F (36.9 C) 98.7 F (37.1 C) 98.2 F (36.8 C)  TempSrc:  Oral Oral   Resp: 21     Height:      SpO2: 98%       Intake/Output Summary (Last 24 hours) at 12/02/15 [id number] Last data filed at 12/02/15 0700  Gross per 24 hour  Intake 1606.25 ml  Output    151 ml  Net 1455.25 ml   Weight change:  Exam:   General:  Pt is alert, follows commands appropriately, not in acute distress  HEENT: No icterus, No thrush, No neck mass, [place]/AT  Cardiovascular: RRR, S1/S2, no rubs, no gallops  Respiratory: CTA bilaterally, no wheezing, no crackles, no rhonchi  Abdomen: Soft/+BS, RLQ pain without rebound, non distended, no guarding; no hepatosplenomegaly no cyanosis or clubbing  Extremities: trace LE edema, No lymphangitis, No petechiae, No rashes, no synovitis;   Data Reviewed: Basic Metabolic Panel:  Recent Labs Lab 12/01/15 0958 12/01/15 1020 12/02/15 0320  NA 142 142 141  K 3.7 3.6 3.6  CL 104 101 107  CO2 27  --  26  GLUCOSE 153* 145* 97  BUN 47* 41* 32*  CREATININE 1.19* 1.10* 0.97  CALCIUM 8.6*  --  8.1*   Liver Function Tests:  Recent Labs Lab 12/01/15 0958  AST 21  ALT 22  ALKPHOS 46  BILITOT 0.4  PROT 6.5  ALBUMIN 2.9*   No results for input(s): LIPASE, AMYLASE in the last 168 hours. No results for input(s): AMMONIA in the last 168 hours. CBC:  Recent Labs Lab 12/01/15 0958 12/01/15 1020 12/01/15 1617 12/02/15 0320  WBC 6.9  --  6.9 5.6  NEUTROABS 4.5  --   --   --   HGB 11.7* 11.6* 12.1 10.4*  HCT 35.8* 34.0* 36.3 32.1*  MCV 93.2  --  90.8 93.9  PLT 234  --  228 221   Cardiac Enzymes: No results for  input(s): CKTOTAL, CKMB, CKMBINDEX, TROPONINI in the last 168 hours. BNP: Invalid input(s): POCBNP CBG: No results for input(s): GLUCAP in the last 168 hours.  Recent Results (from the past 240 hour(s))  MRSA PCR Screening     Status: None   Collection Time: 12/01/15  4:52 PM  Result Value Ref Range Status   MRSA by PCR NEGATIVE NEGATIVE Final    Comment:        The GeneXpert MRSA Assay (FDA approved for NASAL specimens only), is one component of a comprehensive MRSA colonization surveillance program. It is not intended to diagnose MRSA infection nor to guide or monitor treatment for MRSA infections.      Scheduled Meds: . antiseptic oral rinse  7 mL Mouth Rinse BID  . ferrous sulfate  325 mg Oral Once per day on Mon Wed Fri  . nortriptyline  10 mg Oral BID  . pantoprazole (PROTONIX) IV  40 mg Intravenous Q12H  . pravastatin  40 mg Oral Daily   Continuous Infusions: . sodium chloride 75 mL/hr at 12/02/15 0700     Victoriya Pol, DO  [facility] Pager [phone]  If 7PM-7AM, please contact night-coverage [email or web address] Password [email or web address] 12/02/2015, 9:28 AM   LOS: 1 day

## 2015-12-03 DIAGNOSIS — K5791 Diverticulosis of intestine, part unspecified, without perforation or abscess with bleeding: Principal | ICD-10-CM

## 2015-12-03 DIAGNOSIS — K5731 Diverticulosis of large intestine without perforation or abscess with bleeding: Secondary | ICD-10-CM

## 2015-12-03 LAB — CBC
HCT: 30.9 % — ABNORMAL LOW (ref 36.0–46.0)
Hemoglobin: 10 g/dL — ABNORMAL LOW (ref 12.0–15.0)
MCH: 30.6 pg (ref 26.0–34.0)
MCHC: 32.4 g/dL (ref 30.0–36.0)
MCV: 94.5 fL (ref 78.0–100.0)
Platelets: 196 10*3/uL (ref 150–400)
RBC: 3.27 MIL/uL — ABNORMAL LOW (ref 3.87–5.11)
RDW: 13 % (ref 11.5–15.5)
WBC: 6.6 10*3/uL (ref 4.0–10.5)

## 2015-12-03 LAB — BASIC METABOLIC PANEL
Anion gap: 9 (ref 5–15)
BUN: 19 mg/dL (ref 6–20)
CO2: 23 mmol/L (ref 22–32)
Calcium: 8.5 mg/dL — ABNORMAL LOW (ref 8.9–10.3)
Chloride: 110 mmol/L (ref 101–111)
Creatinine, Ser: 0.89 mg/dL (ref 0.44–1.00)
GFR calc Af Amer: >60 mL/min (ref >60)
GFR calc non Af Amer: 55 mL/min — ABNORMAL LOW (ref >60)
Glucose, Bld: 104 mg/dL — ABNORMAL HIGH (ref 65–99)
Potassium: 4.1 mmol/L (ref 3.5–5.1)
Sodium: 142 mmol/L (ref 135–145)

## 2015-12-03 NOTE — Progress Notes (Signed)
     [facility] Progress Note  Subjective:  Feels much better today.  Looks Architectural technologist.  Tolerated diet.  Nurse reports brown liquid stool with what looked like maybe some old blood, not melena.  Objective:  Vital signs in last 24 hours: Temp:  [98.2 F (36.8 C)-98.9 F (37.2 C)] 98.2 F (36.8 C) (03/03 0800) Pulse Rate:  [80-104] 87 (03/03 0500) Resp:  [20-36] 22 (03/03 0500) BP: (98-116)/(49-65) 108/52 mmHg (03/03 0400) SpO2:  [97 %-100 %] 99 % (03/03 0500) Last BM Date: 12/03/15 General:  Alert, elderly/frail, in NAD Heart:  Regular rate and rhythm; no murmurs Pulm:  CTAB.  No W/R/R. Abdomen:  Soft, non-distended. BS present.  Mild B/L lower abdominal TTP. Extremities:  Without edema. Neurologic:  Alert and oriented x 4;  grossly normal neurologically. Psych:  Alert and cooperative. Normal mood and affect.  Intake/Output from previous day: 03/02 0701 - 03/03 0700 In: 620 [P.O.:620] Out: 500 [Urine:500]  Lab Results:  Recent Labs  12/01/15 1617 12/02/15 0320 12/03/15 0329  WBC 6.9 5.6 6.6  HGB 12.1 10.4* 10.0*  HCT 36.3 32.1* 30.9*  PLT 228 221 196   BMET  Recent Labs  12/01/15 0958 12/01/15 1020 12/02/15 0320 12/03/15 0329  NA 142 142 141 142  K 3.7 3.6 3.6 4.1  CL 104 101 107 110  CO2 27  --  26 23  GLUCOSE 153* 145* 97 104*  BUN 47* 41* 32* 19  CREATININE 1.19* 1.10* 0.97 0.89  CALCIUM 8.6*  --  8.1* 8.5*   LFT  Recent Labs  12/01/15 0958  PROT 6.5  ALBUMIN 2.9*  AST 21  ALT 22  ALKPHOS 46  BILITOT 0.4   PT/INR  Recent Labs  12/01/15 0958  LABPROT 15.1  INR 1.18   Dg Chest 2 View  12/01/2015  CLINICAL DATA:  Cough EXAM: CHEST  2 VIEW COMPARISON:  10/04/2008 FINDINGS: There is cardiomegaly. Low lung volumes without confluent airspace opacity or effusion. No acute bony abnormality. IMPRESSION: Cardiomegaly.  Low lung volumes.  No active disease. Electronically Signed   By: Rolm Baptise M.D.   On: 12/01/2015 11:05    Assessment / Plan: *Acute hematochezia: Suspect diverticular bleed. Bleeding decreasing and nursing reported only very minimal amount of what looked like old blood with liquid brown stool this AM. Hgb fairly stable this AM.  *S/p partial colectomy and chemotherapy 1998 for colon cancer of hepatic flexure. No recurrences as of latest colonoscopy in 2005.   *URI for > one week with cough and nasal congestion. Did have flu shot recently. CXR unrevealing.   *AKI: Resolved.   -Bleeding seems to be stopping and Hgb ok.  No plans for procedure at this time.  Continue conservative management.   LOS: 2 days   ZEHR, JESSICA D.  12/03/2015, 9:06 AM  Pager number [phone]  GI ATTENDING  Interval history data reviewed. Patient seen and examined. Agree with interval progress note. No active GI bleeding. Hemoglobin stable. Recommend diet of choice. Continue to monitor clinically. No additional GI recommendations. We're available if needed for questions or problems. Will sign off.  Docia Chuck. Geri Seminole., M.D. [facility] Division of Gastroenterology

## 2015-12-03 NOTE — Progress Notes (Signed)
PROGRESS NOTE  Julia Robbins [id number] DOB: 12-08-1922 DOA: 12/01/2015 PCP: Merrilee Seashore, MD  Brief History 82 year old female with a history of colon cancer status post partial colectomy and chemotherapy in 1998, DVT status post IVC filter and hypertension presented with acute onset of large volume hematochezia. The patient was going to the bathroom on 12/01/2015 when she had large-volume hematochezia and developed some dizziness. The patient denied any syncope, chest discomfort, shortness of breath, nausea, vomiting. The patient states that this has not occurred in the past. Colonoscopy 12/16/2003 was unremarkable. The patient has remained largely stable hemodynamically. GI was consulted to assist in management.  Assessment/Plan: Acute blood loss anemia -Baseline hemoglobin approximately 13-14 -Presenting hemoglobin 11.7 -Monitor serial hemoglobin Hematochezia/Lower GIB -suspect diverticular, but now endorses abdominal pain (RLQ) intermittenly for 1 week -appreciate GI followup-->no indication for endoscopy at this time -transfuse as needed -had 2 additional episodes hematochezia on 12/01/15, but none since then -advance diet Lactic Acidosis -lactic acid 3.71-->1.6 -due to volume depletion -improved with IVF CKD stage 2-3 -baseline creatinine 0.9-1.1 Hx DVT -s/p IVC filter 1998 Colon Cancer Hx -T3N1, stage 3, 2/15 positive nodes at hepatic flexure, s/p colectomy and chemotherapy 1998. Low Serum B12 -marginally low -supplement Hyperlipidemia -continue statin  Family Communication: Daughter Pam updated beside 12/03/15 Disposition Plan: home 12/04/15 if stable   Procedures/Studies: Dg Chest 2 View  12/01/2015  CLINICAL DATA:  Cough EXAM: CHEST  2 VIEW COMPARISON:  10/04/2008 FINDINGS: There is cardiomegaly. Low lung volumes without confluent airspace opacity or effusion. No acute bony abnormality. IMPRESSION: Cardiomegaly.  Low lung volumes.  No active  disease. Electronically Signed   By: Rolm Baptise M.D.   On: 12/01/2015 11:05         Subjective: Patient denies fevers, chills, headache, chest pain, dyspnea, nausea, vomiting, diarrhea, abdominal pain, dysuria, hematuria   Objective: Filed Vitals:   12/03/15 1200 12/03/15 1300 12/03/15 1400 12/03/15 1405  BP:    118/69  Pulse: 88 90 86 95  Temp:  98.8 F (37.1 C)    TempSrc:  Oral    Resp: 21 27 20 20   Height:      SpO2: 100% 100% 100% 100%    Intake/Output Summary (Last 24 hours) at 12/03/15 1537 Last data filed at 12/03/15 1400  Gross per 24 hour  Intake    120 ml  Output    425 ml  Net   -305 ml   Weight change:  Exam:   General:  Pt is alert, follows commands appropriately, not in acute distress  HEENT: No icterus, No thrush, No neck mass, [place]/AT  Cardiovascular: RRR, S1/S2, no rubs, no gallops  Respiratory: CTA bilaterally, no wheezing, no crackles, no rhonchi  Abdomen: Soft/+BS, non tender, non distended, no guarding  Extremities: No edema, No lymphangitis, No petechiae, No rashes, no synovitis  Data Reviewed: Basic Metabolic Panel:  Recent Labs Lab 12/01/15 0958 12/01/15 1020 12/02/15 0320 12/03/15 0329  NA 142 142 141 142  K 3.7 3.6 3.6 4.1  CL 104 101 107 110  CO2 27  --  26 23  GLUCOSE 153* 145* 97 104*  BUN 47* 41* 32* 19  CREATININE 1.19* 1.10* 0.97 0.89  CALCIUM 8.6*  --  8.1* 8.5*   Liver Function Tests:  Recent Labs Lab 12/01/15 0958  AST 21  ALT 22  ALKPHOS 46  BILITOT 0.4  PROT 6.5  ALBUMIN 2.9*   No results for input(s): LIPASE,  AMYLASE in the last 168 hours. No results for input(s): AMMONIA in the last 168 hours. CBC:  Recent Labs Lab 12/01/15 0958 12/01/15 1020 12/01/15 1617 12/02/15 0320 12/03/15 0329  WBC 6.9  --  6.9 5.6 6.6  NEUTROABS 4.5  --   --   --   --   HGB 11.7* 11.6* 12.1 10.4* 10.0*  HCT 35.8* 34.0* 36.3 32.1* 30.9*  MCV 93.2  --  90.8 93.9 94.5  PLT 234  --  228 221 196   Cardiac  Enzymes: No results for input(s): CKTOTAL, CKMB, CKMBINDEX, TROPONINI in the last 168 hours. BNP: Invalid input(s): POCBNP CBG: No results for input(s): GLUCAP in the last 168 hours.  Recent Results (from the past 240 hour(s))  MRSA PCR Screening     Status: None   Collection Time: 12/01/15  4:52 PM  Result Value Ref Range Status   MRSA by PCR NEGATIVE NEGATIVE Final    Comment:        The GeneXpert MRSA Assay (FDA approved for NASAL specimens only), is one component of a comprehensive MRSA colonization surveillance program. It is not intended to diagnose MRSA infection nor to guide or monitor treatment for MRSA infections.      Scheduled Meds: . antiseptic oral rinse  7 mL Mouth Rinse BID  . vitamin B-12  500 mcg Oral Daily  . ferrous sulfate  325 mg Oral Once per day on Mon Wed Fri  . nortriptyline  10 mg Oral BID  . pantoprazole (PROTONIX) IV  40 mg Intravenous Q12H  . pravastatin  40 mg Oral Daily   Continuous Infusions:    Nakiesha Rumsey, DO  [facility] Pager [phone]  If 7PM-7AM, please contact night-coverage [email or web address] Password [email or web address] 12/03/2015, 3:37 PM   LOS: 2 days

## 2015-12-04 LAB — CBC
HCT: 29.2 % — ABNORMAL LOW (ref 36.0–46.0)
Hemoglobin: 9.4 g/dL — ABNORMAL LOW (ref 12.0–15.0)
MCH: 30.2 pg (ref 26.0–34.0)
MCHC: 32.2 g/dL (ref 30.0–36.0)
MCV: 93.9 fL (ref 78.0–100.0)
Platelets: 203 10*3/uL (ref 150–400)
RBC: 3.11 MIL/uL — ABNORMAL LOW (ref 3.87–5.11)
RDW: 12.9 % (ref 11.5–15.5)
WBC: 7 10*3/uL (ref 4.0–10.5)

## 2015-12-04 MED ORDER — CYANOCOBALAMIN 500 MCG PO TABS: 500.0000 ug | ORAL_TABLET | Freq: Every day | ORAL | Status: DC

## 2015-12-04 NOTE — Care Management Note (Signed)
Case Management Note  Patient Details  Name: Julia TRUGLIO MRN: [id number] Date of Birth: [birth year]  Subjective/Objective:     Hematochezia/Lower GIB, anemia               Action/Plan: NCM spoke to pt at bedside. Gave permission to speak to dtr, Jed Limerick # [phone]. No PT recommended. Pt reports she has RW and cane. Pt has family at home to support her as needed.    Expected Discharge Date:  12/04/2015               Expected Discharge Plan:  Home/Self Care  In-House Referral:  NA  Discharge planning Services  CM Consult  Post Acute Care Choice:  NA Choice offered to:  NA  DME Arranged:  N/A DME Agency:  NA  HH Arranged:  NA HH Agency:  NA  Status of Service:  Completed, signed off  Medicare Important Message Given:  Yes Date Medicare IM Given:    Medicare IM give by:    Date Additional Medicare IM Given:    Additional Medicare Important Message give by:     If discussed at [place] of Stay Meetings, dates discussed:    Additional Comments:  Erenest Rasher, RN 12/04/2015, 6:19 PM

## 2015-12-04 NOTE — Care Management Important Message (Signed)
Important Message  Patient Details  Name: Julia Robbins MRN: [id number] Date of Birth: 06-04-1923   Medicare Important Message Given:  Yes    Erenest Rasher, RN 12/04/2015, 1:41 PM

## 2015-12-04 NOTE — Discharge Summary (Signed)
Physician Discharge Summary  Julia Robbins [id number] DOB: [birth year] DOA: 12/01/2015  PCP: Merrilee Seashore, MD  Admit date: 12/01/2015 Discharge date: 12/04/2015  Recommendations for Outpatient Follow-up:  1. Pt will need to follow up with PCP in 2 weeks post discharge 2. Please obtain cbc in 1-2 weeks  Discharge Diagnoses:  Acute blood loss anemia -Baseline hemoglobin approximately 13-14 -Presenting hemoglobin 11.7 -Monitor serial hemoglobin Hematochezia/Lower GIB -suspect diverticular, but now endorses abdominal pain (RLQ) intermittenly for 1 week -appreciate GI followup-->no indication for endoscopy at this time -transfuse as needed -had 2 additional episodes hematochezia on 12/01/15, but none since then -advance diet-->pt tolerated Lactic Acidosis -lactic acid 3.71-->1.6 -due to volume depletion -improved with IVF CKD stage 2-3 -baseline creatinine 0.9-1.1 Hx DVT -s/p IVC filter 1998 Colon Cancer Hx -T3N1, stage 3, 2/15 positive nodes at hepatic flexure, s/p colectomy and chemotherapy 1998. Low Serum B12 -marginally low--272 -supplement B12 started Hyperlipidemia -continue statin  Discharge Condition: stable  Disposition: home  Diet:heart healthy Wt Readings from Last 3 Encounters:  12/03/15 81.8 kg (180 lb 5.4 oz)  04/15/15 79.47 kg (175 lb 3.2 oz)  04/17/14 78.382 kg (172 lb 12.8 oz)    History of present illness:  82 year old female with a history of colon cancer status post partial colectomy and chemotherapy in 1998, DVT status post IVC filter and hypertension presented with acute onset of large volume hematochezia. The patient was going to the bathroom on 12/01/2015 when she had large-volume hematochezia and developed some dizziness. The patient denied any syncope, chest discomfort, shortness of breath, nausea, vomiting. The patient states that this has not occurred in the past. Colonoscopy 12/16/2003 was unremarkable. The patient has remained largely  stable hemodynamically. GI was consulted to assist in management.  They did not feel any intervention was needed.  Hematochezia stopped after 24hr as pt has further BMs without blood.  Consultants: [facility]  Discharge Exam: Filed Vitals:   12/03/15 2040 12/04/15 0501  BP: 115/69 95/48  Pulse: 83 84  Temp: 98.6 F (37 C) 98.3 F (36.8 C)  Resp: 20 19   Filed Vitals:   12/03/15 1600 12/03/15 1700 12/03/15 2040 12/04/15 0501  BP: 120/49  115/69 95/48  Pulse: 92 90 83 84  Temp: 98.2 F (36.8 C)  98.6 F (37 C) 98.3 F (36.8 C)  TempSrc: Oral  Oral Oral  Resp: 24 24 20 19   Height:   5\' 5"  (1.651 m)   Weight:   81.8 kg (180 lb 5.4 oz)   SpO2: 100% 100% 100% 100%   General: A&O x 3, NAD, pleasant, cooperative Cardiovascular: RRR, no rub, no gallop, no S3 Respiratory: CTAB, no wheeze, no rhonchi Abdomen:soft, nontender, nondistended, positive bowel sounds Extremities: No edema, No lymphangitis, no petechiae  Discharge Instructions     Medication List    TAKE these medications        acetaminophen 500 MG tablet  Commonly known as:  TYLENOL  Take 1,000 mg by mouth 2 (two) times daily as needed for moderate pain or headache.     ALKA-SELTZER PLUS DAY COLD/FLU PO  Take 2 tablets by mouth daily as needed (cold symptoms).     cyanocobalamin 500 MCG tablet  Take 1 tablet (500 mcg total) by mouth daily.     cyclobenzaprine 10 MG tablet  Commonly known as:  FLEXERIL  Take 10 mg by mouth 2 (two) times daily as needed for muscle spasms.     DELSYM 30 MG/5ML liquid  Generic drug:  dextromethorphan  Take 60 mg by mouth as needed for cough.     ferrous sulfate 325 (65 FE) MG tablet  Take 325 mg by mouth 3 (three) times a week.     furosemide 40 MG tablet  Commonly known as:  LASIX  Take 80 mg by mouth daily.     hydrOXYzine 25 MG tablet  Commonly known as:  ATARAX/VISTARIL  Take 25 mg by mouth at bedtime as needed for itching.     multivitamin capsule  Take 1  capsule by mouth daily.     nortriptyline 10 MG capsule  Commonly known as:  PAMELOR  Take 10 mg by mouth 2 (two) times daily.     oxyCODONE-acetaminophen 5-325 MG tablet  Commonly known as:  PERCOCET  Take 2 tablets by mouth every 4 (four) hours as needed for pain.     potassium chloride SA 20 MEQ tablet  Commonly known as:  K-DUR,KLOR-CON  Take 20 mEq by mouth 2 (two) times daily.     pravastatin 40 MG tablet  Commonly known as:  PRAVACHOL  Take 40 mg by mouth daily.     traMADol 50 MG tablet  Commonly known as:  ULTRAM  Take 50 mg by mouth every 6 (six) hours as needed for moderate pain.     vitamin C 1000 MG tablet  Take 1,000 mg by mouth daily.     VITAMIN D-3 PO  Take 1 tablet by mouth daily.         The results of significant diagnostics from this hospitalization (including imaging, microbiology, ancillary and laboratory) are listed below for reference.    Significant Diagnostic Studies: Dg Chest 2 View  12/01/2015  CLINICAL DATA:  Cough EXAM: CHEST  2 VIEW COMPARISON:  10/04/2008 FINDINGS: There is cardiomegaly. Low lung volumes without confluent airspace opacity or effusion. No acute bony abnormality. IMPRESSION: Cardiomegaly.  Low lung volumes.  No active disease. Electronically Signed   By: Rolm Baptise M.D.   On: 12/01/2015 11:05     Microbiology: Recent Results (from the past 240 hour(s))  MRSA PCR Screening     Status: None   Collection Time: 12/01/15  4:52 PM  Result Value Ref Range Status   MRSA by PCR NEGATIVE NEGATIVE Final    Comment:        The GeneXpert MRSA Assay (FDA approved for NASAL specimens only), is one component of a comprehensive MRSA colonization surveillance program. It is not intended to diagnose MRSA infection nor to guide or monitor treatment for MRSA infections.      Labs: Basic Metabolic Panel:  Recent Labs Lab 12/01/15 0958 12/01/15 1020 12/02/15 0320 12/03/15 0329  NA 142 142 141 142  K 3.7 3.6 3.6 4.1  CL  104 101 107 110  CO2 27  --  26 23  GLUCOSE 153* 145* 97 104*  BUN 47* 41* 32* 19  CREATININE 1.19* 1.10* 0.97 0.89  CALCIUM 8.6*  --  8.1* 8.5*   Liver Function Tests:  Recent Labs Lab 12/01/15 0958  AST 21  ALT 22  ALKPHOS 46  BILITOT 0.4  PROT 6.5  ALBUMIN 2.9*   No results for input(s): LIPASE, AMYLASE in the last 168 hours. No results for input(s): AMMONIA in the last 168 hours. CBC:  Recent Labs Lab 12/01/15 0958 12/01/15 1020 12/01/15 1617 12/02/15 0320 12/03/15 0329 12/04/15 0626  WBC 6.9  --  6.9 5.6 6.6 7.0  NEUTROABS 4.5  --   --   --   --   --  HGB 11.7* 11.6* 12.1 10.4* 10.0* 9.4*  HCT 35.8* 34.0* 36.3 32.1* 30.9* 29.2*  MCV 93.2  --  90.8 93.9 94.5 93.9  PLT 234  --  228 221 196 203   Cardiac Enzymes: No results for input(s): CKTOTAL, CKMB, CKMBINDEX, TROPONINI in the last 168 hours. BNP: Invalid input(s): POCBNP CBG: No results for input(s): GLUCAP in the last 168 hours.  Time coordinating discharge:  Greater than 30 minutes  Signed:  Inge Waldroup, DO [facility] Pager: [phone] 12/04/2015, 12:42 PM

## 2015-12-04 NOTE — Evaluation (Signed)
Physical Therapy Evaluation-1x Patient Details Name: DORSEY COBY MRN: [id number] DOB: 07-12-1923 Today's Date: 12/04/2015   History of Present Illness  82 yo female admitted with GI bleed. Hx of colon cancer. Pt is [facility]  Clinical Impression  On eval, pt was supervision level for mobility-walked ~115 feet with RW. No LOB. No c/o dizziness. Instructed pt to continue using her walker.     Follow Up Recommendations No PT follow up    Equipment Recommendations  None recommended by PT    Recommendations for Other Services       Precautions / Restrictions Precautions Precautions: Fall Restrictions Weight Bearing Restrictions: No      Mobility  Bed Mobility               General bed mobility comments: pt oob in recliner  Transfers Overall transfer level: Needs assistance Equipment used: Rolling walker (2 wheeled) Transfers: Sit to/from Stand Sit to Stand: Supervision            Ambulation/Gait Ambulation/Gait assistance: Supervision Ambulation Distance (Feet): 115 Feet Assistive device: Rolling walker (2 wheeled) Gait Pattern/deviations: Step-through pattern;Decreased stride length     General Gait Details: slow but steady gait speed with use of walker. Pt tolerated distance well.   Stairs            Wheelchair Mobility    Modified Rankin (Stroke Patients Only)       Balance Overall balance assessment: Needs assistance         Standing balance support: Bilateral upper extremity supported;During functional activity Standing balance-Leahy Scale: Poor Standing balance comment: requires use of walker                             Pertinent Vitals/Pain Pain Assessment: No/denies pain    Home Living Family/patient expects to be discharged to:: Private residence Living Arrangements: Alone   Type of Home: Apartment Home Access: Elevator     Home Layout: One level Home Equipment: Cane -  quad;Grab bars - toilet;Walker - 4 wheels (3 wheeled walker)      Prior Function Level of Independence: Independent with assistive device(s)         Comments: uses walker     Hand Dominance        Extremity/Trunk Assessment   Upper Extremity Assessment: Overall WFL for tasks assessed           Lower Extremity Assessment: Overall WFL for tasks assessed      Cervical / Trunk Assessment: Normal  Communication   Communication: No difficulties  Cognition Arousal/Alertness: Awake/alert Behavior During Therapy: WFL for tasks assessed/performed Overall Cognitive Status: Within Functional Limits for tasks assessed                      General Comments      Exercises        Assessment/Plan    PT Assessment Patent does not need any further PT services  PT Diagnosis     PT Problem List    PT Treatment Interventions     PT Goals (Current goals can be found in the Care Plan section) Acute Rehab PT Goals Patient Stated Goal: home today PT Goal Formulation: All assessment and education complete, DC therapy    Frequency     Barriers to discharge        Co-evaluation  End of Session   Activity Tolerance: Patient tolerated treatment well Patient left: in chair;with call bell/phone within reach           Time: 1325-1341 PT Time Calculation (min) (ACUTE ONLY): 16 min   Charges:   PT Evaluation $PT Eval Low Complexity: 1 Procedure     PT G Codes:        Weston Anna, MPT Pager: [phone]

## 2015-12-05 LAB — TYPE AND SCREEN
ABO/RH(D): B POS
Antibody Screen: NEGATIVE
Unit division: 0
Unit division: 0

## 2015-12-05 NOTE — Progress Notes (Signed)
Went over d/c instructions with patient and daughter.  Both verbalized understanding.  Patient left hospital via w/c with daughter and all belongings.

## 2015-12-20 DIAGNOSIS — R600 Localized edema: Secondary | ICD-10-CM | POA: Diagnosis not present

## 2015-12-23 DIAGNOSIS — K625 Hemorrhage of anus and rectum: Secondary | ICD-10-CM | POA: Diagnosis not present

## 2015-12-23 DIAGNOSIS — R269 Unspecified abnormalities of gait and mobility: Secondary | ICD-10-CM | POA: Diagnosis not present

## 2015-12-23 DIAGNOSIS — E782 Mixed hyperlipidemia: Secondary | ICD-10-CM | POA: Diagnosis not present

## 2015-12-23 DIAGNOSIS — Z09 Encounter for follow-up examination after completed treatment for conditions other than malignant neoplasm: Secondary | ICD-10-CM | POA: Diagnosis not present

## 2016-01-19 DIAGNOSIS — G609 Hereditary and idiopathic neuropathy, unspecified: Secondary | ICD-10-CM | POA: Diagnosis not present

## 2016-01-19 DIAGNOSIS — F0390 Unspecified dementia without behavioral disturbance: Secondary | ICD-10-CM | POA: Diagnosis not present

## 2016-01-19 DIAGNOSIS — E782 Mixed hyperlipidemia: Secondary | ICD-10-CM | POA: Diagnosis not present

## 2016-02-17 DIAGNOSIS — E782 Mixed hyperlipidemia: Secondary | ICD-10-CM | POA: Diagnosis not present

## 2016-02-17 DIAGNOSIS — G609 Hereditary and idiopathic neuropathy, unspecified: Secondary | ICD-10-CM | POA: Diagnosis not present

## 2016-02-17 DIAGNOSIS — F0390 Unspecified dementia without behavioral disturbance: Secondary | ICD-10-CM | POA: Diagnosis not present

## 2016-02-24 DIAGNOSIS — L602 Onychogryphosis: Secondary | ICD-10-CM | POA: Diagnosis not present

## 2016-05-25 DIAGNOSIS — L602 Onychogryphosis: Secondary | ICD-10-CM | POA: Diagnosis not present

## 2016-05-25 DIAGNOSIS — L84 Corns and callosities: Secondary | ICD-10-CM | POA: Diagnosis not present

## 2016-09-07 DIAGNOSIS — E782 Mixed hyperlipidemia: Secondary | ICD-10-CM | POA: Diagnosis not present

## 2016-09-14 DIAGNOSIS — R1031 Right lower quadrant pain: Secondary | ICD-10-CM | POA: Diagnosis not present

## 2016-09-14 DIAGNOSIS — E782 Mixed hyperlipidemia: Secondary | ICD-10-CM | POA: Diagnosis not present

## 2016-09-14 DIAGNOSIS — Z23 Encounter for immunization: Secondary | ICD-10-CM | POA: Diagnosis not present

## 2016-09-14 DIAGNOSIS — M310 Hypersensitivity angiitis: Secondary | ICD-10-CM | POA: Diagnosis not present

## 2017-01-11 DIAGNOSIS — Z961 Presence of intraocular lens: Secondary | ICD-10-CM | POA: Diagnosis not present

## 2017-01-31 DIAGNOSIS — E782 Mixed hyperlipidemia: Secondary | ICD-10-CM | POA: Diagnosis not present

## 2017-01-31 DIAGNOSIS — Z0000 Encounter for general adult medical examination without abnormal findings: Secondary | ICD-10-CM | POA: Diagnosis not present

## 2017-02-14 ENCOUNTER — Ambulatory Visit
Admission: RE | Admit: 2017-02-14 | Discharge: 2017-02-14 | Disposition: A | Discharge status: Home / Self Care | Payer: Medicare Other | Source: Ambulatory Visit | Attending: Internal Medicine | Admitting: Internal Medicine

## 2017-02-14 ENCOUNTER — Other Ambulatory Visit: Payer: Self-pay | Admitting: Internal Medicine

## 2017-02-14 DIAGNOSIS — R1031 Right lower quadrant pain: Secondary | ICD-10-CM

## 2017-02-14 DIAGNOSIS — E782 Mixed hyperlipidemia: Secondary | ICD-10-CM | POA: Diagnosis not present

## 2017-02-14 DIAGNOSIS — R52 Pain, unspecified: Secondary | ICD-10-CM

## 2017-02-14 DIAGNOSIS — Z23 Encounter for immunization: Secondary | ICD-10-CM | POA: Diagnosis not present

## 2017-02-14 DIAGNOSIS — K8020 Calculus of gallbladder without cholecystitis without obstruction: Secondary | ICD-10-CM | POA: Diagnosis not present

## 2017-02-14 DIAGNOSIS — R198 Other specified symptoms and signs involving the digestive system and abdomen: Secondary | ICD-10-CM

## 2017-02-14 DIAGNOSIS — N183 Chronic kidney disease, stage 3 (moderate): Secondary | ICD-10-CM | POA: Diagnosis not present

## 2017-02-14 DIAGNOSIS — I129 Hypertensive chronic kidney disease with stage 1 through stage 4 chronic kidney disease, or unspecified chronic kidney disease: Secondary | ICD-10-CM | POA: Diagnosis not present

## 2017-02-14 MED ORDER — IOPAMIDOL (ISOVUE-300) INJECTION 61%
75.0000 mL | Freq: Once | INTRAVENOUS | Status: AC | PRN
  Administered 2017-02-14: 75 mL via INTRAVENOUS

## 2017-02-15 ENCOUNTER — Telehealth: Payer: Self-pay | Admitting: Gastroenterology

## 2017-02-15 ENCOUNTER — Encounter: Payer: Self-pay | Admitting: Physician Assistant

## 2017-02-15 NOTE — Telephone Encounter (Signed)
Per Vaughan Basta, (Dr. Henrene Pastor Doc of the Day). Schedule patient for next available. I offered earlier appt but patient daughter states that she is off every other Wednesday. Patient daughter requested 02/28/17

## 2017-02-28 ENCOUNTER — Encounter: Payer: Self-pay | Admitting: Physician Assistant

## 2017-02-28 ENCOUNTER — Ambulatory Visit (INDEPENDENT_AMBULATORY_CARE_PROVIDER_SITE_OTHER)
Admission: RE | Admit: 2017-02-28 | Discharge: 2017-02-28 | Disposition: A | Discharge status: Home / Self Care | Payer: Medicare Other | Source: Ambulatory Visit | Attending: Physician Assistant | Admitting: Physician Assistant

## 2017-02-28 ENCOUNTER — Ambulatory Visit (INDEPENDENT_AMBULATORY_CARE_PROVIDER_SITE_OTHER): Payer: Medicare Other | Admitting: Physician Assistant

## 2017-02-28 VITALS — BP 140/74 | HR 96 | Ht 65.0 in | Wt 170.9 lb

## 2017-02-28 DIAGNOSIS — R1031 Right lower quadrant pain: Secondary | ICD-10-CM

## 2017-02-28 DIAGNOSIS — Z85038 Personal history of other malignant neoplasm of large intestine: Secondary | ICD-10-CM | POA: Diagnosis not present

## 2017-02-28 DIAGNOSIS — R935 Abnormal findings on diagnostic imaging of other abdominal regions, including retroperitoneum: Secondary | ICD-10-CM

## 2017-02-28 DIAGNOSIS — M25551 Pain in right hip: Secondary | ICD-10-CM | POA: Diagnosis not present

## 2017-02-28 MED ORDER — NA SULFATE-K SULFATE-MG SULF 17.5-3.13-1.6 GM/177ML PO SOLN: 1.0000 | Freq: Once | ORAL | 0 refills | Status: DC

## 2017-02-28 NOTE — Progress Notes (Signed)
Subjective:    Patient ID: Julia Robbins, female    DOB: July 08, 1923, 83 y.o.   MRN: [id number]  HPI Julia Robbins is a pleasant 83 year old African-American female, known remotely to Dr. Ardis Hughs and referred today by Dr. Ashby Dawes for complaints of right lower quadrant pain and an abnormal CT of the abdomen and pelvis. Patient has history of colon cancer diagnosed in 1998, she is status post resection, and had follow-up colonoscopies last done in 2003 and again in 2005 per Dr.Orr. These were both negative with the exception of diverticulosis. She was seen by Dr. Ardis Hughs in 2011 and at that time decided not to pursue further screening colonoscopy. Patient says she has been having some intermittent which she describes as right lower quadrant discomfort over the past 7 or 8 months. This is not constant but rather comes and goes and is described as sharp . Pain does not seem to be associated with by mouth intake or bowel movements. Her appetite is been fine her weight has been stable. She has been having regular bowel movements without any melena or hematochezia. Most recent labs done on 01/31/2017 per Dr. Hilton Cork office showed hemoglobin 13.6 hematocrit of 41.6 WBC 5.7 platelets 212 BUN 22, creatinine 1.24 LFTs within normal limits.   CT of the abdomen and pelvis was done on 02/14/2017 which shows multiple gallstones and dilation of the intra-and extrahepatic ducts all of which is chronic since 2010. Also noted a soft tissue mass arising from the medial gastric fundus just distal to the GE junction and measuring 4 x 3.2 x 3.3 cm, she also has a duodenal diverticulum and multiple diverticuli in the remaining colon without evidence of diverticulitis.    Review of Systems Pertinent positive and negative review of systems were noted in the above HPI section.  All other review of systems was otherwise negative.  Outpatient Encounter Prescriptions as of 02/28/2017  Medication Sig  . acetaminophen  (TYLENOL) 500 MG tablet Take 1,000 mg by mouth 2 (two) times daily as needed for moderate pain or headache.  . Ascorbic Acid (VITAMIN C) 1000 MG tablet Take 1,000 mg by mouth daily.  . Cholecalciferol (VITAMIN D-3 PO) Take 1 tablet by mouth daily.  . cyanocobalamin 500 MCG tablet Take 1 tablet (500 mcg total) by mouth daily.  . cyclobenzaprine (FLEXERIL) 10 MG tablet Take 10 mg by mouth 2 (two) times daily as needed for muscle spasms.   Marland Kitchen dextromethorphan (DELSYM) 30 MG/5ML liquid Take 60 mg by mouth as needed for cough.  Marland Kitchen DM-Phenylephrine-Acetaminophen (ALKA-SELTZER PLUS DAY COLD/FLU PO) Take 2 tablets by mouth daily as needed (cold symptoms).  . ferrous sulfate 325 (65 FE) MG tablet Take 325 mg by mouth daily with breakfast.   . furosemide (LASIX) 40 MG tablet Take 80 mg by mouth daily.   . hydrOXYzine (ATARAX/VISTARIL) 25 MG tablet Take 25 mg by mouth at bedtime as needed for itching.   . Multiple Vitamin (MULTIVITAMIN) capsule Take 1 capsule by mouth daily.  . nortriptyline (PAMELOR) 10 MG capsule Take 10 mg by mouth 2 (two) times daily.  Marland Kitchen oxyCODONE-acetaminophen (PERCOCET) 5-325 MG per tablet Take 2 tablets by mouth every 4 (four) hours as needed for pain.  . potassium chloride SA (K-DUR,KLOR-CON) 20 MEQ tablet Take 20 mEq by mouth 2 (two) times daily.   . pravastatin (PRAVACHOL) 40 MG tablet Take 40 mg by mouth daily.  . traMADol (ULTRAM) 50 MG tablet Take 50 mg by mouth every 6 (six) hours as  needed for moderate pain.  . [DISCONTINUED] Na Sulfate-K Sulfate-Mg Sulf 17.5-3.13-1.6 GM/180ML SOLN Take 1 kit by mouth once.   No facility-administered encounter medications on file as of 02/28/2017.    No Known Allergies Patient Active Problem List   Diagnosis Date Noted  . Acute blood loss anemia 12/02/2015  . CKD (chronic kidney disease) stage 3, GFR 30-59 ml/min 12/02/2015  . Lower GI bleed   . GI bleed 12/01/2015  . Acute kidney injury ([place]) 12/01/2015  . Elevated lactic acid level  12/01/2015  . Essential hypertension 12/01/2015  . History of DVT (deep vein thrombosis) 10/31/2012  . Hyperlipidemia 10/31/2012  . History of colon cancer 03/29/2010   Social History   Social History  . Marital status: Widowed    Spouse name: N/A  . Number of children: N/A  . Years of education: N/A   Occupational History  . Not on file.   Social History Main Topics  . Smoking status: Never Smoker  . Smokeless tobacco: Never Used  . Alcohol use No  . Drug use: No  . Sexual activity: No   Other Topics Concern  . Not on file   Social History Narrative  . No narrative on file    Ms. Ditommaso's family history includes Heart disease in her mother.      Objective:    Vitals:   02/28/17 1028  BP: 140/74  Pulse: 96    Physical Exam  well-developed elderly African-American female in no acute distress, she is accompanied by her daughter, ambulates with a walker. Blood pressure 140/74 pulse 96, height 5 foot 5, weight 170, BMI 28.4. HEENT ;nontraumatic normocephalic EOMI PERRLA sclera anicteric, Cardiovascular ;regular rate and rhythm with S1-S2, Pulmonary ;clear bilaterally, she is kyphotic Abdomen; soft, bowel sounds are present she has a midline incisional scar there is no upper abdominal tenderness. She has some mild discomfort along the right iliac crest, no true right lower quadrant tenderness, no palpable mass or hepatosplenomegaly  Rectal ;exam not done, Extremities; no clubbing, cyanosis or edema skin warm and dry, Neuropsych ;mood and affect appropriate       Assessment & Plan:   #24 83 year old female with complaints of intermittent sharp right lower quadrant pain 7 date months. CT scan is unrevealing as to cause. On exam this seems more consistent with a musculoskeletal type pain with tenderness to palpation over the iliac on the right. #2 abnormal CT scan showing a soft tissue mass in the gastric fundus-patient has no referral bowel symptoms. Will need to rule out  benign versus malignant lesion question leiomyoma #3 colonic diverticulosis #4 history of colon cancer diagnosed 1998 status post resection. Last colonoscopy 2005 negative #5 hypertension #6 chronic kidney disease #7 osteoarthritis  Plan; Discussed CT findings in detail with the patient and her daughter. Will schedule for upper endoscopy with Dr. Ardis Hughs with biopsy. Procedure discussed in detail with patient including risks and benefits and she is agreeable to proceed We'll check right hip films We discussed option of colonoscopy to further investigate the right lower quadrant pain. Again CT unrevealing. She would like to proceed with EGD initially.  Serafina Topham S Ashonte Angelucci PA-C 02/28/2017   Cc: Merrilee Seashore, MD

## 2017-02-28 NOTE — Progress Notes (Signed)
I agree with the above note, plan 

## 2017-02-28 NOTE — Patient Instructions (Addendum)
You have been scheduled for an endoscopy. Please follow written instructions given to you at your visit today. If you use inhalers (even only as needed), please bring them with you on the day of your procedure. Your physician has requested that you go to www.startemmi.com and enter the access code given to you at your visit today. This web site gives a general overview about your procedure. However, you should still follow specific instructions given to you by our office regarding your preparation for the procedure.  Your provider has requested that you have a hip x ray before leaving today. Please go to the basement floor to our Radiology department for the test.  If you are age 9 or older, your body mass index should be between 23-30. Your Body mass index is 28.44 kg/m. If this is out of the aforementioned range listed, please consider follow up with your Primary Care Provider.  If you are age 35 or younger, your body mass index should be between 19-25. Your Body mass index is 28.44 kg/m. If this is out of the aformentioned range listed, please consider follow up with your Primary Care Provider.

## 2017-03-01 ENCOUNTER — Telehealth: Payer: Self-pay | Admitting: *Deleted

## 2017-03-01 NOTE — Telephone Encounter (Signed)
I have spoken to patient's daughter, Olin Hauser to advise that patient has been scheduled at [facility] on 03/08/17 @ 10:45 am (9:15 am arrival) for endoscopy with propofol to be completed by Dr Ardis Hughs. She has already been given prep instructions in the office as well and verbalizes understanding of this.

## 2017-03-07 ENCOUNTER — Encounter (HOSPITAL_COMMUNITY): Payer: Self-pay | Admitting: *Deleted

## 2017-03-07 NOTE — Progress Notes (Signed)
ekg 02-14-17, ekg 11-17-16, ekg 09-16-13, ekg 09-10-12 all from dr Baxter Flattery all show unchanged left bbb.

## 2017-03-08 ENCOUNTER — Ambulatory Visit (HOSPITAL_COMMUNITY): Payer: Medicare Other | Admitting: Anesthesiology

## 2017-03-08 ENCOUNTER — Encounter (HOSPITAL_COMMUNITY)
Admission: RE | Disposition: A | Discharge status: Home / Self Care | Payer: Self-pay | Source: Ambulatory Visit | Attending: Gastroenterology

## 2017-03-08 ENCOUNTER — Ambulatory Visit (HOSPITAL_COMMUNITY)
Admission: RE | Admit: 2017-03-08 | Discharge: 2017-03-08 | Disposition: A | Discharge status: Home / Self Care | Payer: Medicare Other | Source: Ambulatory Visit | Attending: Gastroenterology | Admitting: Gastroenterology

## 2017-03-08 ENCOUNTER — Encounter (HOSPITAL_COMMUNITY): Payer: Self-pay | Admitting: *Deleted

## 2017-03-08 DIAGNOSIS — Z8719 Personal history of other diseases of the digestive system: Secondary | ICD-10-CM | POA: Diagnosis not present

## 2017-03-08 DIAGNOSIS — Z79899 Other long term (current) drug therapy: Secondary | ICD-10-CM | POA: Insufficient documentation

## 2017-03-08 DIAGNOSIS — R935 Abnormal findings on diagnostic imaging of other abdominal regions, including retroperitoneum: Secondary | ICD-10-CM | POA: Diagnosis not present

## 2017-03-08 DIAGNOSIS — K3189 Other diseases of stomach and duodenum: Secondary | ICD-10-CM | POA: Insufficient documentation

## 2017-03-08 DIAGNOSIS — R1031 Right lower quadrant pain: Secondary | ICD-10-CM

## 2017-03-08 DIAGNOSIS — E785 Hyperlipidemia, unspecified: Secondary | ICD-10-CM | POA: Diagnosis not present

## 2017-03-08 DIAGNOSIS — I129 Hypertensive chronic kidney disease with stage 1 through stage 4 chronic kidney disease, or unspecified chronic kidney disease: Secondary | ICD-10-CM | POA: Insufficient documentation

## 2017-03-08 DIAGNOSIS — R933 Abnormal findings on diagnostic imaging of other parts of digestive tract: Secondary | ICD-10-CM

## 2017-03-08 DIAGNOSIS — N183 Chronic kidney disease, stage 3 (moderate): Secondary | ICD-10-CM | POA: Insufficient documentation

## 2017-03-08 DIAGNOSIS — Z85038 Personal history of other malignant neoplasm of large intestine: Secondary | ICD-10-CM | POA: Diagnosis not present

## 2017-03-08 DIAGNOSIS — K449 Diaphragmatic hernia without obstruction or gangrene: Secondary | ICD-10-CM | POA: Insufficient documentation

## 2017-03-08 DIAGNOSIS — R1032 Left lower quadrant pain: Secondary | ICD-10-CM | POA: Insufficient documentation

## 2017-03-08 DIAGNOSIS — D490 Neoplasm of unspecified behavior of digestive system: Secondary | ICD-10-CM

## 2017-03-08 DIAGNOSIS — Z86718 Personal history of other venous thrombosis and embolism: Secondary | ICD-10-CM | POA: Insufficient documentation

## 2017-03-08 DIAGNOSIS — M1990 Unspecified osteoarthritis, unspecified site: Secondary | ICD-10-CM | POA: Diagnosis not present

## 2017-03-08 DIAGNOSIS — K922 Gastrointestinal hemorrhage, unspecified: Secondary | ICD-10-CM | POA: Diagnosis not present

## 2017-03-08 HISTORY — DX: Abnormal electrocardiogram (ECG) (EKG): R94.31

## 2017-03-08 HISTORY — PX: ESOPHAGOGASTRODUODENOSCOPY (EGD) WITH PROPOFOL: SHX5813

## 2017-03-08 HISTORY — DX: Unspecified hearing loss, unspecified ear: H91.90

## 2017-03-08 SURGERY — ESOPHAGOGASTRODUODENOSCOPY (EGD) WITH PROPOFOL
Anesthesia: Monitor Anesthesia Care

## 2017-03-08 MED ORDER — PROPOFOL 500 MG/50ML IV EMUL
INTRAVENOUS | Status: DC | PRN
Start: 2017-03-08 — End: 2017-03-08
  Administered 2017-03-08: 80 ug/kg/min via INTRAVENOUS

## 2017-03-08 MED ORDER — PROPOFOL 10 MG/ML IV BOLUS
INTRAVENOUS | Status: DC | PRN
  Administered 2017-03-08 (×2): 30 mg via INTRAVENOUS
  Administered 2017-03-08: 10 mg via INTRAVENOUS
  Administered 2017-03-08: 30 mg via INTRAVENOUS

## 2017-03-08 MED ORDER — EPINEPHRINE PF 1 MG/10ML IJ SOSY
PREFILLED_SYRINGE | INTRAMUSCULAR | Status: AC
Start: 2017-03-08 — End: 2017-03-08
  Filled 2017-03-08: qty 10

## 2017-03-08 MED ORDER — SODIUM CHLORIDE 0.9 % IJ SOLN
INTRAMUSCULAR | Status: DC | PRN
  Administered 2017-03-08: 1.5 mL

## 2017-03-08 MED ORDER — PROPOFOL 10 MG/ML IV BOLUS
INTRAVENOUS | Status: AC
  Filled 2017-03-08: qty 20

## 2017-03-08 MED ORDER — SODIUM CHLORIDE 0.9 % IV SOLN: INTRAVENOUS | Status: DC

## 2017-03-08 MED ORDER — LACTATED RINGERS IV SOLN
INTRAVENOUS | Status: DC
  Administered 2017-03-08: 11:00:00 via INTRAVENOUS

## 2017-03-08 SURGICAL SUPPLY — 15 items

## 2017-03-08 NOTE — Anesthesia Preprocedure Evaluation (Addendum)
Anesthesia Evaluation  Patient identified by MRN, date of birth, ID band Patient awake    Reviewed: Allergy & Precautions, NPO status , Patient's Chart, lab work & pertinent test results  Airway Mallampati: I       Dental  (+) Upper Dentures, Lower Dentures   Pulmonary neg pulmonary ROS,    breath sounds clear to auscultation       Cardiovascular hypertension,  Rhythm:Regular Rate:Normal     Neuro/Psych negative neurological ROS  negative psych ROS   GI/Hepatic PUD,   Endo/Other  negative endocrine ROS  Renal/GU Renal disease  negative genitourinary   Musculoskeletal  (+) Arthritis , Osteoarthritis,    Abdominal   Peds negative pediatric ROS (+)  Hematology negative hematology ROS (+)   Anesthesia Other Findings   Reproductive/Obstetrics negative OB ROS                            Anesthesia Physical Anesthesia Plan  ASA: II  Anesthesia Plan: MAC   Post-op Pain Management:    Induction:   PONV Risk Score and Plan: 0 and Propofol  Airway Management Planned:   Additional Equipment:   Intra-op Plan:   Post-operative Plan:   Informed Consent: I have reviewed the patients History and Physical, chart, labs and discussed the procedure including the risks, benefits and alternatives for the proposed anesthesia with the patient or authorized representative who has indicated his/her understanding and acceptance.     Plan Discussed with:   Anesthesia Plan Comments:         Anesthesia Quick Evaluation

## 2017-03-08 NOTE — Transfer of Care (Signed)
Immediate Anesthesia Transfer of Care Note  Patient: Julia Robbins  Procedure(s) Performed: Procedure(s): ESOPHAGOGASTRODUODENOSCOPY (EGD) WITH PROPOFOL (N/A)  Patient Location: PACU  Anesthesia Type:MAC  Level of Consciousness:  sedated, patient cooperative and responds to stimulation  Airway & Oxygen Therapy:Patient Spontanous Breathing and Patient connected to face mask oxgen  Post-op Assessment:  Report given to PACU RN and Post -op Vital signs reviewed and stable  Post vital signs:  Reviewed and stable  Last Vitals:  Vitals:   03/08/17 1017  BP: (!) 144/76  Pulse: 84  Resp: 19  Temp: 68.0 C    Complications: No apparent anesthesia complications

## 2017-03-08 NOTE — Op Note (Signed)
[facility] Patient Name: Julia Robbins Procedure Date: 03/08/2017 MRN: [id number] Attending MD: Milus Banister , MD Date of Birth: [birth year] CSN: [id number] Age: 83 Admit Type: Outpatient Procedure:                Upper GI endoscopy Indications:              Abdominal pain in the left lower quadrant, Abnormal                            CT of the stomach (mass in stomach) Providers:                Milus Banister, MD, Elmer Ramp. Tilden Dome, RN, Tinnie Gens, Technician Referring MD:              Medicines:                Monitored Anesthesia Care Complications:            No immediate complications. Estimated blood loss:                            None. Estimated Blood Loss:     Estimated blood loss: none. Procedure:                Pre-Anesthesia Assessment:                           - Prior to the procedure, a History and Physical                            was performed, and patient medications and                            allergies were reviewed. The patient's tolerance of                            previous anesthesia was also reviewed. The risks                            and benefits of the procedure and the sedation                            options and risks were discussed with the patient.                            All questions were answered, and informed consent                            was obtained. Prior Anticoagulants: The patient has                            taken no previous anticoagulant or antiplatelet  agents. ASA Grade Assessment: III - A patient with                            severe systemic disease. After reviewing the risks                            and benefits, the patient was deemed in                            satisfactory condition to undergo the procedure.                           After obtaining informed consent, the endoscope was                            passed under direct  vision. Throughout the                            procedure, the patient's blood pressure, pulse, and                            oxygen saturations were monitored continuously. The                            Endoscope was introduced through the mouth, and                            advanced to the second part of duodenum. The upper                            GI endoscopy was accomplished without difficulty.                            The patient tolerated the procedure well. Scope In: Scope Out: Findings:      The esophagus was normal.      A small hiatal hernia was present.      A thickly pedunculated, apparently submucosal mass with edematous       overlying mucosa was found in the gastric cardia, 3cm distal to the GE       junction. The mass measures 2-3cm endoscopically. The mass was sampled       with tunnel biopsies. There was more than usual post biopsy oozing of       blood and so I injected 2cc of dilute epinephrine at the biopsy site       with good resulting hemostasis.      The examined duodenum was normal. Impression:               - Small hiatal hernia.                           - 2-3cm thickly pedunculated submucosal lesion in                            the proximal stomach. This was biopsied with tunnel  biopsies and the site was injected with dilute                            epinephrine. 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                           - This does NOT likely explain her RLQ pains. Moderate Sedation:      N/A- Per Anesthesia Care Recommendation:           - Patient has a contact number available for                            emergencies. The signs and symptoms of potential                            delayed complications were discussed with the                            patient. Return to normal activities tomorrow.                            Written discharge instructions were provided to the                             patient.                           - Resume previous diet.                           - Continue present medications.                           - Await pathology results. May need further testing                            for the RLQ pains (colonoscopy) if they continue. Procedure Code(s):        --- Professional ---                           [phone], Esophagogastroduodenoscopy, flexible,                            transoral; with biopsy, single or multiple Diagnosis Code(s):        --- Professional ---                           K44.9, Diaphragmatic hernia without obstruction or                            gangrene                           D49.0, Neoplasm of unspecified behavior of  digestive system                           R10.32, Left lower quadrant pain                           R93.3, Abnormal findings on diagnostic imaging of                            other parts of digestive tract CPT copyright 2016 [employer]. All rights reserved. The codes documented in this report are preliminary and upon coder review may  be revised to meet current compliance requirements. Milus Banister, MD 03/08/2017 12:16:21 PM This report has been signed electronically. Number of Addenda: 0

## 2017-03-08 NOTE — H&P (View-Only) (Signed)
Subjective:    Patient ID: Julia Robbins, female    DOB: July 08, 1923, 83 y.o.   MRN: [id number]  HPI Julia Robbins is a pleasant 83 year old African-American female, known remotely to Dr. Ardis Hughs and referred today by Dr. Ashby Dawes for complaints of right lower quadrant pain and an abnormal CT of the abdomen and pelvis. Patient has history of colon cancer diagnosed in 1998, she is status post resection, and had follow-up colonoscopies last done in 2003 and again in 2005 per Dr.Orr. These were both negative with the exception of diverticulosis. She was seen by Dr. Ardis Hughs in 2011 and at that time decided not to pursue further screening colonoscopy. Patient says she has been having some intermittent which she describes as right lower quadrant discomfort over the past 7 or 8 months. This is not constant but rather comes and goes and is described as sharp . Pain does not seem to be associated with by mouth intake or bowel movements. Her appetite is been fine her weight has been stable. She has been having regular bowel movements without any melena or hematochezia. Most recent labs done on 01/31/2017 per Dr. Hilton [place] office showed hemoglobin 13.6 hematocrit of 41.6 WBC 5.7 platelets 212 BUN 22, creatinine 1.24 LFTs within normal limits.   CT of the abdomen and pelvis was done on 02/14/2017 which shows multiple gallstones and dilation of the intra-and extrahepatic ducts all of which is chronic since 2010. Also noted a soft tissue mass arising from the medial gastric fundus just distal to the GE junction and measuring 4 x 3.2 x 3.3 cm, she also has a duodenal diverticulum and multiple diverticuli in the remaining colon without evidence of diverticulitis.    Review of Systems Pertinent positive and negative review of systems were noted in the above HPI section.  All other review of systems was otherwise negative.  Outpatient Encounter Prescriptions as of 02/28/2017  Medication Sig  . acetaminophen  (TYLENOL) 500 MG tablet Take 1,000 mg by mouth 2 (two) times daily as needed for moderate pain or headache.  . Ascorbic Acid (VITAMIN C) 1000 MG tablet Take 1,000 mg by mouth daily.  . Cholecalciferol (VITAMIN D-3 PO) Take 1 tablet by mouth daily.  . cyanocobalamin 500 MCG tablet Take 1 tablet (500 mcg total) by mouth daily.  . cyclobenzaprine (FLEXERIL) 10 MG tablet Take 10 mg by mouth 2 (two) times daily as needed for muscle spasms.   Marland Kitchen dextromethorphan (DELSYM) 30 MG/5ML liquid Take 60 mg by mouth as needed for cough.  Marland Kitchen DM-Phenylephrine-Acetaminophen (ALKA-SELTZER PLUS DAY COLD/FLU PO) Take 2 tablets by mouth daily as needed (cold symptoms).  . ferrous sulfate 325 (65 FE) MG tablet Take 325 mg by mouth daily with breakfast.   . furosemide (LASIX) 40 MG tablet Take 80 mg by mouth daily.   . hydrOXYzine (ATARAX/VISTARIL) 25 MG tablet Take 25 mg by mouth at bedtime as needed for itching.   . Multiple Vitamin (MULTIVITAMIN) capsule Take 1 capsule by mouth daily.  . nortriptyline (PAMELOR) 10 MG capsule Take 10 mg by mouth 2 (two) times daily.  Marland Kitchen oxyCODONE-acetaminophen (PERCOCET) 5-325 MG per tablet Take 2 tablets by mouth every 4 (four) hours as needed for pain.  . potassium chloride SA (K-DUR,KLOR-CON) 20 MEQ tablet Take 20 mEq by mouth 2 (two) times daily.   . pravastatin (PRAVACHOL) 40 MG tablet Take 40 mg by mouth daily.  . traMADol (ULTRAM) 50 MG tablet Take 50 mg by mouth every 6 (six) hours as  needed for moderate pain.  . [DISCONTINUED] Na Sulfate-K Sulfate-Mg Sulf 17.5-3.13-1.6 GM/180ML SOLN Take 1 kit by mouth once.   No facility-administered encounter medications on file as of 02/28/2017.    No Known Allergies Patient Active Problem List   Diagnosis Date Noted  . Acute blood loss anemia 12/02/2015  . CKD (chronic kidney disease) stage 3, GFR 30-59 ml/min 12/02/2015  . Lower GI bleed   . GI bleed 12/01/2015  . Acute kidney injury ([place]) 12/01/2015  . Elevated lactic acid level  12/01/2015  . Essential hypertension 12/01/2015  . History of DVT (deep vein thrombosis) 10/31/2012  . Hyperlipidemia 10/31/2012  . History of colon cancer 03/29/2010   Social History   Social History  . Marital status: Widowed    Spouse name: N/A  . Number of children: N/A  . Years of education: N/A   Occupational History  . Not on file.   Social History Main Topics  . Smoking status: Never Smoker  . Smokeless tobacco: Never Used  . Alcohol use No  . Drug use: No  . Sexual activity: No   Other Topics Concern  . Not on file   Social History Narrative  . No narrative on file    Julia Robbins's family history includes Heart disease in her mother.      Objective:    Vitals:   02/28/17 1028  BP: 140/74  Pulse: 96    Physical Exam  well-developed elderly African-American female in no acute distress, she is accompanied by her daughter, ambulates with a walker. Blood pressure 140/74 pulse 96, height 5 foot 5, weight 170, BMI 28.4. HEENT ;nontraumatic normocephalic EOMI PERRLA sclera anicteric, Cardiovascular ;regular rate and rhythm with S1-S2, Pulmonary ;clear bilaterally, she is kyphotic Abdomen; soft, bowel sounds are present she has a midline incisional scar there is no upper abdominal tenderness. She has some mild discomfort along the right iliac crest, no true right lower quadrant tenderness, no palpable mass or hepatosplenomegaly  Rectal ;exam not done, Extremities; no clubbing, cyanosis or edema skin warm and dry, Neuropsych ;mood and affect appropriate       Assessment & Plan:   #24 83 year old female with complaints of intermittent sharp right lower quadrant pain 7 date months. CT scan is unrevealing as to cause. On exam this seems more consistent with a musculoskeletal type pain with tenderness to palpation over the iliac on the right. #2 abnormal CT scan showing a soft tissue mass in the gastric fundus-patient has no referral bowel symptoms. Will need to rule out  benign versus malignant lesion question leiomyoma #3 colonic diverticulosis #4 history of colon cancer diagnosed 1998 status post resection. Last colonoscopy 2005 negative #5 hypertension #6 chronic kidney disease #7 osteoarthritis  Plan; Discussed CT findings in detail with the patient and her daughter. Will schedule for upper endoscopy with Dr. Ardis Hughs with biopsy. Procedure discussed in detail with patient including risks and benefits and she is agreeable to proceed We'll check right hip films We discussed option of colonoscopy to further investigate the right lower quadrant pain. Again CT unrevealing. She would like to proceed with EGD initially.  Celise Bazar S Lauralye Kinn PA-C 02/28/2017   Cc: Merrilee Seashore, MD

## 2017-03-08 NOTE — Discharge Instructions (Signed)

## 2017-03-08 NOTE — Interval H&P Note (Signed)
History and Physical Interval Note:  03/08/2017 11:00 AM  Julia Robbins  has presented today for surgery, with the diagnosis of gastric mass on CT  The various methods of treatment have been discussed with the patient and family. After consideration of risks, benefits and other options for treatment, the patient has consented to  Procedure(s): ESOPHAGOGASTRODUODENOSCOPY (EGD) WITH PROPOFOL (N/A) as a surgical intervention .  The patient's history has been reviewed, patient examined, no change in status, stable for surgery.  I have reviewed the patient's chart and labs.  Questions were answered to the patient's satisfaction.     Milus Banister

## 2017-03-09 ENCOUNTER — Encounter (HOSPITAL_COMMUNITY): Payer: Self-pay | Admitting: Gastroenterology

## 2017-03-09 NOTE — Anesthesia Postprocedure Evaluation (Signed)
Anesthesia Post Note  Patient: Julia Robbins  Procedure(s) Performed: Procedure(s) (LRB): ESOPHAGOGASTRODUODENOSCOPY (EGD) WITH PROPOFOL (N/A)     Patient location during evaluation: PACU Anesthesia Type: MAC Level of consciousness: awake and alert Pain management: pain level controlled Vital Signs Assessment: post-procedure vital signs reviewed and stable Respiratory status: spontaneous breathing, nonlabored ventilation, respiratory function stable and patient connected to nasal cannula oxygen Cardiovascular status: stable and blood pressure returned to baseline Anesthetic complications: no    Last Vitals:  Vitals:   03/08/17 1240 03/08/17 1245  BP: (!) 163/87   Pulse: 77 83  Resp: (!) 23 (!) 22  Temp:      Last Pain:  Vitals:   03/08/17 1017  TempSrc: Oral                 Effie Berkshire

## 2017-03-14 ENCOUNTER — Other Ambulatory Visit: Payer: Self-pay

## 2017-03-14 DIAGNOSIS — R1031 Right lower quadrant pain: Secondary | ICD-10-CM

## 2017-03-14 DIAGNOSIS — C189 Malignant neoplasm of colon, unspecified: Secondary | ICD-10-CM

## 2017-03-14 MED ORDER — NA SULFATE-K SULFATE-MG SULF 17.5-3.13-1.6 GM/177ML PO SOLN: 1.0000 | Freq: Once | ORAL | 0 refills | Status: AC

## 2017-03-16 ENCOUNTER — Other Ambulatory Visit: Payer: Self-pay

## 2017-03-16 MED ORDER — NA SULFATE-K SULFATE-MG SULF 17.5-3.13-1.6 GM/177ML PO SOLN: 1.0000 | Freq: Once | ORAL | 0 refills | Status: AC

## 2017-03-16 NOTE — Telephone Encounter (Signed)
Colon scheduled, pt instructed and medications reviewed.  Patient instructions mailed to home.  Patient to call with any questions or concerns.  

## 2017-04-10 ENCOUNTER — Encounter (HOSPITAL_COMMUNITY): Payer: Self-pay | Admitting: *Deleted

## 2017-04-12 ENCOUNTER — Ambulatory Visit (HOSPITAL_COMMUNITY): Payer: Medicare Other | Admitting: Anesthesiology

## 2017-04-12 ENCOUNTER — Ambulatory Visit (HOSPITAL_COMMUNITY)
Admission: RE | Admit: 2017-04-12 | Discharge: 2017-04-12 | Disposition: A | Discharge status: Home / Self Care | Payer: Medicare Other | Source: Ambulatory Visit | Attending: Gastroenterology | Admitting: Gastroenterology

## 2017-04-12 ENCOUNTER — Encounter (HOSPITAL_COMMUNITY): Payer: Self-pay | Admitting: *Deleted

## 2017-04-12 ENCOUNTER — Encounter (HOSPITAL_COMMUNITY)
Admission: RE | Disposition: A | Discharge status: Home / Self Care | Payer: Self-pay | Source: Ambulatory Visit | Attending: Gastroenterology

## 2017-04-12 DIAGNOSIS — Z85038 Personal history of other malignant neoplasm of large intestine: Secondary | ICD-10-CM | POA: Insufficient documentation

## 2017-04-12 DIAGNOSIS — Z9049 Acquired absence of other specified parts of digestive tract: Secondary | ICD-10-CM | POA: Insufficient documentation

## 2017-04-12 DIAGNOSIS — Z9221 Personal history of antineoplastic chemotherapy: Secondary | ICD-10-CM | POA: Insufficient documentation

## 2017-04-12 DIAGNOSIS — R1031 Right lower quadrant pain: Secondary | ICD-10-CM | POA: Diagnosis not present

## 2017-04-12 DIAGNOSIS — I10 Essential (primary) hypertension: Secondary | ICD-10-CM | POA: Insufficient documentation

## 2017-04-12 DIAGNOSIS — K5730 Diverticulosis of large intestine without perforation or abscess without bleeding: Secondary | ICD-10-CM | POA: Diagnosis not present

## 2017-04-12 DIAGNOSIS — Z980 Intestinal bypass and anastomosis status: Secondary | ICD-10-CM | POA: Insufficient documentation

## 2017-04-12 DIAGNOSIS — C189 Malignant neoplasm of colon, unspecified: Secondary | ICD-10-CM

## 2017-04-12 DIAGNOSIS — Z86718 Personal history of other venous thrombosis and embolism: Secondary | ICD-10-CM | POA: Diagnosis not present

## 2017-04-12 HISTORY — DX: Pure hypercholesterolemia, unspecified: E78.00

## 2017-04-12 HISTORY — DX: Family history of other specified conditions: Z84.89

## 2017-04-12 HISTORY — PX: COLONOSCOPY WITH PROPOFOL: SHX5780

## 2017-04-12 SURGERY — COLONOSCOPY WITH PROPOFOL
Anesthesia: Monitor Anesthesia Care

## 2017-04-12 MED ORDER — LIDOCAINE 2% (20 MG/ML) 5 ML SYRINGE
INTRAMUSCULAR | Status: AC
  Filled 2017-04-12: qty 5

## 2017-04-12 MED ORDER — PROPOFOL 10 MG/ML IV BOLUS
INTRAVENOUS | Status: DC | PRN
  Administered 2017-04-12: 10 mg via INTRAVENOUS
  Administered 2017-04-12: 30 mg via INTRAVENOUS
  Administered 2017-04-12: 10 mg via INTRAVENOUS

## 2017-04-12 MED ORDER — PROPOFOL 10 MG/ML IV BOLUS
INTRAVENOUS | Status: AC
  Filled 2017-04-12: qty 40

## 2017-04-12 MED ORDER — HYOSCYAMINE SULFATE 0.125 MG SL SUBL: 0.1250 mg | SUBLINGUAL_TABLET | SUBLINGUAL | 3 refills | Status: DC | PRN

## 2017-04-12 MED ORDER — LACTATED RINGERS IV SOLN
INTRAVENOUS | Status: DC
  Administered 2017-04-12: 11:00:00 via INTRAVENOUS

## 2017-04-12 MED ORDER — PROPOFOL 500 MG/50ML IV EMUL
INTRAVENOUS | Status: DC | PRN
  Administered 2017-04-12: 100 ug/kg/min via INTRAVENOUS

## 2017-04-12 MED ORDER — SODIUM CHLORIDE 0.9 % IV SOLN: INTRAVENOUS | Status: DC

## 2017-04-12 MED ORDER — LIDOCAINE 2% (20 MG/ML) 5 ML SYRINGE
INTRAMUSCULAR | Status: DC | PRN
  Administered 2017-04-12: 40 mg via INTRAVENOUS

## 2017-04-12 SURGICAL SUPPLY — 22 items

## 2017-04-12 NOTE — Anesthesia Preprocedure Evaluation (Signed)
Anesthesia Evaluation  Patient identified by MRN, date of birth, ID band Patient awake    Reviewed: Allergy & Precautions, NPO status , Patient's Chart, lab work & pertinent test results  History of Anesthesia Complications (+) Family history of anesthesia reaction  Airway Mallampati: I  TM Distance: >3 FB Neck ROM: Full    Dental  (+) Lower Dentures, Upper Dentures   Pulmonary neg pulmonary ROS,    Pulmonary exam normal breath sounds clear to auscultation       Cardiovascular hypertension, Pt. on medications  Rhythm:Regular Rate:Normal  Hx/o LBBB S/P IVC filter for DVT hx   Neuro/Psych Bilateral hearing deficits negative psych ROS   GI/Hepatic PUD, RLQ pain Hx/o Colon Ca   Endo/Other  Hypercholesterolemia  Renal/GU Renal InsufficiencyRenal disease  negative genitourinary   Musculoskeletal  (+) Arthritis , Osteoarthritis,    Abdominal   Peds  Hematology  (+) anemia ,   Anesthesia Other Findings   Reproductive/Obstetrics                             Anesthesia Physical Anesthesia Plan  ASA: II  Anesthesia Plan: MAC   Post-op Pain Management:    Induction: Intravenous  PONV Risk Score and Plan: 2 and Ondansetron and Propofol  Airway Management Planned: Natural Airway, Nasal Cannula and Simple Face Mask  Additional Equipment:   Intra-op Plan:   Post-operative Plan:   Informed Consent: I have reviewed the patients History and Physical, chart, labs and discussed the procedure including the risks, benefits and alternatives for the proposed anesthesia with the patient or authorized representative who has indicated his/her understanding and acceptance.   Dental advisory given  Plan Discussed with: CRNA, Surgeon and Anesthesiologist  Anesthesia Plan Comments:         Anesthesia Quick Evaluation

## 2017-04-12 NOTE — Op Note (Signed)
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 2/15 lymph nodes                            were positive. The patient underwent colectomy and                            adjuvant chemotherapy with 5-FU and leucovorin.                            Chemotherapy treatments were completed in February                            1999. At least two colonoscopies by Dr. Jim Desanctis,                            the most recent was in 2005; no recurrence, no                            polyps Providers:                Milus Banister, MD, Cleda Daub, RN, Cherylynn Ridges, Technician, Dione Booze, CRNA Referring MD:              Medicines:                Monitored Anesthesia Care Complications:            No immediate complications. Estimated blood loss:                            None. Estimated Blood Loss:     Estimated blood loss: none. Procedure:                Pre-Anesthesia Assessment:                           - Prior to the procedure, a History and Physical                            was performed, and patient medications and  allergies were reviewed. The patient's tolerance of                            previous anesthesia was also reviewed. The risks                            and benefits of the procedure and the sedation                            options and risks were discussed with the patient.                            All questions were answered, and informed consent                            was obtained. Prior Anticoagulants:  The patient has                            taken no previous anticoagulant or antiplatelet                            agents. ASA Grade Assessment: III - A patient with                            severe systemic disease. After reviewing the risks                            and benefits, the patient was deemed in                            satisfactory condition to undergo the procedure.                           After obtaining informed consent, the colonoscope                            was passed under direct vision. Throughout the                            procedure, the patient's blood pressure, pulse, and                            oxygen saturations were monitored continuously. The                            EC-[id number] ([id number]) scope was introduced through                            the anus and advanced to the the ileocolonic                            anastomosis. The colonoscopy was performed without  difficulty. The patient tolerated the procedure                            well. The quality of the bowel preparation was                            good. The rectum was photographed. Scope In: 12:17:47 PM Scope Out: 12:23:20 PM Total Procedure Duration: 0 hours 5 minutes 33 seconds  Findings:      A few diverticula were found in the descending colon, just distal to the       anastomosis.      Ileocolonic anastomosis was normal appearing. Anatomy consistent with       extended right colectomy (total length of remaining colon is about 40cm)      The exam was otherwise without abnormality on direct and retroflexion       views. Impression:               - Normal ileocolonic anastomosis, s/p what appears                            to have been an extended right colectomy for colon                            cancer 1998 (op report is not available).                           - Minor diverticular changes distal to the                            anastomosis.                            - These findings do not explain her abdominal                            pains. Possibly the pains are adhesive related. Moderate Sedation:      N/A- Per Anesthesia Care Recommendation:           - Patient has a contact number available for                            emergencies. The signs and symptoms of potential                            delayed complications were discussed with the                            patient. Return to normal activities tomorrow.                            Written discharge instructions were provided to the                            patient.                           -  Resume previous diet.                           - Continue present medications. Also trial of                            sublingual antispasmodic medicines. Patient or her                            daughter will call in 3-4 weeks to report on her                            response.                           - No repeat colonoscopy. Procedure Code(s):        --- Professional ---                           [phone], Colonoscopy, flexible; diagnostic, including                            collection of specimen(s) by brushing or washing,                            when performed (separate procedure) Diagnosis Code(s):        --- Professional ---                           Z98.0, Intestinal bypass and anastomosis status                           R10.31, Right lower quadrant pain                           K57.30, Diverticulosis of large intestine without                            perforation or abscess without bleeding CPT copyright 2016 [employer]. All rights reserved. The codes documented in this report are preliminary and upon coder review may  be revised to meet current compliance requirements. Milus Banister, MD 04/12/2017 12:34:04 PM This report has been signed electronically. Number of Addenda: 0

## 2017-04-12 NOTE — Anesthesia Postprocedure Evaluation (Signed)
Anesthesia Post Note  Patient: ZAIDEE RION  Procedure(s) Performed: Procedure(s) (LRB): COLONOSCOPY WITH PROPOFOL (N/A)     Patient location during evaluation: PACU Anesthesia Type: MAC Level of consciousness: awake and alert and oriented Pain management: pain level controlled Vital Signs Assessment: post-procedure vital signs reviewed and stable Respiratory status: spontaneous breathing, nonlabored ventilation and respiratory function stable Cardiovascular status: stable and blood pressure returned to baseline Postop Assessment: no signs of nausea or vomiting Anesthetic complications: no    Last Vitals:  Vitals:   04/12/17 1250 04/12/17 1300  BP: 121/66 124/70  Pulse: 84 69  Resp: (!) 22 15  Temp:      Last Pain:  Vitals:   04/12/17 1033  TempSrc: Oral                 Abbagayle Zaragoza A.

## 2017-04-12 NOTE — Anesthesia Procedure Notes (Signed)
Procedure Name: MAC Date/Time: 04/12/2017 12:10 PM Performed by: Dione Booze Pre-anesthesia Checklist: Patient identified, Emergency Drugs available, Suction available and Patient being monitored Patient Re-evaluated:Patient Re-evaluated prior to induction Oxygen Delivery Method: Simple face mask Placement Confirmation: positive ETCO2

## 2017-04-12 NOTE — Discharge Instructions (Signed)

## 2017-04-12 NOTE — Transfer of Care (Signed)
Immediate Anesthesia Transfer of Care Note  Patient: Julia Robbins  Procedure(s) Performed: Procedure(s): COLONOSCOPY WITH PROPOFOL (N/A)  Patient Location: PACU and Endoscopy Unit  Anesthesia Type:MAC  Level of Consciousness: sedated and patient cooperative  Airway & Oxygen Therapy: Patient Spontanous Breathing and Patient connected to nasal cannula oxygen  Post-op Assessment: Report given to RN and Post -op Vital signs reviewed and stable  Post vital signs: Reviewed and stable  Last Vitals:  Vitals:   04/12/17 1033  BP: 114/83  Pulse: (!) 103  Resp: (!) 23  Temp: 36.9 C    Last Pain:  Vitals:   04/12/17 1033  TempSrc: Oral         Complications: No apparent anesthesia complications

## 2017-04-12 NOTE — H&P (Signed)
HPI: This is a 83 yo woman  Chief complaint is history of colon cancer, now with RLQ pain  ROS: complete GI ROS as described in HPI, all other review negative.  Constitutional:  No unintentional weight loss   Past Medical History:  Diagnosis Date  . Abnormal EKG    hx left bundle branch block dating back to 2012 on old ekg's  . Arthritis    degenerative.   . Cholelithiasis 2010   15 mm CBD on CT scan 2010.   Marland Kitchen Colon cancer ([place]) 1998   chemo and colectomy.  T3 N1 stage 3, 2/15 nodes +.   . DVT (deep venous thrombosis) (H[facility])    right leg  . Elevated cholesterol   . Family history of adverse reaction to anesthesia    daughter has post op n/v  . Gastric ulcer 1998  . Greenfield filter in place 1998   for DVT. right leg  . HOH (hard of hearing)    slight    Past Surgical History:  Procedure Laterality Date  . COLON SURGERY  1998  . DILATION AND CURETTAGE OF UTERUS  yrs ago  . ESOPHAGOGASTRODUODENOSCOPY (EGD) WITH PROPOFOL N/A 03/08/2017   Procedure: ESOPHAGOGASTRODUODENOSCOPY (EGD) WITH PROPOFOL;  Surgeon: Milus Banister, MD;  Location: [facility];  Service: Endoscopy;  Laterality: N/A;    Current Facility-Administered Medications  Medication Dose Route Frequency Provider Last Rate Last Dose  . lactated ringers infusion   Intravenous Continuous Milus Banister, MD        Allergies as of 03/14/2017  . (No Known Allergies)    Family History  Problem Relation Age of Onset  . Heart disease Mother   . Colon cancer Neg Hx   . Stomach cancer Neg Hx   . Esophageal cancer Neg Hx   . Pancreatic cancer Neg Hx   . Liver disease Neg Hx     Social History   Social History  . Marital status: Widowed    Spouse name: N/A  . Number of children: N/A  . Years of education: N/A   Occupational History  . Not on file.   Social History Main Topics  . Smoking status: Never Smoker  . Smokeless tobacco: Never Used  . Alcohol use No  . Drug use: No  . Sexual activity: No    Other Topics Concern  . Not on file   Social History Narrative  . No narrative on file     Physical Exam: BP 114/83   Pulse (!) 103   Temp 98.4 F (36.9 C) (Oral)   Resp (!) 23   Ht 5\' 5"  (1.651 m)   Wt 170 lb (77.1 kg)   SpO2 95%   BMI 28.29 kg/m  Constitutional: generally well-appearing Psychiatric: alert and oriented x3 Abdomen: soft, nontender, nondistended, no obvious ascites, no peritoneal signs, normal bowel sounds No peripheral edema noted in lower extremities  Assessment and plan: 83 y.o. female with RLQ pain, h/o colon cancer   For colonoscopy today  Please see the "Patient Instructions" section for addition details about the plan.  Owens Loffler, MD [facility] 04/12/2017, 10:35 AM

## 2017-04-13 ENCOUNTER — Encounter (HOSPITAL_COMMUNITY): Payer: Self-pay | Admitting: Gastroenterology

## 2017-04-17 ENCOUNTER — Encounter: Payer: Medicare Other | Admitting: Gastroenterology

## 2017-10-03 DIAGNOSIS — I129 Hypertensive chronic kidney disease with stage 1 through stage 4 chronic kidney disease, or unspecified chronic kidney disease: Secondary | ICD-10-CM | POA: Diagnosis not present

## 2017-10-10 DIAGNOSIS — I129 Hypertensive chronic kidney disease with stage 1 through stage 4 chronic kidney disease, or unspecified chronic kidney disease: Secondary | ICD-10-CM | POA: Diagnosis not present

## 2017-10-10 DIAGNOSIS — E782 Mixed hyperlipidemia: Secondary | ICD-10-CM | POA: Diagnosis not present

## 2018-03-13 DIAGNOSIS — I129 Hypertensive chronic kidney disease with stage 1 through stage 4 chronic kidney disease, or unspecified chronic kidney disease: Secondary | ICD-10-CM | POA: Diagnosis not present

## 2018-03-13 DIAGNOSIS — Z0000 Encounter for general adult medical examination without abnormal findings: Secondary | ICD-10-CM | POA: Diagnosis not present

## 2018-03-13 DIAGNOSIS — E782 Mixed hyperlipidemia: Secondary | ICD-10-CM | POA: Diagnosis not present

## 2018-03-13 DIAGNOSIS — N390 Urinary tract infection, site not specified: Secondary | ICD-10-CM | POA: Diagnosis not present

## 2018-03-20 DIAGNOSIS — I129 Hypertensive chronic kidney disease with stage 1 through stage 4 chronic kidney disease, or unspecified chronic kidney disease: Secondary | ICD-10-CM | POA: Diagnosis not present

## 2018-03-20 DIAGNOSIS — E782 Mixed hyperlipidemia: Secondary | ICD-10-CM | POA: Diagnosis not present

## 2018-03-20 DIAGNOSIS — M310 Hypersensitivity angiitis: Secondary | ICD-10-CM | POA: Diagnosis not present

## 2018-03-27 ENCOUNTER — Ambulatory Visit: Payer: Medicare Other | Admitting: Podiatry

## 2018-07-26 DIAGNOSIS — Z23 Encounter for immunization: Secondary | ICD-10-CM | POA: Diagnosis not present

## 2018-10-21 ENCOUNTER — Other Ambulatory Visit (HOSPITAL_BASED_OUTPATIENT_CLINIC_OR_DEPARTMENT_OTHER): Payer: Self-pay | Admitting: Internal Medicine

## 2018-10-21 ENCOUNTER — Encounter (HOSPITAL_BASED_OUTPATIENT_CLINIC_OR_DEPARTMENT_OTHER): Payer: Medicare Other | Attending: Internal Medicine

## 2018-10-21 DIAGNOSIS — S91301A Unspecified open wound, right foot, initial encounter: Secondary | ICD-10-CM | POA: Diagnosis not present

## 2018-10-21 DIAGNOSIS — Z9221 Personal history of antineoplastic chemotherapy: Secondary | ICD-10-CM | POA: Insufficient documentation

## 2018-10-21 DIAGNOSIS — Z85038 Personal history of other malignant neoplasm of large intestine: Secondary | ICD-10-CM | POA: Insufficient documentation

## 2018-10-21 DIAGNOSIS — I10 Essential (primary) hypertension: Secondary | ICD-10-CM | POA: Diagnosis not present

## 2018-10-21 DIAGNOSIS — L859 Epidermal thickening, unspecified: Secondary | ICD-10-CM | POA: Diagnosis not present

## 2018-10-21 DIAGNOSIS — I87323 Chronic venous hypertension (idiopathic) with inflammation of bilateral lower extremity: Secondary | ICD-10-CM | POA: Insufficient documentation

## 2018-10-21 DIAGNOSIS — Z923 Personal history of irradiation: Secondary | ICD-10-CM | POA: Diagnosis not present

## 2018-10-21 DIAGNOSIS — L97312 Non-pressure chronic ulcer of right ankle with fat layer exposed: Secondary | ICD-10-CM | POA: Insufficient documentation

## 2018-10-21 DIAGNOSIS — Z86718 Personal history of other venous thrombosis and embolism: Secondary | ICD-10-CM | POA: Diagnosis not present

## 2018-10-22 DIAGNOSIS — S91001A Unspecified open wound, right ankle, initial encounter: Secondary | ICD-10-CM | POA: Diagnosis not present

## 2018-10-28 DIAGNOSIS — Z85038 Personal history of other malignant neoplasm of large intestine: Secondary | ICD-10-CM | POA: Diagnosis not present

## 2018-10-28 DIAGNOSIS — Z923 Personal history of irradiation: Secondary | ICD-10-CM | POA: Diagnosis not present

## 2018-10-28 DIAGNOSIS — Z86718 Personal history of other venous thrombosis and embolism: Secondary | ICD-10-CM | POA: Diagnosis not present

## 2018-10-28 DIAGNOSIS — Z9221 Personal history of antineoplastic chemotherapy: Secondary | ICD-10-CM | POA: Diagnosis not present

## 2018-10-28 DIAGNOSIS — I10 Essential (primary) hypertension: Secondary | ICD-10-CM | POA: Diagnosis not present

## 2018-10-28 DIAGNOSIS — L97312 Non-pressure chronic ulcer of right ankle with fat layer exposed: Secondary | ICD-10-CM | POA: Diagnosis not present

## 2018-10-28 DIAGNOSIS — S91301A Unspecified open wound, right foot, initial encounter: Secondary | ICD-10-CM | POA: Diagnosis not present

## 2018-10-28 DIAGNOSIS — I87323 Chronic venous hypertension (idiopathic) with inflammation of bilateral lower extremity: Secondary | ICD-10-CM | POA: Diagnosis not present

## 2018-10-29 DIAGNOSIS — I129 Hypertensive chronic kidney disease with stage 1 through stage 4 chronic kidney disease, or unspecified chronic kidney disease: Secondary | ICD-10-CM | POA: Diagnosis not present

## 2018-10-29 DIAGNOSIS — E782 Mixed hyperlipidemia: Secondary | ICD-10-CM | POA: Diagnosis not present

## 2018-10-30 DIAGNOSIS — N183 Chronic kidney disease, stage 3 (moderate): Secondary | ICD-10-CM | POA: Diagnosis not present

## 2018-10-30 DIAGNOSIS — E782 Mixed hyperlipidemia: Secondary | ICD-10-CM | POA: Diagnosis not present

## 2018-10-30 DIAGNOSIS — I129 Hypertensive chronic kidney disease with stage 1 through stage 4 chronic kidney disease, or unspecified chronic kidney disease: Secondary | ICD-10-CM | POA: Diagnosis not present

## 2018-11-07 ENCOUNTER — Encounter (HOSPITAL_BASED_OUTPATIENT_CLINIC_OR_DEPARTMENT_OTHER): Payer: Medicare Other | Attending: Internal Medicine

## 2018-11-07 DIAGNOSIS — Z86718 Personal history of other venous thrombosis and embolism: Secondary | ICD-10-CM | POA: Insufficient documentation

## 2018-11-07 DIAGNOSIS — I10 Essential (primary) hypertension: Secondary | ICD-10-CM | POA: Insufficient documentation

## 2018-11-07 DIAGNOSIS — L97312 Non-pressure chronic ulcer of right ankle with fat layer exposed: Secondary | ICD-10-CM | POA: Insufficient documentation

## 2018-11-07 DIAGNOSIS — Z923 Personal history of irradiation: Secondary | ICD-10-CM | POA: Insufficient documentation

## 2018-11-07 DIAGNOSIS — Z9221 Personal history of antineoplastic chemotherapy: Secondary | ICD-10-CM | POA: Insufficient documentation

## 2018-11-07 DIAGNOSIS — I87323 Chronic venous hypertension (idiopathic) with inflammation of bilateral lower extremity: Secondary | ICD-10-CM | POA: Insufficient documentation

## 2018-11-11 DIAGNOSIS — L97312 Non-pressure chronic ulcer of right ankle with fat layer exposed: Secondary | ICD-10-CM | POA: Diagnosis not present

## 2018-11-11 DIAGNOSIS — S91301A Unspecified open wound, right foot, initial encounter: Secondary | ICD-10-CM | POA: Diagnosis not present

## 2018-11-11 DIAGNOSIS — I87323 Chronic venous hypertension (idiopathic) with inflammation of bilateral lower extremity: Secondary | ICD-10-CM | POA: Diagnosis not present

## 2018-11-11 DIAGNOSIS — Z9221 Personal history of antineoplastic chemotherapy: Secondary | ICD-10-CM | POA: Diagnosis not present

## 2018-11-11 DIAGNOSIS — S91001A Unspecified open wound, right ankle, initial encounter: Secondary | ICD-10-CM | POA: Diagnosis not present

## 2018-11-11 DIAGNOSIS — Z923 Personal history of irradiation: Secondary | ICD-10-CM | POA: Diagnosis not present

## 2018-11-11 DIAGNOSIS — Z86718 Personal history of other venous thrombosis and embolism: Secondary | ICD-10-CM | POA: Diagnosis not present

## 2018-11-11 DIAGNOSIS — I10 Essential (primary) hypertension: Secondary | ICD-10-CM | POA: Diagnosis not present

## 2018-11-12 DIAGNOSIS — S91001A Unspecified open wound, right ankle, initial encounter: Secondary | ICD-10-CM | POA: Diagnosis not present

## 2018-11-19 DIAGNOSIS — L97312 Non-pressure chronic ulcer of right ankle with fat layer exposed: Secondary | ICD-10-CM | POA: Diagnosis not present

## 2018-11-19 DIAGNOSIS — Z9221 Personal history of antineoplastic chemotherapy: Secondary | ICD-10-CM | POA: Diagnosis not present

## 2018-11-19 DIAGNOSIS — I87323 Chronic venous hypertension (idiopathic) with inflammation of bilateral lower extremity: Secondary | ICD-10-CM | POA: Diagnosis not present

## 2018-11-19 DIAGNOSIS — Z923 Personal history of irradiation: Secondary | ICD-10-CM | POA: Diagnosis not present

## 2018-11-19 DIAGNOSIS — Z86718 Personal history of other venous thrombosis and embolism: Secondary | ICD-10-CM | POA: Diagnosis not present

## 2018-11-19 DIAGNOSIS — I10 Essential (primary) hypertension: Secondary | ICD-10-CM | POA: Diagnosis not present

## 2018-11-19 DIAGNOSIS — S91301A Unspecified open wound, right foot, initial encounter: Secondary | ICD-10-CM | POA: Diagnosis not present

## 2018-11-26 DIAGNOSIS — L97312 Non-pressure chronic ulcer of right ankle with fat layer exposed: Secondary | ICD-10-CM | POA: Diagnosis not present

## 2018-11-26 DIAGNOSIS — Z86718 Personal history of other venous thrombosis and embolism: Secondary | ICD-10-CM | POA: Diagnosis not present

## 2018-11-26 DIAGNOSIS — Z9221 Personal history of antineoplastic chemotherapy: Secondary | ICD-10-CM | POA: Diagnosis not present

## 2018-11-26 DIAGNOSIS — I87323 Chronic venous hypertension (idiopathic) with inflammation of bilateral lower extremity: Secondary | ICD-10-CM | POA: Diagnosis not present

## 2018-11-26 DIAGNOSIS — I10 Essential (primary) hypertension: Secondary | ICD-10-CM | POA: Diagnosis not present

## 2018-11-26 DIAGNOSIS — Z923 Personal history of irradiation: Secondary | ICD-10-CM | POA: Diagnosis not present

## 2018-11-26 DIAGNOSIS — S91301A Unspecified open wound, right foot, initial encounter: Secondary | ICD-10-CM | POA: Diagnosis not present

## 2018-12-03 ENCOUNTER — Encounter (HOSPITAL_BASED_OUTPATIENT_CLINIC_OR_DEPARTMENT_OTHER): Payer: Medicare Other | Attending: Internal Medicine

## 2018-12-03 DIAGNOSIS — Z9221 Personal history of antineoplastic chemotherapy: Secondary | ICD-10-CM | POA: Insufficient documentation

## 2018-12-03 DIAGNOSIS — Z923 Personal history of irradiation: Secondary | ICD-10-CM | POA: Insufficient documentation

## 2018-12-03 DIAGNOSIS — I10 Essential (primary) hypertension: Secondary | ICD-10-CM | POA: Diagnosis not present

## 2018-12-03 DIAGNOSIS — L97412 Non-pressure chronic ulcer of right heel and midfoot with fat layer exposed: Secondary | ICD-10-CM | POA: Diagnosis not present

## 2018-12-03 DIAGNOSIS — I87323 Chronic venous hypertension (idiopathic) with inflammation of bilateral lower extremity: Secondary | ICD-10-CM | POA: Diagnosis not present

## 2018-12-03 DIAGNOSIS — S91301A Unspecified open wound, right foot, initial encounter: Secondary | ICD-10-CM | POA: Diagnosis not present

## 2018-12-03 DIAGNOSIS — Z86718 Personal history of other venous thrombosis and embolism: Secondary | ICD-10-CM | POA: Diagnosis not present

## 2018-12-05 DIAGNOSIS — S91301D Unspecified open wound, right foot, subsequent encounter: Secondary | ICD-10-CM | POA: Diagnosis not present

## 2018-12-05 DIAGNOSIS — N183 Chronic kidney disease, stage 3 (moderate): Secondary | ICD-10-CM | POA: Diagnosis not present

## 2018-12-05 DIAGNOSIS — Z86718 Personal history of other venous thrombosis and embolism: Secondary | ICD-10-CM | POA: Diagnosis not present

## 2018-12-05 DIAGNOSIS — D649 Anemia, unspecified: Secondary | ICD-10-CM | POA: Diagnosis not present

## 2018-12-05 DIAGNOSIS — I129 Hypertensive chronic kidney disease with stage 1 through stage 4 chronic kidney disease, or unspecified chronic kidney disease: Secondary | ICD-10-CM | POA: Diagnosis not present

## 2018-12-05 DIAGNOSIS — E785 Hyperlipidemia, unspecified: Secondary | ICD-10-CM | POA: Diagnosis not present

## 2018-12-05 DIAGNOSIS — Z85038 Personal history of other malignant neoplasm of large intestine: Secondary | ICD-10-CM | POA: Diagnosis not present

## 2018-12-10 DIAGNOSIS — S91301D Unspecified open wound, right foot, subsequent encounter: Secondary | ICD-10-CM | POA: Diagnosis not present

## 2018-12-10 DIAGNOSIS — E785 Hyperlipidemia, unspecified: Secondary | ICD-10-CM | POA: Diagnosis not present

## 2018-12-10 DIAGNOSIS — D649 Anemia, unspecified: Secondary | ICD-10-CM | POA: Diagnosis not present

## 2018-12-10 DIAGNOSIS — I129 Hypertensive chronic kidney disease with stage 1 through stage 4 chronic kidney disease, or unspecified chronic kidney disease: Secondary | ICD-10-CM | POA: Diagnosis not present

## 2018-12-10 DIAGNOSIS — Z86718 Personal history of other venous thrombosis and embolism: Secondary | ICD-10-CM | POA: Diagnosis not present

## 2018-12-10 DIAGNOSIS — N183 Chronic kidney disease, stage 3 (moderate): Secondary | ICD-10-CM | POA: Diagnosis not present

## 2018-12-10 DIAGNOSIS — Z85038 Personal history of other malignant neoplasm of large intestine: Secondary | ICD-10-CM | POA: Diagnosis not present

## 2018-12-12 DIAGNOSIS — I10 Essential (primary) hypertension: Secondary | ICD-10-CM | POA: Diagnosis not present

## 2018-12-12 DIAGNOSIS — L97412 Non-pressure chronic ulcer of right heel and midfoot with fat layer exposed: Secondary | ICD-10-CM | POA: Diagnosis not present

## 2018-12-12 DIAGNOSIS — Z9221 Personal history of antineoplastic chemotherapy: Secondary | ICD-10-CM | POA: Diagnosis not present

## 2018-12-12 DIAGNOSIS — Z923 Personal history of irradiation: Secondary | ICD-10-CM | POA: Diagnosis not present

## 2018-12-12 DIAGNOSIS — Z86718 Personal history of other venous thrombosis and embolism: Secondary | ICD-10-CM | POA: Diagnosis not present

## 2018-12-12 DIAGNOSIS — I87323 Chronic venous hypertension (idiopathic) with inflammation of bilateral lower extremity: Secondary | ICD-10-CM | POA: Diagnosis not present

## 2018-12-12 DIAGNOSIS — S91301A Unspecified open wound, right foot, initial encounter: Secondary | ICD-10-CM | POA: Diagnosis not present

## 2018-12-18 DIAGNOSIS — N183 Chronic kidney disease, stage 3 (moderate): Secondary | ICD-10-CM | POA: Diagnosis not present

## 2018-12-18 DIAGNOSIS — I129 Hypertensive chronic kidney disease with stage 1 through stage 4 chronic kidney disease, or unspecified chronic kidney disease: Secondary | ICD-10-CM | POA: Diagnosis not present

## 2018-12-18 DIAGNOSIS — D649 Anemia, unspecified: Secondary | ICD-10-CM | POA: Diagnosis not present

## 2018-12-18 DIAGNOSIS — Z85038 Personal history of other malignant neoplasm of large intestine: Secondary | ICD-10-CM | POA: Diagnosis not present

## 2018-12-18 DIAGNOSIS — Z86718 Personal history of other venous thrombosis and embolism: Secondary | ICD-10-CM | POA: Diagnosis not present

## 2018-12-18 DIAGNOSIS — E785 Hyperlipidemia, unspecified: Secondary | ICD-10-CM | POA: Diagnosis not present

## 2018-12-18 DIAGNOSIS — S91301D Unspecified open wound, right foot, subsequent encounter: Secondary | ICD-10-CM | POA: Diagnosis not present

## 2018-12-24 DIAGNOSIS — Z85038 Personal history of other malignant neoplasm of large intestine: Secondary | ICD-10-CM | POA: Diagnosis not present

## 2018-12-24 DIAGNOSIS — E785 Hyperlipidemia, unspecified: Secondary | ICD-10-CM | POA: Diagnosis not present

## 2018-12-24 DIAGNOSIS — S91301D Unspecified open wound, right foot, subsequent encounter: Secondary | ICD-10-CM | POA: Diagnosis not present

## 2018-12-24 DIAGNOSIS — N183 Chronic kidney disease, stage 3 (moderate): Secondary | ICD-10-CM | POA: Diagnosis not present

## 2018-12-24 DIAGNOSIS — D649 Anemia, unspecified: Secondary | ICD-10-CM | POA: Diagnosis not present

## 2018-12-24 DIAGNOSIS — Z86718 Personal history of other venous thrombosis and embolism: Secondary | ICD-10-CM | POA: Diagnosis not present

## 2018-12-24 DIAGNOSIS — I129 Hypertensive chronic kidney disease with stage 1 through stage 4 chronic kidney disease, or unspecified chronic kidney disease: Secondary | ICD-10-CM | POA: Diagnosis not present

## 2018-12-25 DIAGNOSIS — L84 Corns and callosities: Secondary | ICD-10-CM | POA: Diagnosis not present

## 2018-12-25 DIAGNOSIS — I10 Essential (primary) hypertension: Secondary | ICD-10-CM | POA: Diagnosis not present

## 2018-12-25 DIAGNOSIS — Z9221 Personal history of antineoplastic chemotherapy: Secondary | ICD-10-CM | POA: Diagnosis not present

## 2018-12-25 DIAGNOSIS — L97412 Non-pressure chronic ulcer of right heel and midfoot with fat layer exposed: Secondary | ICD-10-CM | POA: Diagnosis not present

## 2018-12-25 DIAGNOSIS — Z923 Personal history of irradiation: Secondary | ICD-10-CM | POA: Diagnosis not present

## 2018-12-25 DIAGNOSIS — Z86718 Personal history of other venous thrombosis and embolism: Secondary | ICD-10-CM | POA: Diagnosis not present

## 2018-12-25 DIAGNOSIS — I87323 Chronic venous hypertension (idiopathic) with inflammation of bilateral lower extremity: Secondary | ICD-10-CM | POA: Diagnosis not present

## 2018-12-25 DIAGNOSIS — L97419 Non-pressure chronic ulcer of right heel and midfoot with unspecified severity: Secondary | ICD-10-CM | POA: Diagnosis not present

## 2018-12-27 DIAGNOSIS — D649 Anemia, unspecified: Secondary | ICD-10-CM | POA: Diagnosis not present

## 2018-12-27 DIAGNOSIS — E785 Hyperlipidemia, unspecified: Secondary | ICD-10-CM | POA: Diagnosis not present

## 2018-12-27 DIAGNOSIS — N183 Chronic kidney disease, stage 3 (moderate): Secondary | ICD-10-CM | POA: Diagnosis not present

## 2018-12-27 DIAGNOSIS — Z86718 Personal history of other venous thrombosis and embolism: Secondary | ICD-10-CM | POA: Diagnosis not present

## 2018-12-27 DIAGNOSIS — Z85038 Personal history of other malignant neoplasm of large intestine: Secondary | ICD-10-CM | POA: Diagnosis not present

## 2018-12-27 DIAGNOSIS — S91301D Unspecified open wound, right foot, subsequent encounter: Secondary | ICD-10-CM | POA: Diagnosis not present

## 2018-12-27 DIAGNOSIS — I129 Hypertensive chronic kidney disease with stage 1 through stage 4 chronic kidney disease, or unspecified chronic kidney disease: Secondary | ICD-10-CM | POA: Diagnosis not present

## 2019-03-20 DIAGNOSIS — Z79899 Other long term (current) drug therapy: Secondary | ICD-10-CM | POA: Diagnosis not present

## 2019-03-20 DIAGNOSIS — Z8673 Personal history of transient ischemic attack (TIA), and cerebral infarction without residual deficits: Secondary | ICD-10-CM | POA: Diagnosis not present

## 2019-03-20 DIAGNOSIS — N183 Chronic kidney disease, stage 3 (moderate): Secondary | ICD-10-CM | POA: Diagnosis not present

## 2019-03-20 DIAGNOSIS — R269 Unspecified abnormalities of gait and mobility: Secondary | ICD-10-CM | POA: Diagnosis not present

## 2019-03-20 DIAGNOSIS — Z0000 Encounter for general adult medical examination without abnormal findings: Secondary | ICD-10-CM | POA: Diagnosis not present

## 2019-03-20 DIAGNOSIS — N390 Urinary tract infection, site not specified: Secondary | ICD-10-CM | POA: Diagnosis not present

## 2019-03-20 DIAGNOSIS — Z7189 Other specified counseling: Secondary | ICD-10-CM | POA: Diagnosis not present

## 2019-03-20 DIAGNOSIS — I129 Hypertensive chronic kidney disease with stage 1 through stage 4 chronic kidney disease, or unspecified chronic kidney disease: Secondary | ICD-10-CM | POA: Diagnosis not present

## 2019-03-20 DIAGNOSIS — E782 Mixed hyperlipidemia: Secondary | ICD-10-CM | POA: Diagnosis not present

## 2019-04-07 DIAGNOSIS — M310 Hypersensitivity angiitis: Secondary | ICD-10-CM | POA: Diagnosis not present

## 2019-04-07 DIAGNOSIS — I129 Hypertensive chronic kidney disease with stage 1 through stage 4 chronic kidney disease, or unspecified chronic kidney disease: Secondary | ICD-10-CM | POA: Diagnosis not present

## 2019-04-07 DIAGNOSIS — E782 Mixed hyperlipidemia: Secondary | ICD-10-CM | POA: Diagnosis not present

## 2019-04-07 DIAGNOSIS — Z0000 Encounter for general adult medical examination without abnormal findings: Secondary | ICD-10-CM | POA: Diagnosis not present

## 2019-07-14 DIAGNOSIS — Z23 Encounter for immunization: Secondary | ICD-10-CM | POA: Diagnosis not present

## 2019-09-04 ENCOUNTER — Other Ambulatory Visit: Payer: Self-pay

## 2019-09-04 ENCOUNTER — Emergency Department (HOSPITAL_COMMUNITY): Payer: Medicare Other

## 2019-09-04 ENCOUNTER — Inpatient Hospital Stay (HOSPITAL_COMMUNITY)
Admission: EM | Admit: 2019-09-04 | Discharge: 2019-09-09 | DRG: 375 | Disposition: A | Discharge status: Home Health | Payer: Medicare Other | Attending: Internal Medicine | Admitting: Internal Medicine

## 2019-09-04 DIAGNOSIS — H9190 Unspecified hearing loss, unspecified ear: Secondary | ICD-10-CM | POA: Diagnosis not present

## 2019-09-04 DIAGNOSIS — D649 Anemia, unspecified: Secondary | ICD-10-CM | POA: Diagnosis not present

## 2019-09-04 DIAGNOSIS — Z79899 Other long term (current) drug therapy: Secondary | ICD-10-CM

## 2019-09-04 DIAGNOSIS — R1084 Generalized abdominal pain: Secondary | ICD-10-CM | POA: Diagnosis not present

## 2019-09-04 DIAGNOSIS — I351 Nonrheumatic aortic (valve) insufficiency: Secondary | ICD-10-CM | POA: Diagnosis not present

## 2019-09-04 DIAGNOSIS — Z9049 Acquired absence of other specified parts of digestive tract: Secondary | ICD-10-CM | POA: Diagnosis not present

## 2019-09-04 DIAGNOSIS — D62 Acute posthemorrhagic anemia: Secondary | ICD-10-CM | POA: Diagnosis not present

## 2019-09-04 DIAGNOSIS — I130 Hypertensive heart and chronic kidney disease with heart failure and stage 1 through stage 4 chronic kidney disease, or unspecified chronic kidney disease: Secondary | ICD-10-CM | POA: Diagnosis present

## 2019-09-04 DIAGNOSIS — I447 Left bundle-branch block, unspecified: Secondary | ICD-10-CM | POA: Diagnosis present

## 2019-09-04 DIAGNOSIS — K449 Diaphragmatic hernia without obstruction or gangrene: Secondary | ICD-10-CM | POA: Diagnosis not present

## 2019-09-04 DIAGNOSIS — C49A2 Gastrointestinal stromal tumor of stomach: Principal | ICD-10-CM | POA: Diagnosis present

## 2019-09-04 DIAGNOSIS — E876 Hypokalemia: Secondary | ICD-10-CM | POA: Diagnosis present

## 2019-09-04 DIAGNOSIS — I352 Nonrheumatic aortic (valve) stenosis with insufficiency: Secondary | ICD-10-CM | POA: Diagnosis not present

## 2019-09-04 DIAGNOSIS — K3189 Other diseases of stomach and duodenum: Secondary | ICD-10-CM | POA: Diagnosis not present

## 2019-09-04 DIAGNOSIS — I350 Nonrheumatic aortic (valve) stenosis: Secondary | ICD-10-CM | POA: Diagnosis not present

## 2019-09-04 DIAGNOSIS — K760 Fatty (change of) liver, not elsewhere classified: Secondary | ICD-10-CM | POA: Diagnosis not present

## 2019-09-04 DIAGNOSIS — I959 Hypotension, unspecified: Secondary | ICD-10-CM | POA: Diagnosis not present

## 2019-09-04 DIAGNOSIS — N183 Chronic kidney disease, stage 3 unspecified: Secondary | ICD-10-CM | POA: Diagnosis present

## 2019-09-04 DIAGNOSIS — Z03818 Encounter for observation for suspected exposure to other biological agents ruled out: Secondary | ICD-10-CM | POA: Diagnosis not present

## 2019-09-04 DIAGNOSIS — Z20828 Contact with and (suspected) exposure to other viral communicable diseases: Secondary | ICD-10-CM | POA: Diagnosis present

## 2019-09-04 DIAGNOSIS — E7800 Pure hypercholesterolemia, unspecified: Secondary | ICD-10-CM | POA: Diagnosis not present

## 2019-09-04 DIAGNOSIS — R1111 Vomiting without nausea: Secondary | ICD-10-CM | POA: Diagnosis not present

## 2019-09-04 DIAGNOSIS — E872 Acidosis: Secondary | ICD-10-CM | POA: Diagnosis present

## 2019-09-04 DIAGNOSIS — I471 Supraventricular tachycardia: Secondary | ICD-10-CM | POA: Diagnosis present

## 2019-09-04 DIAGNOSIS — K8020 Calculus of gallbladder without cholecystitis without obstruction: Secondary | ICD-10-CM | POA: Diagnosis present

## 2019-09-04 DIAGNOSIS — I5042 Chronic combined systolic (congestive) and diastolic (congestive) heart failure: Secondary | ICD-10-CM | POA: Diagnosis present

## 2019-09-04 DIAGNOSIS — R0600 Dyspnea, unspecified: Secondary | ICD-10-CM | POA: Diagnosis not present

## 2019-09-04 DIAGNOSIS — K920 Hematemesis: Secondary | ICD-10-CM | POA: Diagnosis not present

## 2019-09-04 DIAGNOSIS — I429 Cardiomyopathy, unspecified: Secondary | ICD-10-CM | POA: Diagnosis present

## 2019-09-04 DIAGNOSIS — Z85038 Personal history of other malignant neoplasm of large intestine: Secondary | ICD-10-CM | POA: Diagnosis not present

## 2019-09-04 DIAGNOSIS — K922 Gastrointestinal hemorrhage, unspecified: Secondary | ICD-10-CM | POA: Diagnosis present

## 2019-09-04 DIAGNOSIS — Z95828 Presence of other vascular implants and grafts: Secondary | ICD-10-CM

## 2019-09-04 DIAGNOSIS — R58 Hemorrhage, not elsewhere classified: Secondary | ICD-10-CM | POA: Diagnosis not present

## 2019-09-04 DIAGNOSIS — N179 Acute kidney failure, unspecified: Secondary | ICD-10-CM | POA: Diagnosis present

## 2019-09-04 DIAGNOSIS — K5750 Diverticulosis of both small and large intestine without perforation or abscess without bleeding: Secondary | ICD-10-CM | POA: Diagnosis present

## 2019-09-04 DIAGNOSIS — I340 Nonrheumatic mitral (valve) insufficiency: Secondary | ICD-10-CM | POA: Diagnosis not present

## 2019-09-04 DIAGNOSIS — I209 Angina pectoris, unspecified: Secondary | ICD-10-CM | POA: Diagnosis not present

## 2019-09-04 DIAGNOSIS — E785 Hyperlipidemia, unspecified: Secondary | ICD-10-CM | POA: Diagnosis not present

## 2019-09-04 DIAGNOSIS — D490 Neoplasm of unspecified behavior of digestive system: Secondary | ICD-10-CM | POA: Diagnosis not present

## 2019-09-04 DIAGNOSIS — Z9221 Personal history of antineoplastic chemotherapy: Secondary | ICD-10-CM | POA: Diagnosis not present

## 2019-09-04 DIAGNOSIS — R1011 Right upper quadrant pain: Secondary | ICD-10-CM | POA: Diagnosis not present

## 2019-09-04 DIAGNOSIS — K5710 Diverticulosis of small intestine without perforation or abscess without bleeding: Secondary | ICD-10-CM | POA: Diagnosis not present

## 2019-09-04 DIAGNOSIS — Z743 Need for continuous supervision: Secondary | ICD-10-CM | POA: Diagnosis not present

## 2019-09-04 DIAGNOSIS — I10 Essential (primary) hypertension: Secondary | ICD-10-CM | POA: Diagnosis present

## 2019-09-04 DIAGNOSIS — Z86718 Personal history of other venous thrombosis and embolism: Secondary | ICD-10-CM

## 2019-09-04 DIAGNOSIS — I5041 Acute combined systolic (congestive) and diastolic (congestive) heart failure: Secondary | ICD-10-CM

## 2019-09-04 DIAGNOSIS — K5730 Diverticulosis of large intestine without perforation or abscess without bleeding: Secondary | ICD-10-CM | POA: Diagnosis not present

## 2019-09-04 DIAGNOSIS — I129 Hypertensive chronic kidney disease with stage 1 through stage 4 chronic kidney disease, or unspecified chronic kidney disease: Secondary | ICD-10-CM | POA: Diagnosis not present

## 2019-09-04 LAB — CBC
HCT: 33.3 % — ABNORMAL LOW (ref 36.0–46.0)
Hemoglobin: 11 g/dL — ABNORMAL LOW (ref 12.0–15.0)
MCH: 31.6 pg (ref 26.0–34.0)
MCHC: 33 g/dL (ref 30.0–36.0)
MCV: 95.7 fL (ref 80.0–100.0)
Platelets: 227 10*3/uL (ref 150–400)
RBC: 3.48 MIL/uL — ABNORMAL LOW (ref 3.87–5.11)
RDW: 12.6 % (ref 11.5–15.5)
WBC: 11 10*3/uL — ABNORMAL HIGH (ref 4.0–10.5)
nRBC: 0 % (ref 0.0–0.2)

## 2019-09-04 LAB — GAMMA GT: GGT: 16 U/L (ref 7–50)

## 2019-09-04 LAB — TYPE AND SCREEN
ABO/RH(D): B POS
Antibody Screen: NEGATIVE

## 2019-09-04 LAB — COMPREHENSIVE METABOLIC PANEL
ALT: 13 U/L (ref 0–44)
AST: 21 U/L (ref 15–41)
Albumin: 3.4 g/dL — ABNORMAL LOW (ref 3.5–5.0)
Alkaline Phosphatase: 43 U/L (ref 38–126)
Anion gap: 13 (ref 5–15)
BUN: 55 mg/dL — ABNORMAL HIGH (ref 8–23)
CO2: 22 mmol/L (ref 22–32)
Calcium: 9.3 mg/dL (ref 8.9–10.3)
Chloride: 105 mmol/L (ref 98–111)
Creatinine, Ser: 1.76 mg/dL — ABNORMAL HIGH (ref 0.44–1.00)
GFR calc Af Amer: 28 mL/min — ABNORMAL LOW (ref >60)
GFR calc non Af Amer: 24 mL/min — ABNORMAL LOW (ref >60)
Glucose, Bld: 126 mg/dL — ABNORMAL HIGH (ref 70–99)
Potassium: 4.6 mmol/L (ref 3.5–5.1)
Sodium: 140 mmol/L (ref 135–145)
Total Bilirubin: 0.7 mg/dL (ref 0.3–1.2)
Total Protein: 7.4 g/dL (ref 6.5–8.1)

## 2019-09-04 LAB — LACTIC ACID, PLASMA
Lactic Acid, Venous: 4 mmol/L (ref 0.5–1.9)
Lactic Acid, Venous: 4.7 mmol/L (ref 0.5–1.9)

## 2019-09-04 LAB — PROTIME-INR
INR: 1.2 (ref 0.8–1.2)
Prothrombin Time: 14.8 s (ref 11.4–15.2)

## 2019-09-04 LAB — APTT: aPTT: 33 s (ref 24–36)

## 2019-09-04 LAB — MRSA PCR SCREENING: MRSA by PCR: NEGATIVE

## 2019-09-04 LAB — LIPASE, BLOOD: Lipase: 21 U/L (ref 11–51)

## 2019-09-04 MED ORDER — CYCLOBENZAPRINE HCL 10 MG PO TABS: 10.0000 mg | ORAL_TABLET | Freq: Two times a day (BID) | ORAL | Status: DC | PRN

## 2019-09-04 MED ORDER — ACETAMINOPHEN 500 MG PO TABS: 1000.0000 mg | ORAL_TABLET | Freq: Two times a day (BID) | ORAL | Status: DC | PRN

## 2019-09-04 MED ORDER — SODIUM CHLORIDE 0.9 % IV BOLUS
1000.0000 mL | Freq: Once | INTRAVENOUS | Status: AC
  Administered 2019-09-04: 1000 mL via INTRAVENOUS

## 2019-09-04 MED ORDER — SODIUM CHLORIDE 0.9 % IV SOLN
INTRAVENOUS | Status: AC
  Administered 2019-09-04 – 2019-09-05 (×2): via INTRAVENOUS

## 2019-09-04 MED ORDER — SODIUM CHLORIDE 0.9 % IV SOLN
80.0000 mg | Freq: Once | INTRAVENOUS | Status: AC
  Administered 2019-09-04: 80 mg via INTRAVENOUS
  Filled 2019-09-04 (×4): qty 80

## 2019-09-04 MED ORDER — FUROSEMIDE 40 MG PO TABS
40.0000 mg | ORAL_TABLET | Freq: Every day | ORAL | Status: DC
  Administered 2019-09-04: 40 mg via ORAL
  Filled 2019-09-04: qty 1

## 2019-09-04 MED ORDER — PANTOPRAZOLE SODIUM 40 MG IV SOLR
40.0000 mg | Freq: Once | INTRAVENOUS | Status: AC
  Administered 2019-09-04: 40 mg via INTRAVENOUS
  Filled 2019-09-04: qty 40

## 2019-09-04 MED ORDER — CHLORHEXIDINE GLUCONATE CLOTH 2 % EX PADS
6.0000 | MEDICATED_PAD | Freq: Every day | CUTANEOUS | Status: DC
  Administered 2019-09-05 – 2019-09-08 (×4): 6 via TOPICAL

## 2019-09-04 MED ORDER — NORTRIPTYLINE HCL 10 MG PO CAPS
10.0000 mg | ORAL_CAPSULE | Freq: Two times a day (BID) | ORAL | Status: DC
  Administered 2019-09-04: 10 mg via ORAL
  Filled 2019-09-04 (×2): qty 1

## 2019-09-04 MED ORDER — ACETAMINOPHEN 325 MG PO TABS: 650.0000 mg | ORAL_TABLET | Freq: Four times a day (QID) | ORAL | Status: DC | PRN

## 2019-09-04 MED ORDER — ACETAMINOPHEN 650 MG RE SUPP: 650.0000 mg | Freq: Four times a day (QID) | RECTAL | Status: DC | PRN

## 2019-09-04 MED ORDER — DONEPEZIL HCL 10 MG PO TABS
10.0000 mg | ORAL_TABLET | Freq: Every day | ORAL | Status: DC
  Administered 2019-09-04: 10 mg via ORAL
  Filled 2019-09-04: qty 1

## 2019-09-04 MED ORDER — SODIUM CHLORIDE 0.9 % IV SOLN
8.0000 mg/h | INTRAVENOUS | Status: DC
  Administered 2019-09-04 – 2019-09-06 (×4): 8 mg/h via INTRAVENOUS
  Filled 2019-09-04 (×8): qty 80

## 2019-09-04 MED ORDER — PANTOPRAZOLE SODIUM 40 MG IV SOLR: 40.0000 mg | Freq: Two times a day (BID) | INTRAVENOUS | Status: DC

## 2019-09-04 NOTE — ED Provider Notes (Signed)
[facility] Emergency Department Provider Note MRN:  [id number]  Arrival date & time: 09/04/19     Chief Complaint   Hematochezia History of Present Illness   Julia Robbins is a 85 y.o. year-old female with a history of DVT, colon cancer presenting to the ED with chief complaint of hematochezia.  Yesterday and today patient endorses large-volume hematochezia.  Also endorsing moderate to severe right lower quadrant abdominal pain.  Denies blood thinners.  Feels tired.  Denies headache or vision change, no chest pain or shortness of breath, no vomiting.  Review of Systems  A complete 10 system review of systems was obtained and all systems are negative except as noted in the HPI and PMH.   Patient's Health History    Past Medical History:  Diagnosis Date  . Abnormal EKG    hx left bundle branch block dating back to 2012 on old ekg's  . Arthritis    degenerative.   . Cholelithiasis 2010   15 mm CBD on CT scan 2010.   Marland Kitchen Colon cancer ([place]) 1998   chemo and colectomy.  T3 N1 stage 3, 2/15 nodes +.   . DVT (deep venous thrombosis) (HCC)    right leg  . Elevated cholesterol   . Family history of adverse reaction to anesthesia    daughter has post op n/v  . Gastric ulcer 1998  . Greenfield filter in place 1998   for DVT. right leg  . HOH (hard of hearing)    slight    Past Surgical History:  Procedure Laterality Date  . COLON SURGERY  1998  . COLONOSCOPY WITH PROPOFOL N/A 04/12/2017   Procedure: COLONOSCOPY WITH PROPOFOL;  Surgeon: Milus Banister, MD;  Location: [facility];  Service: Endoscopy;  Laterality: N/A;  . DILATION AND CURETTAGE OF UTERUS  yrs ago  . ESOPHAGOGASTRODUODENOSCOPY (EGD) WITH PROPOFOL N/A 03/08/2017   Procedure: ESOPHAGOGASTRODUODENOSCOPY (EGD) WITH PROPOFOL;  Surgeon: Milus Banister, MD;  Location: [facility];  Service: Endoscopy;  Laterality: N/A;    Family History  Problem Relation Age of Onset  . Heart disease Mother    . Colon cancer Neg Hx   . Stomach cancer Neg Hx   . Esophageal cancer Neg Hx   . Pancreatic cancer Neg Hx   . Liver disease Neg Hx     Social History   Socioeconomic History  . Marital status: Widowed    Spouse name: Not on file  . Number of children: Not on file  . Years of education: Not on file  . Highest education level: Not on file  Occupational History  . Not on file  Social Needs  . Financial resource strain: Not on file  . Food insecurity    Worry: Not on file    Inability: Not on file  . Transportation needs    Medical: Not on file    Non-medical: Not on file  Tobacco Use  . Smoking status: Never Smoker  . Smokeless tobacco: Never Used  Substance and Sexual Activity  . Alcohol use: No  . Drug use: No  . Sexual activity: Never  Lifestyle  . Physical activity    Days per week: Not on file    Minutes per session: Not on file  . Stress: Not on file  Relationships  . Social Herbalist on phone: Not on file    Gets together: Not on file    Attends religious service: Not on file  Active member of club or organization: Not on file    Attends meetings of clubs or organizations: Not on file    Relationship status: Not on file  . Intimate partner violence    Fear of current or ex partner: Not on file    Emotionally abused: Not on file    Physically abused: Not on file    Forced sexual activity: Not on file  Other Topics Concern  . Not on file  Social History Narrative  . Not on file     Physical Exam  Vital Signs and Nursing Notes reviewed Vitals:   09/04/19 2007 09/04/19 2009  BP: 121/82   Pulse: (!) 113 (!) 113  Resp: 20   Temp:    SpO2: 100% 100%    CONSTITUTIONAL: Well-appearing, NAD NEURO:  Alert and oriented x 3, no focal deficits EYES:  eyes equal and reactive ENT/NECK:  no LAD, no JVD CARDIO: Tachycardic rate, well-perfused, normal S1 and S2 PULM:  CTAB no wheezing or rhonchi GI/GU:  normal bowel sounds, non-distended,  significant right lower quadrant tenderness to palpation MSK/SPINE:  No gross deformities, no edema SKIN:  no rash, atraumatic PSYCH:  Appropriate speech and behavior  Diagnostic and Interventional Summary    EKG Interpretation  Date/Time:    Ventricular Rate:    PR Interval:    QRS Duration:   QT Interval:    QTC Calculation:   R Axis:     Text Interpretation:        Labs Reviewed  CBC - Abnormal; Notable for the following components:      Result Value   WBC 11.0 (*)    RBC 3.48 (*)    Hemoglobin 11.0 (*)    HCT 33.3 (*)    All other components within normal limits  COMPREHENSIVE METABOLIC PANEL - Abnormal; Notable for the following components:   Glucose, Bld 126 (*)    BUN 55 (*)    Creatinine, Ser 1.76 (*)    Albumin 3.4 (*)    GFR calc non Af Amer 24 (*)    GFR calc Af Amer 28 (*)    All other components within normal limits  LACTIC ACID, PLASMA - Abnormal; Notable for the following components:   Lactic Acid, Venous 4.0 (*)    All other components within normal limits  SARS CORONAVIRUS 2 (TAT 6-24 HRS)  PROTIME-INR  APTT  LACTIC ACID, PLASMA  TYPE AND SCREEN    CT ABDOMEN PELVIS WO CONTRAST  Final Result      Medications  sodium chloride 0.9 % bolus 1,000 mL (has no administration in time range)  sodium chloride 0.9 % bolus 1,000 mL (1,000 mLs Intravenous New Bag/Given 09/04/19 1924)  pantoprazole (PROTONIX) injection 40 mg (40 mg Intravenous Given 09/04/19 2009)     Procedures  /  Critical Care .Critical Care Performed by: Maudie Flakes, MD Authorized by: Maudie Flakes, MD   Critical care provider statement:    Critical care time (minutes):  35   Critical care was necessary to treat or prevent imminent or life-threatening deterioration of the following conditions:  Shock (Lower GI bleed with hypotension)   Critical care was time spent personally by me on the following activities:  Discussions with consultants, evaluation of patient's response to  treatment, examination of patient, ordering and performing treatments and interventions, ordering and review of laboratory studies, ordering and review of radiographic studies, pulse oximetry, re-evaluation of patient's condition, obtaining history from patient or surrogate and  review of old charts    ED Course and Medical Decision Making  I have reviewed the triage vital signs and the nursing notes.  Pertinent labs & imaging results that were available during my care of the patient were reviewed by me and considered in my medical decision making (see below for details).     Large-volume hematochezia, significant right lower quadrant tenderness, patient arrives with heart rate of 120, hypotensive with systolics in the [id number], however normal mentation, generally well-appearing.  Will provide IV fluids, evaluate with laboratory assessment.  Patient will also need CT imaging given the significant tenderness.  Frank hematochezia on exam.  Will need admission.  History of DVT denies blood thinners.  Patient is fluid responsive, lactate of 4 prior to resuscitation, will repeat.  Second liter of fluids ordered.  Will admit to stepdown unit.  Barth Kirks. Sedonia Small, MD [place] [email or web address]  Final Clinical Impressions(s) / ED Diagnoses     ICD-10-CM   1. Symptomatic anemia  D64.9   2. Gastrointestinal hemorrhage, unspecified gastrointestinal hemorrhage type  K92.2   3. Hypotension, unspecified hypotension type  I95.9     ED Discharge Orders    None       Discharge Instructions Discussed with and Provided to Patient:   Discharge Instructions   None       Maudie Flakes, MD 09/04/19 2056

## 2019-09-04 NOTE — ED Notes (Signed)
Patient transported to CT 

## 2019-09-04 NOTE — ED Notes (Addendum)
ED TO INPATIENT HANDOFF REPORT  ED Nurse Name and Phone #:  Rico Junker, RN [phone]  S Name/Age/Gender Julia Robbins 85 y.o. female Room/Bed: WA23/WA23  Code Status   Code Status: Full Code  Home/SNF/Other Home Patient oriented to: self, place, time and situation Is this baseline? Yes   Triage Complete: Triage complete  Chief Complaint GI bleed, abd pain tenderness  Triage Note Per EMS: Pt from home.  Daughter came to check on pt and found blood on her pants, brief, toilet.  Pt c/o of lower abdominal pain.  Pt reports blood in vomit.  Pt hx dementia. 116/74 Pulse 70 145 CBG    Allergies No Known Allergies  Level of Care/Admitting Diagnosis ED Disposition    ED Disposition Condition Comment   Admit  Hospital Area: [place] [[id number]]  Level of Care: Stepdown [14]  Admit to SDU based on following criteria: Severe physiological/psychological symptoms:  Any diagnosis requiring assessment & intervention at least every 4 hours on an ongoing basis to obtain desired patient outcomes including stability and rehabilitation  Covid Evaluation: Asymptomatic Screening Protocol (No Symptoms)  Diagnosis: Acute lower GI bleeding [id number]  Admitting Physician: Neena Rhymes [[id number]]  Attending Physician: Adella Hare E [[id number]]  Estimated length of stay: past midnight tomorrow  Certification:: I certify this patient will need inpatient services for at least 2 midnights  PT Class (Do Not Modify): Inpatient [101]  PT Acc Code (Do Not Modify): Private [1]       B Medical/Surgery History Past Medical History:  Diagnosis Date  . Abnormal EKG    hx left bundle branch block dating back to 2012 on old ekg's  . Arthritis    degenerative.   . Cholelithiasis 2010   15 mm CBD on CT scan 2010.   Marland Kitchen Colon cancer ([place]) 1998   chemo and colectomy.  T3 N1 stage 3, 2/15 nodes +.   . DVT (deep venous thrombosis) (HCC)    right leg  . Elevated cholesterol   .  Family history of adverse reaction to anesthesia    daughter has post op n/v  . Gastric ulcer 1998  . Greenfield filter in place 1998   for DVT. right leg  . HOH (hard of hearing)    slight   Past Surgical History:  Procedure Laterality Date  . COLON SURGERY  1998  . COLONOSCOPY WITH PROPOFOL N/A 04/12/2017   Procedure: COLONOSCOPY WITH PROPOFOL;  Surgeon: Milus Banister, MD;  [facility];  Service: Endoscopy;  Laterality: N/A;  . DILATION AND CURETTAGE OF UTERUS  yrs ago  . ESOPHAGOGASTRODUODENOSCOPY (EGD) WITH PROPOFOL N/A 03/08/2017   Procedure: ESOPHAGOGASTRODUODENOSCOPY (EGD) WITH PROPOFOL;  Surgeon: Milus Banister, MD;  [facility];  Service: Endoscopy;  Laterality: N/A;     A IV Location/Drains/Wounds Patient Lines/Drains/Airways Status   Active Line/Drains/Airways    Name:   Placement date:   Placement time:   Site:   Days:   Peripheral IV 09/04/19 Right Antecubital   09/04/19    1859    Antecubital   less than 1   Peripheral IV 09/04/19 Left Forearm   09/04/19    1900    Forearm   less than 1          Intake/Output Last 24 hours  Intake/Output Summary (Last 24 hours) at 09/04/2019 2149 Last data filed at 09/04/2019 2129 Gross per 24 hour  Intake 1000 ml  Output -  Net 1000 ml  Labs/Imaging Results for orders placed or performed during the hospital encounter of 09/04/19 (from the past 48 hour(s))  Type and screen [place]     Status: None   Collection Time: 09/04/19  6:55 PM  Result Value Ref Range   ABO/RH(D) B POS    Antibody Screen NEG    Sample Expiration      09/07/2019,2359 Performed at [facility], [place] [address] [place], [place] [address]   CBC     Status: Abnormal   Collection Time: 09/04/19  7:28 PM  Result Value Ref Range   WBC 11.0 (H) 4.0 - 10.5 K/uL   RBC 3.48 (L) 3.87 - 5.11 MIL/uL   Hemoglobin 11.0 (L) 12.0 - 15.0 g/dL   HCT 33.3 (L) 36.0 - 46.0 %   MCV 95.7 80.0 - 100.0  fL   MCH 31.6 26.0 - 34.0 pg   MCHC 33.0 30.0 - 36.0 g/dL   RDW 12.6 11.5 - 15.5 %   Platelets 227 150 - 400 K/uL   nRBC 0.0 0.0 - 0.2 %    Comment: Performed at [facility], [place] [address], [place], [place] [address]  CMP     Status: Abnormal   Collection Time: 09/04/19  7:28 PM  Result Value Ref Range   Sodium 140 135 - 145 mmol/L   Potassium 4.6 3.5 - 5.1 mmol/L   Chloride 105 98 - 111 mmol/L   CO2 22 22 - 32 mmol/L   Glucose, Bld 126 (H) 70 - 99 mg/dL   BUN 55 (H) 8 - 23 mg/dL   Creatinine, Ser 1.76 (H) 0.44 - 1.00 mg/dL   Calcium 9.3 8.9 - 10.3 mg/dL   Total Protein 7.4 6.5 - 8.1 g/dL   Albumin 3.4 (L) 3.5 - 5.0 g/dL   AST 21 15 - 41 U/L   ALT 13 0 - 44 U/L   Alkaline Phosphatase 43 38 - 126 U/L   Total Bilirubin 0.7 0.3 - 1.2 mg/dL   GFR calc non Af Amer 24 (L) >60 mL/min   GFR calc Af Amer 28 (L) >60 mL/min   Anion gap 13 5 - 15    Comment: Performed at [facility], [place] [address] [place], [place] [address]  PT     Status: None   Collection Time: 09/04/19  7:28 PM  Result Value Ref Range   Prothrombin Time 14.8 11.4 - 15.2 seconds   INR 1.2 0.8 - 1.2    Comment: (NOTE) INR goal varies based on device and disease states. Performed at [facility], [place] [address], [place], [place] [address]   PTT     Status: None   Collection Time: 09/04/19  7:28 PM  Result Value Ref Range   aPTT 33 24 - 36 seconds    Comment: Performed at [facility], [place] [address], [place], [place] [address]  Lactic acid, plasma     Status: Abnormal   Collection Time: 09/04/19  7:57 PM  Result Value Ref Range   Lactic Acid, Venous 4.0 (HH) 0.5 - 1.9 mmol/L    Comment: CRITICAL RESULT CALLED TO, READ BACK BY AND VERIFIED WITH: RN B BROOKS AT 2045 09/04/19 CRUICKSHANK A Performed at [facility], [place] [address], [place], [place] [address]    Ct Abdomen Pelvis Wo Contrast  Result Date:  09/04/2019 CLINICAL DATA:  Right lower quadrant pain. Right red blood per rectum. EXAM: CT ABDOMEN  AND PELVIS WITHOUT CONTRAST TECHNIQUE: Multidetector CT imaging of the abdomen and pelvis was performed following the standard protocol without IV contrast. COMPARISON:  02/14/2017 FINDINGS: Lower chest: Scarring/fibrosis in the lung bases. No acute abnormality. Hepatobiliary: Gallstones fill the gallbladder. No focal hepatic abnormality. Pancreas: No focal abnormality or ductal dilatation. Spleen: No focal abnormality.  Normal size. Adrenals/Urinary Tract: No adrenal abnormality. No focal renal abnormality. No stones or hydronephrosis. Urinary bladder is unremarkable. Stomach/Bowel: Sigmoid diverticulosis. No active diverticulitis. Stomach and small bowel decompressed. No bowel obstruction. Vascular/Lymphatic: Aortic atherosclerosis. No enlarged abdominal or pelvic lymph nodes. IVC filter in place. Reproductive: Uterus and adnexa unremarkable.  No mass. Other: No free fluid or free air. Musculoskeletal: No acute bony abnormality. IMPRESSION: Sigmoid diverticulosis.  No active diverticulitis. Cholelithiasis. Aortic atherosclerosis. No acute findings. Electronically Signed   By: Rolm Baptise M.D.   On: 09/04/2019 20:43    Pending Labs Unresulted Labs (From admission, onward)    Start     Ordered   09/04/19 2155  Lactic acid, plasma  ONCE - STAT,   STAT     09/04/19 2054   09/04/19 2147  Gamma GT  Add-on,   AD     09/04/19 2146   09/04/19 2146  Lipase, blood  Add-on,   AD     09/04/19 2146   09/04/19 2055  SARS CORONAVIRUS 2 (TAT 6-24 HRS) Nasopharyngeal Nasopharyngeal Swab  (Asymptomatic/Tier 3)  Once,   STAT    Question Answer Comment  Is this test for diagnosis or screening Screening   Symptomatic for COVID-19 as defined by CDC No   Hospitalized for COVID-19 No   Admitted to ICU for COVID-19 No   Previously tested for COVID-19 No   Resident in a congregate (group) care setting Unknown   Employed in  healthcare setting Unknown   Pregnant No      09/04/19 2055          Vitals/Pain Today's Vitals   09/04/19 2009 09/04/19 2015 09/04/19 2032 09/04/19 2130  BP:  126/84  113/74  Pulse: (!) 113  (!) 104 (!) 105  Resp:   19 20  Temp:      TempSrc:      SpO2: 100%  100% 100%  PainSc:        Isolation Precautions No active isolations  Medications Medications  furosemide (LASIX) tablet 40 mg (has no administration in time range)  donepezil (ARICEPT) tablet 10 mg (has no administration in time range)  nortriptyline (PAMELOR) capsule 10 mg (has no administration in time range)  cyclobenzaprine (FLEXERIL) tablet 10 mg (has no administration in time range)  0.9 %  sodium chloride infusion (has no administration in time range)  acetaminophen (TYLENOL) tablet 650 mg (has no administration in time range)    Or  acetaminophen (TYLENOL) suppository 650 mg (has no administration in time range)  pantoprazole (PROTONIX) 80 mg in sodium chloride 0.9 % 100 mL IVPB (has no administration in time range)  pantoprazole (PROTONIX) 80 mg in sodium chloride 0.9 % 250 mL (0.32 mg/mL) infusion (has no administration in time range)  pantoprazole (PROTONIX) injection 40 mg (has no administration in time range)  sodium chloride 0.9 % bolus 1,000 mL (0 mLs Intravenous Stopped 09/04/19 2129)  pantoprazole (PROTONIX) injection 40 mg (40 mg Intravenous Given 09/04/19 2009)  sodium chloride 0.9 % bolus 1,000 mL (1,000 mLs Intravenous Bolus from Bag 09/04/19 2129)    Mobility walks with device Low fall risk     R  Recommendations: See Admitting Provider Note  Report given to:  Darol Destine 2W

## 2019-09-04 NOTE — H&P (Signed)
History and Physical    Julia Robbins DOB: 11-24-1922 DOA: 09/04/2019  PCP: Merrilee Seashore, MD (Confirm with patient/family/NH records and if not entered, this has to be entered at [facility] point of entry) Patient coming from: Patient is coming from independent senior apartment  I have personally briefly reviewed patient's old medical records in [place]  Chief Complaint: Blood on undergarments, toilet bowel and report of vomiting blood  HPI: Julia Robbins is a 85 y.o. female with medical history significant of h/o colon cancer s/p colectomy and chemo in 1998 with several subsequent colonoscopies negative for recurrence, most recently 2018 by Dr. Ardis Hughs, [facility]. Patient has been c/o RUQ abdominal pain for several weeks. Daughter went to see her today and found BRB on garments and in toilet bowel. Patient reports throwing up dark coffee ground material. [facility] is [facility] for probable Lower GI bleed and possible hematemesis. Patient does have a h/o diverticular bleed in the past but no h/o bleeding ulcer.(For level 3, the HPI must include 4+ descriptors: Location, Quality, Severity, Duration, Timing, Context, modifying factors, associated signs/symptoms and/or status of 3+ chronic problems.)  (Please avoid self-populating past medical history here) (The initial 2-3 lines should be focused and good to copy and paste in the HPI section of the daily progress note).  ED Course: Patient was hypotensive on arrival to ED. She was administered IV Fluids with a good response. Initial lab revealed Hgb of 11g, Cr 1.96 c/w volume depletion. CT abd/pelvis revealed diverticulosis and cholelithiasis otherwise negative. ED-MD consulted GI on call who recommend admission to step-down and CTA. Consultant will see patient in AM. TR[facility] called to admit patient.   Review of Systems: As per HPI otherwise 10 point review of systems negative.    Past Medical History:  Diagnosis Date  .  Abnormal EKG    hx left bundle branch block dating back to 2012 on old ekg's  . Arthritis    degenerative.   . Cholelithiasis 2010   15 mm CBD on CT scan 2010.   Marland Kitchen Colon cancer ([place]) 1998   chemo and colectomy.  T3 N1 stage 3, 2/15 nodes +.   . DVT (deep venous thrombosis) (HCC)    right leg  . Elevated cholesterol   . Family history of adverse reaction to anesthesia    daughter has post op n/v  . Gastric ulcer 1998  . Greenfield filter in place 1998   for DVT. right leg  . HOH (hard of hearing)    slight    Past Surgical History:  Procedure Laterality Date  . COLON SURGERY  1998  . COLONOSCOPY WITH PROPOFOL N/A 04/12/2017   Procedure: COLONOSCOPY WITH PROPOFOL;  Surgeon: Milus Banister, MD;  [facility];  Service: Endoscopy;  Laterality: N/A;  . DILATION AND CURETTAGE OF UTERUS  yrs ago  . ESOPHAGOGASTRODUODENOSCOPY (EGD) WITH PROPOFOL N/A 03/08/2017   Procedure: ESOPHAGOGASTRODUODENOSCOPY (EGD) WITH PROPOFOL;  Surgeon: Milus Banister, MD;  [facility];  Service: Endoscopy;  Laterality: N/A;   Soc Hx  - lives independently in senior living apartment. Daughter sees her daily. She is a widow. She worked in house-keeping. She has one daughter, 2 grands and 4 great-grands. She has been isolated during covid pandemic and daughter has noted some mild memory impairment as a consequence.    reports that she has never smoked. She has never used smokeless tobacco. She reports that she does not drink alcohol or use drugs.  No Known Allergies  Family History  Problem Relation Age of Onset  . Heart disease Mother   . Colon cancer Neg Hx   . Stomach cancer Neg Hx   . Esophageal cancer Neg Hx   . Pancreatic cancer Neg Hx   . Liver disease Neg Hx    Unacceptable: Noncontributory, unremarkable, or negative. Acceptable: Family history reviewed and not pertinent (If you reviewed it)  Prior to Admission medications   Medication Sig Start Date End Date Taking?  Authorizing Provider  acetaminophen (TYLENOL) 500 MG tablet Take 1,000 mg by mouth 2 (two) times daily as needed for moderate pain or headache.   Yes [provider]  Cholecalciferol (VITAMIN D-3) 1000 units CAPS Take 1 tablet by mouth daily.   Yes [provider]  cyclobenzaprine (FLEXERIL) 10 MG tablet Take 10 mg by mouth 2 (two) times daily as needed for muscle spasms.    Yes [provider]  donepezil (ARICEPT) 10 MG tablet Take 10 mg by mouth at bedtime.   Yes [provider]  ferrous sulfate 325 (65 FE) MG tablet Take 325 mg by mouth daily with breakfast.    Yes [provider]  furosemide (LASIX) 40 MG tablet Take 40 mg by mouth daily.    Yes [provider]  Multiple Vitamin (MULTIVITAMIN) capsule Take 1 capsule by mouth daily.   Yes [provider]  nortriptyline (PAMELOR) 10 MG capsule Take 10 mg by mouth 2 (two) times daily.   Yes [provider]  potassium chloride SA (K-DUR,KLOR-CON) 20 MEQ tablet Take 20 mEq by mouth 2 (two) times daily.    Yes [provider]  hyoscyamine (LEVSIN SL) 0.125 MG SL tablet Place 1 tablet (0.125 mg total) under the tongue every 4 (four) hours as needed. Patient not taking: Reported on 09/04/2019 04/12/17   Milus Banister, MD  oxyCODONE-acetaminophen (PERCOCET) 5-325 MG per tablet Take 2 tablets by mouth every 4 (four) hours as needed for pain. Patient not taking: Reported on 09/04/2019 11/30/12   Drenda Freeze, MD    Physical Exam: Vitals:   09/04/19 2009 09/04/19 2015 09/04/19 2032 09/04/19 2130  BP:  126/84  113/74  Pulse: (!) 113  (!) 104 (!) 105  Resp:   19 20  Temp:      TempSrc:      SpO2: 100%  100% 100%    Constitutional: NAD, calm, comfortable Vitals:   09/04/19 2009 09/04/19 2015 09/04/19 2032 09/04/19 2130  BP:  126/84  113/74  Pulse: (!) 113  (!) 104 (!) 105  Resp:   19 20  Temp:      TempSrc:      SpO2: 100%  100% 100%   General appearance:   Wizened elderly woman in no distress. Awake and cooperative. Eyes: PERRL, lids and conjunctivae normal ENMT: Mucous membranes are moist. Posterior pharynx clear of any exudate or lesions.Edentulous Neck: normal, supple, no masses, no thyromegaly Respiratory: clear to auscultation bilaterally, no wheezing, no crackles. Normal respiratory effort. No accessory muscle use.  Cardiovascular: Regular rate and rhythm, no murmurs / rubs / gallops. No extremity edema. 2+ pedal pulses. No carotid bruits.  Abdomen: hypoactive BS. Tender to palpation RUQ. No tenderness lower quadrants. Rectal - deferred. Musculoskeletal: no clubbing / cyanosis. No joint deformity upper and lower extremities. Good ROM, no contractures. Decreased  muscle tone.  Skin: no rashes, lesions, ulcers. No induration Neurologic: CN 2-12 grossly intact.   Psychiatric: Normal judgment and insight. Alert  and oriented x 3. Normal mood.   (Anything < 9 systems with 2 bullets each down codes to level 1) (If patient refuses exam can't bill higher level) (Make sure to document decubitus ulcers present on admission -- if possible -- and whether patient has chronic indwelling catheter at time of admission)  Labs on Admission: I have personally reviewed following labs and imaging studies  CBC: Recent Labs  Lab 09/04/19 1928  WBC 11.0*  HGB 11.0*  HCT 33.3*  MCV 95.7  PLT [id number]   Basic Metabolic Panel: Recent Labs  Lab 09/04/19 1928  NA 140  K 4.6  CL 105  CO2 22  GLUCOSE 126*  BUN 55*  CREATININE 1.76*  CALCIUM 9.3   GFR: CrCl cannot be calculated (Unknown ideal weight.). Liver Function Tests: Recent Labs  Lab 09/04/19 1928  AST 21  ALT 13  ALKPHOS 43  BILITOT 0.7  PROT 7.4  ALBUMIN 3.4*   No results for input(s): LIPASE, AMYLASE in the last 168 hours. No results for input(s): AMMONIA in the last 168 hours. Coagulation Profile: Recent Labs  Lab 09/04/19 1928  INR 1.2   Cardiac Enzymes: No results for  input(s): CKTOTAL, CKMB, CKMBINDEX, TROPONINI in the last 168 hours. BNP (last 3 results) No results for input(s): PROBNP in the last 8760 hours. HbA1C: No results for input(s): HGBA1C in the last 72 hours. CBG: No results for input(s): GLUCAP in the last 168 hours. Lipid Profile: No results for input(s): CHOL, HDL, LDLCALC, TRIG, CHOLHDL, LDLDIRECT in the last 72 hours. Thyroid Function Tests: No results for input(s): TSH, T4TOTAL, FREET4, T3FREE, THYROIDAB in the last 72 hours. Anemia Panel: No results for input(s): VITAMINB12, FOLATE, FERRITIN, TIBC, IRON, RETICCTPCT in the last 72 hours. Urine analysis:    Component Value Date/Time   COLORURINE AMBER BIOCHEMICALS MAY BE AFFECTED BY COLOR (A) 10/04/2008 1330   APPEARANCEUR CLEAR 10/04/2008 1330   LABSPEC 1.038 (H) 10/04/2008 1330   PHURINE 5.0 10/04/2008 1330   GLUCOSEU NEGATIVE 10/04/2008 1330   HGBUR NEGATIVE 10/04/2008 1330   BILIRUBINUR SMALL (A) 10/04/2008 1330   KETONESUR 15 (A) 10/04/2008 1330   PROTEINUR 30 (A) 10/04/2008 1330   UROBILINOGEN 0.2 10/04/2008 1330   NITRITE NEGATIVE 10/04/2008 1330   LEUKOCYTESUR NEGATIVE 10/04/2008 1330    Radiological Exams on Admission: Ct Abdomen Pelvis Wo Contrast  Result Date: 09/04/2019 CLINICAL DATA:  Right lower quadrant pain. Right red blood per rectum. EXAM: CT ABDOMEN AND PELVIS WITHOUT CONTRAST TECHNIQUE: Multidetector CT imaging of the abdomen and pelvis was performed following the standard protocol without IV contrast. COMPARISON:  02/14/2017 FINDINGS: Lower chest: Scarring/fibrosis in the lung bases. No acute abnormality. Hepatobiliary: Gallstones fill the gallbladder. No focal hepatic abnormality. Pancreas: No focal abnormality or ductal dilatation. Spleen: No focal abnormality.  Normal size. Adrenals/Urinary Tract: No adrenal abnormality. No focal renal abnormality. No stones or hydronephrosis. Urinary bladder is unremarkable. Stomach/Bowel: Sigmoid diverticulosis. No active  diverticulitis. Stomach and small bowel decompressed. No bowel obstruction. Vascular/Lymphatic: Aortic atherosclerosis. No enlarged abdominal or pelvic lymph nodes. IVC filter in place. Reproductive: Uterus and adnexa unremarkable.  No mass. Other: No free fluid or free air. Musculoskeletal: No acute bony abnormality. IMPRESSION: Sigmoid diverticulosis.  No active diverticulitis. Cholelithiasis. Aortic atherosclerosis. No acute findings. Electronically Signed   By: Rolm Baptise M.D.   On: 09/04/2019 20:43    EKG: Independently reviewed. No EKG on chart  Assessment/Plan Active Problems:   Acute lower GI bleeding   Hematemesis   Cholelithiasis  Acute kidney injury ([place])   Essential hypertension   CKD (chronic kidney disease) stage 3, GFR 30-59 ml/min   Hyperlipidemia  (please populate well all problems here in Problem List. (For example, if patient is on BP meds at home and you resume or decide to hold them, it is a problem that needs to be her. Same for CAD, COPD, HLD and so on)   1. Lower GI Bleed - suspect diverticular bleed over the past several days leading to volume depletion and hypotension. Good response to fluid resuscitation in ED. Suspect Hgb will drop Plan Admit to step-down  After 2nd liter of IVF bolus will hydrate at 50 cc/hr NS  F/u CBC in AM  GI to see in AM - candidate for colonoscopy (?)  2. Upper GI bleed - patient reports hemetemasis - very dark emesis. Plan As #1  IV protonix infusion  GI to see in AM - candidate for EGD (?)  3. RUQ pain - Cholelithiasis on CT scan - full gallbladder. Very tender in RUQ. No evidence of acute infection based on lab Plan GGT, Lipase added to lab  Abd U/S RUQ complete imaging evaluation for cholecystitis  4. AKI - last Cr 0.89 in 2017, now 1.96, most likely prerenal. Plan IV hydration  Hold potassium  F/u Bmet in AM  5. HTN - will hold meds tonight but resume home meds in AM    DVT prophylaxis: SCD's  (Lovenox/Heparin/SCD's/anticoagulated/None (if comfort care) Code Status: full code (Full/Partial (specify details) Family Communication: daughter present during interview and exam. Agrees with treatment plan (Specify name, relationship. Do not write "discussed with patient". Specify tel # if discussed over the phone) Disposition Plan: return to senior living when medically stable (specify when and where you expect patient to be discharged) Consults called: GI- consult called by EDP (with names) Admission status: inpatient (inpatient / obs / tele / medical floor / SDU)   Adella Hare MD [facility] Pager [phone]  If 7PM-7AM, please contact night-coverage [email or web address] Password [facility]  09/04/2019, 9:53 PM

## 2019-09-04 NOTE — ED Notes (Signed)
Admitting Provider at bedside. 

## 2019-09-04 NOTE — ED Triage Notes (Signed)
Per EMS: Pt from home.  Daughter came to check on pt and found blood on her pants, brief, toilet.  Pt c/o of lower abdominal pain.  Pt reports blood in vomit.  Pt hx dementia. 116/74 Pulse 70 145 CBG

## 2019-09-05 ENCOUNTER — Inpatient Hospital Stay (HOSPITAL_COMMUNITY): Payer: Medicare Other

## 2019-09-05 ENCOUNTER — Inpatient Hospital Stay (HOSPITAL_COMMUNITY): Payer: Medicare Other | Admitting: Anesthesiology

## 2019-09-05 ENCOUNTER — Encounter (HOSPITAL_COMMUNITY): Payer: Self-pay

## 2019-09-05 ENCOUNTER — Encounter (HOSPITAL_COMMUNITY)
Admission: EM | Disposition: A | Discharge status: Home Health | Payer: Self-pay | Source: Home / Self Care | Attending: Internal Medicine

## 2019-09-05 DIAGNOSIS — E785 Hyperlipidemia, unspecified: Secondary | ICD-10-CM

## 2019-09-05 DIAGNOSIS — K8020 Calculus of gallbladder without cholecystitis without obstruction: Secondary | ICD-10-CM

## 2019-09-05 DIAGNOSIS — K920 Hematemesis: Secondary | ICD-10-CM

## 2019-09-05 DIAGNOSIS — K5710 Diverticulosis of small intestine without perforation or abscess without bleeding: Secondary | ICD-10-CM

## 2019-09-05 DIAGNOSIS — D490 Neoplasm of unspecified behavior of digestive system: Secondary | ICD-10-CM

## 2019-09-05 HISTORY — PX: ESOPHAGOGASTRODUODENOSCOPY (EGD) WITH PROPOFOL: SHX5813

## 2019-09-05 LAB — BASIC METABOLIC PANEL
Anion gap: 13 (ref 5–15)
BUN: 52 mg/dL — ABNORMAL HIGH (ref 8–23)
CO2: 21 mmol/L — ABNORMAL LOW (ref 22–32)
Calcium: 8.5 mg/dL — ABNORMAL LOW (ref 8.9–10.3)
Chloride: 108 mmol/L (ref 98–111)
Creatinine, Ser: 1.34 mg/dL — ABNORMAL HIGH (ref 0.44–1.00)
GFR calc Af Amer: 39 mL/min — ABNORMAL LOW (ref >60)
GFR calc non Af Amer: 34 mL/min — ABNORMAL LOW (ref >60)
Glucose, Bld: 105 mg/dL — ABNORMAL HIGH (ref 70–99)
Potassium: 3.8 mmol/L (ref 3.5–5.1)
Sodium: 142 mmol/L (ref 135–145)

## 2019-09-05 LAB — CBC
HCT: 31.8 % — ABNORMAL LOW (ref 36.0–46.0)
Hemoglobin: 10.1 g/dL — ABNORMAL LOW (ref 12.0–15.0)
MCH: 31.1 pg (ref 26.0–34.0)
MCHC: 31.8 g/dL (ref 30.0–36.0)
MCV: 97.8 fL (ref 80.0–100.0)
Platelets: 186 10*3/uL (ref 150–400)
RBC: 3.25 MIL/uL — ABNORMAL LOW (ref 3.87–5.11)
RDW: 12.7 % (ref 11.5–15.5)
WBC: 11.3 10*3/uL — ABNORMAL HIGH (ref 4.0–10.5)
nRBC: 0 % (ref 0.0–0.2)

## 2019-09-05 LAB — HEPATIC FUNCTION PANEL
ALT: 12 U/L (ref 0–44)
AST: 16 U/L (ref 15–41)
Albumin: 3.2 g/dL — ABNORMAL LOW (ref 3.5–5.0)
Alkaline Phosphatase: 39 U/L (ref 38–126)
Bilirubin, Direct: 0.1 mg/dL (ref 0.0–0.2)
Indirect Bilirubin: 0.2 mg/dL — ABNORMAL LOW (ref 0.3–0.9)
Total Bilirubin: 0.3 mg/dL (ref 0.3–1.2)
Total Protein: 6.7 g/dL (ref 6.5–8.1)

## 2019-09-05 LAB — HEMOGLOBIN: Hemoglobin: 9 g/dL — ABNORMAL LOW (ref 12.0–15.0)

## 2019-09-05 LAB — SARS CORONAVIRUS 2 (TAT 6-24 HRS): SARS Coronavirus 2: NEGATIVE

## 2019-09-05 SURGERY — ESOPHAGOGASTRODUODENOSCOPY (EGD) WITH PROPOFOL
Anesthesia: Monitor Anesthesia Care

## 2019-09-05 MED ORDER — LACTATED RINGERS IV SOLN
INTRAVENOUS | Status: DC
  Administered 2019-09-05: 1000 mL via INTRAVENOUS
  Administered 2019-09-06 – 2019-09-08 (×2): via INTRAVENOUS

## 2019-09-05 MED ORDER — ONDANSETRON HCL 4 MG/2ML IJ SOLN
INTRAMUSCULAR | Status: DC | PRN
  Administered 2019-09-05: 4 mg via INTRAVENOUS

## 2019-09-05 MED ORDER — ONDANSETRON HCL 4 MG/2ML IJ SOLN: 4.0000 mg | Freq: Once | INTRAMUSCULAR | Status: DC | PRN

## 2019-09-05 MED ORDER — FENTANYL CITRATE (PF) 100 MCG/2ML IJ SOLN
25.0000 ug | INTRAMUSCULAR | Status: DC | PRN
  Administered 2019-09-06: 50 ug via INTRAVENOUS
  Administered 2019-09-06: 25 ug via INTRAVENOUS
  Filled 2019-09-05 (×2): qty 2

## 2019-09-05 MED ORDER — PROPOFOL 500 MG/50ML IV EMUL
INTRAVENOUS | Status: DC | PRN
  Administered 2019-09-05: 50 ug/kg/min via INTRAVENOUS

## 2019-09-05 MED ORDER — NORTRIPTYLINE HCL 10 MG PO CAPS
10.0000 mg | ORAL_CAPSULE | Freq: Every day | ORAL | Status: DC
  Administered 2019-09-05 – 2019-09-08 (×4): 10 mg via ORAL
  Filled 2019-09-05 (×5): qty 1

## 2019-09-05 MED ORDER — LIDOCAINE 2% (20 MG/ML) 5 ML SYRINGE
INTRAMUSCULAR | Status: DC | PRN
  Administered 2019-09-05: 40 mg via INTRAVENOUS

## 2019-09-05 MED ORDER — PROPOFOL 10 MG/ML IV BOLUS
INTRAVENOUS | Status: DC | PRN
  Administered 2019-09-05 (×2): 10 mg via INTRAVENOUS

## 2019-09-05 MED ORDER — INFLUENZA VAC A&B SA ADJ QUAD 0.5 ML IM PRSY
0.5000 mL | PREFILLED_SYRINGE | INTRAMUSCULAR | Status: DC | PRN
  Filled 2019-09-05: qty 0.5

## 2019-09-05 MED ORDER — SUCRALFATE 1 GM/10ML PO SUSP
1.0000 g | Freq: Three times a day (TID) | ORAL | Status: DC
  Administered 2019-09-05 – 2019-09-09 (×14): 1 g via ORAL
  Filled 2019-09-05 (×13): qty 10

## 2019-09-05 SURGICAL SUPPLY — 15 items

## 2019-09-05 NOTE — Anesthesia Postprocedure Evaluation (Signed)
Anesthesia Post Note  Patient: Julia Robbins  Procedure(s) Performed: ESOPHAGOGASTRODUODENOSCOPY (EGD) WITH PROPOFOL (N/A )     Patient location during evaluation: PACU Anesthesia Type: MAC Level of consciousness: awake and alert Pain management: pain level controlled Vital Signs Assessment: post-procedure vital signs reviewed and stable Respiratory status: spontaneous breathing, nonlabored ventilation, respiratory function stable and patient connected to nasal cannula oxygen Cardiovascular status: stable and blood pressure returned to baseline Postop Assessment: no apparent nausea or vomiting Anesthetic complications: no    Last Vitals:  Vitals:   09/05/19 1520 09/05/19 1530  BP: (!) 94/49 (!) 98/46  Pulse: (!) 105 96  Resp: (!) 26 (!) 28  Temp: 36.6 C   SpO2: 100% 100%    Last Pain:  Vitals:   09/05/19 1520  TempSrc: Axillary  PainSc:                  Catalina Gravel

## 2019-09-05 NOTE — Consult Note (Addendum)
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 Havery Moros, [facility]  HPI: Julia Robbins is an 85 y.o. 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  She was unsure how he times had occurred.  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 Ardis Hughs.  She did have a colonoscopy that time and was found to have a very small amount of colon remaining.  She is undergoing work-up here.  Her hemoglobin on admission was 11.  She did have a lactic acidosis.  She was admitted to the ICU and supportive/resuscitative efforts were made.  She improved.  She was taken to the GI lab with Dr. Havery Moros today and underwent EGD.  This demonstrated a small ulcerated/centrally umbilicated mass in the cardia of the stomach with no active bleeding.  This was felt to be likely source of her GI bleed.  She did also have nonbleeding Cameron's ulcers associated with a 5 cm hiatal hernia.  We were subsequently consulted for further evaluation of possible surgery.  Currently, she reports she feels fine.  She denies any abdominal pain, nausea/vomiting, or any bowel movement since endoscopy.  Past Medical History:  Diagnosis Date  . Abnormal EKG    hx left bundle branch block dating back to 2012 on old ekg's  . Arthritis    degenerative.   . Cholelithiasis 2010   15 mm CBD on CT scan 2010.   Marland [place] Colon cancer ([place]) 1998   chemo and colectomy.  T3 N1 stage 3, 2/15 nodes +.   . DVT (deep venous thrombosis) (HCC)    right leg  . Elevated cholesterol   . Family history of adverse reaction to anesthesia    daughter has post op n/v  . Gastric ulcer 1998  .  [place] filter in place 1998   for DVT. right leg  . HOH (hard of hearing)    slight    Past Surgical History:  Procedure Laterality Date  . COLON SURGERY  1998  . COLONOSCOPY WITH PROPOFOL N/A 04/12/2017   Procedure: COLONOSCOPY WITH PROPOFOL;  Surgeon: Milus Banister, MD;  Location: [facility];  Service: Endoscopy;  Laterality: N/A;  . DILATION AND CURETTAGE OF UTERUS  yrs ago  . ESOPHAGOGASTRODUODENOSCOPY (EGD) WITH PROPOFOL N/A 03/08/2017   Procedure: ESOPHAGOGASTRODUODENOSCOPY (EGD) WITH PROPOFOL;  Surgeon: Milus Banister, MD;  Location: [facility];  Service: Endoscopy;  Laterality: N/A;    Family History  Problem Relation Age of Onset  . Heart disease Mother   . Colon cancer Neg Hx   . Stomach cancer Neg Hx   . Esophageal cancer Neg Hx   . Pancreatic cancer Neg Hx   . Liver disease Neg Hx     Social:  reports that she has never smoked. She has never used smokeless tobacco. She reports that she does not drink alcohol or use drugs.  Allergies: No Known Allergies  Medications: I have reviewed the patient's current medications.  Results for orders placed or performed during the hospital encounter of 09/04/19 (from the past 48 hour(s))  Type and screen [facility]  Status: None   Collection Time: 09/04/19  6:55 PM  Result Value Ref Range   ABO/RH(D) B POS    Antibody Screen NEG    Sample Expiration      09/07/2019,2359 Performed at [facility], [place] [address], [place], [place] [address]   CBC     Status: Abnormal   Collection Time: 09/04/19  7:28 PM  Result Value Ref Range   WBC 11.0 (H) 4.0 - 10.5 K/uL   RBC 3.48 (L) 3.87 - 5.11 MIL/uL   Hemoglobin 11.0 (L) 12.0 - 15.0 g/dL   HCT 33.3 (L) 36.0 - 46.0 %   MCV 95.7 80.0 - 100.0 fL   MCH 31.6 26.0 - 34.0 pg   MCHC 33.0 30.0 - 36.0 g/dL   RDW 12.6 11.5 - 15.5 %   Platelets 227 150 - 400 K/uL   nRBC 0.0 0.0 - 0.2 %    Comment: Performed at [facility], [place] [address] [place], [place] [address]  CMP     Status: Abnormal   Collection Time: 09/04/19  7:28 PM  Result Value Ref Range   Sodium 140 135 - 145 mmol/L   Potassium 4.6 3.5 - 5.1 mmol/L   Chloride 105 98 - 111 mmol/L   CO2 22 22 - 32 mmol/L   Glucose, Bld 126 (H) 70 - 99 mg/dL   BUN 55 (H) 8 - 23 mg/dL   Creatinine, Ser 1.76 (H) 0.44 - 1.00 mg/dL   Calcium 9.3 8.9 - 10.3 mg/dL   Total Protein 7.4 6.5 - 8.1 g/dL   Albumin 3.4 (L) 3.5 - 5.0 g/dL   AST 21 15 - 41 U/L   ALT 13 0 - 44 U/L   Alkaline Phosphatase 43 38 - 126 U/L   Total Bilirubin 0.7 0.3 - 1.2 mg/dL   GFR calc non Af Amer 24 (L) >60 mL/min   GFR calc Af Amer 28 (L) >60 mL/min   Anion gap 13 5 - 15    Comment: Performed at [facility], [place] [address], [place], [place] [address]  PT     Status: None   Collection Time: 09/04/19  7:28 PM  Result Value Ref Range   Prothrombin Time 14.8 11.4 - 15.2 seconds   INR 1.2 0.8 - 1.2    Comment: (NOTE) INR goal varies based on device and disease states. Performed at [facility], [place] [address] [place], [place] [address]   PTT     Status: None   Collection Time: 09/04/19  7:28 PM  Result Value Ref Range   aPTT 33 24 - 36 seconds    Comment: Performed at [facility], [place] [address] [place], [place] [address]  Lactic acid, plasma     Status: Abnormal   Collection Time: 09/04/19  7:57 PM  Result Value Ref Range   Lactic Acid, Venous 4.0 (HH) 0.5 - 1.9 mmol/L    Comment: CRITICAL RESULT CALLED TO, READ BACK BY AND VERIFIED WITH: RN B BROOKS AT 2045 09/04/19 CRUICKSHANK A Performed at [facility], [place] [address], [place], [place] [address]   SARS CORONAVIRUS 2 (TAT 6-24 HRS) Nasopharyngeal Nasopharyngeal Swab     Status: None   Collection Time: 09/04/19  9:32 PM   Specimen: Nasopharyngeal Swab  Result Value Ref Range   SARS Coronavirus 2 NEGATIVE NEGATIVE    Comment:  (NOTE) SARS-CoV-2 target nucleic acids are NOT DETECTED. The SARS-CoV-2 RNA  is generally detectable in upper and lower respiratory specimens during the acute phase of infection. Negative results do not preclude SARS-CoV-2 infection, do not rule out co-infections with other pathogens, and should not be used as the sole basis for treatment or other patient management decisions. Negative results must be combined with clinical observations, patient history, and epidemiological information. The expected result is Negative. Fact Sheet for Patients: SugarRoll.be Fact Sheet for Healthcare Providers: https://www.woods-mathews.com/ This test is not yet approved or cleared by the [place] FDA and  has been authorized for detection and/or diagnosis of SARS-CoV-2 by FDA under an Emergency Use Authorization (EUA). This EUA will remain  in effect (meaning this test can be used) for the duration of the COVID-19 declaration under Section 56 4(b)(1) of the Act, 21 U.S.C. section 360bbb-3(b)(1), unless the authorization is terminated or revoked sooner. Performed at [facility], [place] [address] [place], [place] [address]   MRSA PCR Screening     Status: None   Collection Time: 09/04/19 10:06 PM   Specimen: Nasopharyngeal  Result Value Ref Range   MRSA by PCR NEGATIVE NEGATIVE    Comment:        The GeneXpert MRSA Assay (FDA approved for NASAL specimens only), is one component of a comprehensive MRSA colonization surveillance program. It is not intended to diagnose MRSA infection nor to guide or monitor treatment for MRSA infections. Performed at [facility], [place] [address], [place], [place] [address]   Lactic acid, plasma     Status: Abnormal   Collection Time: 09/04/19 10:28 PM  Result Value Ref Range   Lactic Acid, Venous 4.7 (HH) 0.5 - 1.9 mmol/L    Comment: CRITICAL VALUE NOTED.  VALUE IS CONSISTENT WITH PREVIOUSLY  REPORTED AND CALLED VALUE. Performed at [facility], [place] [address], [place], [place] [address]   Gamma GT     Status: None   Collection Time: 09/04/19 10:28 PM  Result Value Ref Range   GGT 16 7 - 50 U/L    Comment: Performed at [facility], [place] [address], [place], [place] [address]  Lipase, blood     Status: None   Collection Time: 09/04/19 10:28 PM  Result Value Ref Range   Lipase 21 11 - 51 U/L    Comment: Performed at [facility], [place] [address] [place], [place] [address]  CBC     Status: Abnormal   Collection Time: 09/05/19  2:25 AM  Result Value Ref Range   WBC 11.3 (H) 4.0 - 10.5 K/uL   RBC 3.25 (L) 3.87 - 5.11 MIL/uL   Hemoglobin 10.1 (L) 12.0 - 15.0 g/dL   HCT 31.8 (L) 36.0 - 46.0 %   MCV 97.8 80.0 - 100.0 fL   MCH 31.1 26.0 - 34.0 pg   MCHC 31.8 30.0 - 36.0 g/dL   RDW 12.7 11.5 - 15.5 %   Platelets 186 150 - 400 K/uL   nRBC 0.0 0.0 - 0.2 %    Comment: Performed at [facility], [place] [address] [place],  [id number]  Basic metabolic panel     Status: Abnormal   Collection Time: 09/05/19  2:25 AM  Result Value Ref Range   Sodium 142 135 - 145 mmol/L   Potassium 3.8 3.5 - 5.1 mmol/L    Comment: DELTA CHECK NOTED   Chloride 108 98 - 111 mmol/L   CO2 21 (L) 22 - 32 mmol/L   Glucose,  Bld 105 (H) 70 - 99 mg/dL   BUN 52 (H) 8 - 23 mg/dL   Creatinine, Ser 1.34 (H) 0.44 - 1.00 mg/dL   Calcium 8.5 (L) 8.9 - 10.3 mg/dL   GFR calc non Af Amer 34 (L) >60 mL/min   GFR calc Af Amer 39 (L) >60 mL/min   Anion gap 13 5 - 15    Comment: Performed at [facility], [place] [address] [place], [place] [address]  Hepatic function panel     Status: Abnormal   Collection Time: 09/05/19  2:25 AM  Result Value Ref Range   Total Protein 6.7 6.5 - 8.1 g/dL   Albumin 3.2 (L) 3.5 - 5.0 g/dL   AST 16 15 - 41 U/L   ALT 12 0 - 44 U/L   Alkaline Phosphatase 39 38 - 126 U/L   Total Bilirubin  0.3 0.3 - 1.2 mg/dL   Bilirubin, Direct 0.1 0.0 - 0.2 mg/dL   Indirect Bilirubin 0.2 (L) 0.3 - 0.9 mg/dL    Comment: Performed at [facility], [place] [address] [place], [place] [address]  Hemoglobin     Status: Abnormal   Collection Time: 09/05/19 12:04 PM  Result Value Ref Range   Hemoglobin 9.0 (L) 12.0 - 15.0 g/dL    Comment: Performed at [facility], [place] [address], [place], [place] [address]    Ct Abdomen Pelvis Wo Contrast  Result Date: 09/04/2019 CLINICAL DATA:  Right lower quadrant pain. Right red blood per rectum. EXAM: CT ABDOMEN AND PELVIS WITHOUT CONTRAST TECHNIQUE: Multidetector CT imaging of the abdomen and pelvis was performed following the standard protocol without IV contrast. COMPARISON:  02/14/2017 FINDINGS: Lower chest: Scarring/fibrosis in the lung bases. No acute abnormality. Hepatobiliary: Gallstones fill the gallbladder. No focal hepatic abnormality. Pancreas: No focal abnormality or ductal dilatation. Spleen: No focal abnormality.  Normal size. Adrenals/Urinary Tract: No adrenal abnormality. No focal renal abnormality. No stones or hydronephrosis. Urinary bladder is unremarkable. Stomach/Bowel: Sigmoid diverticulosis. No active diverticulitis. Stomach and small bowel decompressed. No bowel obstruction. Vascular/Lymphatic: Aortic atherosclerosis. No enlarged abdominal or pelvic lymph nodes. IVC filter in place. Reproductive: Uterus and adnexa unremarkable.  No mass. Other: No free fluid or free air. Musculoskeletal: No acute bony abnormality. IMPRESSION: Sigmoid diverticulosis.  No active diverticulitis. Cholelithiasis. Aortic atherosclerosis. No acute findings. Electronically Signed   By: Rolm Baptise M.D.   On: 09/04/2019 20:43   US Abdomen Limited  Result Date: 09/05/2019 CLINICAL DATA:  Right upper quadrant abdominal pain for 1 day. EXAM: ULTRASOUND ABDOMEN LIMITED RIGHT UPPER QUADRANT COMPARISON:  CT of the abdomen and pelvis  without contrast 09/04/2019 FINDINGS: Gallbladder: Wall echo complex is present with multiple known large stones. Gallbladder wall thickness is within normal limits at 1.9 mm. There is no sonographic Murphy's sign. Common bile duct: Diameter: The common bile duct is markedly dilated, measuring 17.5 cm. No obstructing mass lesion is present. Liver: Rolled the liver is diffusely echogenic. No focal lesions are present. Portal vein is patent on color Doppler imaging with normal direction of blood flow towards the liver. Other: None. IMPRESSION: 1. Dilated common bile duct without obstructing mass lesion. No intrahepatic biliary dilation is evident. 2. Large gallstones without evidence for acute cholecystitis 3. Hepatic steatosis.  No discrete hepatic lesions. Electronically Signed   By: San Morelle M.D.   On: 09/05/2019 06:02    ROS - all of the below systems have been reviewed with the patient and positives are  indicated with bold text General: chills, fever or night sweats Eyes: blurry vision or double vision ENT: epistaxis or sore throat Allergy/Immunology: itchy/watery eyes or nasal congestion Hematologic/Lymphatic: bleeding problems, blood clots or swollen lymph nodes Endocrine: temperature intolerance or unexpected weight changes Breast: new or changing breast lumps or nipple discharge Resp: cough, shortness of breath, or wheezing CV: chest pain or dyspnea on exertion GI: as per HPI GU: dysuria, trouble voiding, or hematuria MSK: joint pain or joint stiffness Neuro: TIA or stroke symptoms Derm: pruritus and skin lesion changes Psych: anxiety and depression  PE Blood pressure 125/79, pulse (!) 102, temperature 97.9 F (36.6 C), temperature source Axillary, resp. rate (!) 23, height 5\' 5"  (1.651 m), weight 69 kg, SpO2 100 %. Constitutional: NAD; conversant; no deformities Eyes: Moist conjunctiva; no lid lag; anicteric; Pupils equal and round Neck: Trachea midline; no  thyromegaly Lungs: Normal respiratory effort; no tactile fremitus CV: RRR; no palpable thrills; no significant pitting edema GI: Abd soft, NT/ND; no palpable hepatosplenomegaly MSK: no clubbing/cyanosis Psychiatric: Appropriate affect; alert and oriented x3 Lymphatic: No palpable cervical or axillary lymphadenopathy  Results for orders placed or performed during the hospital encounter of 09/04/19 (from the past 48 hour(s))  Type and screen [place]     Status: None   Collection Time: 09/04/19  6:55 PM  Result Value Ref Range   ABO/RH(D) B POS    Antibody Screen NEG    Sample Expiration      09/07/2019,2359 Performed at [facility], [place] [address] [place], [place] [address]   CBC     Status: Abnormal   Collection Time: 09/04/19  7:28 PM  Result Value Ref Range   WBC 11.0 (H) 4.0 - 10.5 K/uL   RBC 3.48 (L) 3.87 - 5.11 MIL/uL   Hemoglobin 11.0 (L) 12.0 - 15.0 g/dL   HCT 33.3 (L) 36.0 - 46.0 %   MCV 95.7 80.0 - 100.0 fL   MCH 31.6 26.0 - 34.0 pg   MCHC 33.0 30.0 - 36.0 g/dL   RDW 12.6 11.5 - 15.5 %   Platelets 227 150 - 400 K/uL   nRBC 0.0 0.0 - 0.2 %    Comment: Performed at [facility], [place] [address], [place], [place] [address]  CMP     Status: Abnormal   Collection Time: 09/04/19  7:28 PM  Result Value Ref Range   Sodium 140 135 - 145 mmol/L   Potassium 4.6 3.5 - 5.1 mmol/L   Chloride 105 98 - 111 mmol/L   CO2 22 22 - 32 mmol/L   Glucose, Bld 126 (H) 70 - 99 mg/dL   BUN 55 (H) 8 - 23 mg/dL   Creatinine, Ser 1.76 (H) 0.44 - 1.00 mg/dL   Calcium 9.3 8.9 - 10.3 mg/dL   Total Protein 7.4 6.5 - 8.1 g/dL   Albumin 3.4 (L) 3.5 - 5.0 g/dL   AST 21 15 - 41 U/L   ALT 13 0 - 44 U/L   Alkaline Phosphatase 43 38 - 126 U/L   Total Bilirubin 0.7 0.3 - 1.2 mg/dL   GFR calc non Af Amer 24 (L) >60 mL/min   GFR calc Af Amer 28 (L) >60 mL/min   Anion gap 13 5 - 15    Comment: Performed at [facility],  [place] [address], [place], [place] [address]  PT     Status: None   Collection Time: 09/04/19  7:28 PM  Result  Value Ref Range   Prothrombin Time 14.8 11.4 - 15.2 seconds   INR 1.2 0.8 - 1.2    Comment: (NOTE) INR goal varies based on device and disease states. Performed at [facility], [place] [address], [place], [place] [address]   PTT     Status: None   Collection Time: 09/04/19  7:28 PM  Result Value Ref Range   aPTT 33 24 - 36 seconds    Comment: Performed at [facility], [place] [address] [place], [place] [address]  Lactic acid, plasma     Status: Abnormal   Collection Time: 09/04/19  7:57 PM  Result Value Ref Range   Lactic Acid, Venous 4.0 (HH) 0.5 - 1.9 mmol/L    Comment: CRITICAL RESULT CALLED TO, READ BACK BY AND VERIFIED WITH: RN B BROOKS AT 2045 09/04/19 CRUICKSHANK A Performed at [facility], [place] [address], [place], [place] [address]   SARS CORONAVIRUS 2 (TAT 6-24 HRS) Nasopharyngeal Nasopharyngeal Swab     Status: None   Collection Time: 09/04/19  9:32 PM   Specimen: Nasopharyngeal Swab  Result Value Ref Range   SARS Coronavirus 2 NEGATIVE NEGATIVE    Comment: (NOTE) SARS-CoV-2 target nucleic acids are NOT DETECTED. The SARS-CoV-2 RNA is generally detectable in upper and lower respiratory specimens during the acute phase of infection. Negative results do not preclude SARS-CoV-2 infection, do not rule out co-infections with other pathogens, and should not be used as the sole basis for treatment or other patient management decisions. Negative results must be combined with clinical observations, patient history, and epidemiological information. The expected result is Negative. Fact Sheet for Patients: SugarRoll.be Fact Sheet for Healthcare Providers: https://www.woods-mathews.com/ This test is not yet approved or cleared by the [place] FDA and  has been  authorized for detection and/or diagnosis of SARS-CoV-2 by FDA under an Emergency Use Authorization (EUA). This EUA will remain  in effect (meaning this test can be used) for the duration of the COVID-19 declaration under Section 56 4(b)(1) of the Act, 21 U.S.C. section 360bbb-3(b)(1), unless the authorization is terminated or revoked sooner. Performed at [facility], [place] [address], [place], [place] [address]   MRSA PCR Screening     Status: None   Collection Time: 09/04/19 10:06 PM   Specimen: Nasopharyngeal  Result Value Ref Range   MRSA by PCR NEGATIVE NEGATIVE    Comment:        The GeneXpert MRSA Assay (FDA approved for NASAL specimens only), is one component of a comprehensive MRSA colonization surveillance program. It is not intended to diagnose MRSA infection nor to guide or monitor treatment for MRSA infections. Performed at [facility], [place] [address], [place], [place] [address]   Lactic acid, plasma     Status: Abnormal   Collection Time: 09/04/19 10:28 PM  Result Value Ref Range   Lactic Acid, Venous 4.7 (HH) 0.5 - 1.9 mmol/L    Comment: CRITICAL VALUE NOTED.  VALUE IS CONSISTENT WITH PREVIOUSLY REPORTED AND CALLED VALUE. Performed at [facility], [place] [address] [place], [place] [address]   Gamma GT     Status: None   Collection Time: 09/04/19 10:28 PM  Result Value Ref Range   GGT 16 7 - 50 U/L    Comment: Performed at [facility], [place] [address], [place], [place] [address]  Lipase, blood     Status: None   Collection Time: 09/04/19 10:28  PM  Result Value Ref Range   Lipase 21 11 - 51 U/L    Comment: Performed at [facility], [place] [address], [place], [place] [address]  CBC     Status: Abnormal   Collection Time: 09/05/19  2:25 AM  Result Value Ref Range   WBC 11.3 (H) 4.0 - 10.5 K/uL   RBC 3.25 (L) 3.87 - 5.11 MIL/uL   Hemoglobin 10.1 (L) 12.0 - 15.0 g/dL   HCT 31.8  (L) 36.0 - 46.0 %   MCV 97.8 80.0 - 100.0 fL   MCH 31.1 26.0 - 34.0 pg   MCHC 31.8 30.0 - 36.0 g/dL   RDW 12.7 11.5 - 15.5 %   Platelets 186 150 - 400 K/uL   nRBC 0.0 0.0 - 0.2 %    Comment: Performed at [facility], [place] [address], [place], [place] [id number]  Basic metabolic panel     Status: Abnormal   Collection Time: 09/05/19  2:25 AM  Result Value Ref Range   Sodium 142 135 - 145 mmol/L   Potassium 3.8 3.5 - 5.1 mmol/L    Comment: DELTA CHECK NOTED   Chloride 108 98 - 111 mmol/L   CO2 21 (L) 22 - 32 mmol/L   Glucose, Bld 105 (H) 70 - 99 mg/dL   BUN 52 (H) 8 - 23 mg/dL   Creatinine, Ser 1.34 (H) 0.44 - 1.00 mg/dL   Calcium 8.5 (L) 8.9 - 10.3 mg/dL   GFR calc non Af Amer 34 (L) >60 mL/min   GFR calc Af Amer 39 (L) >60 mL/min   Anion gap 13 5 - 15    Comment: Performed at [facility], [place] [address], [place], [place] [address]  Hepatic function panel     Status: Abnormal   Collection Time: 09/05/19  2:25 AM  Result Value Ref Range   Total Protein 6.7 6.5 - 8.1 g/dL   Albumin 3.2 (L) 3.5 - 5.0 g/dL   AST 16 15 - 41 U/L   ALT 12 0 - 44 U/L   Alkaline Phosphatase 39 38 - 126 U/L   Total Bilirubin 0.3 0.3 - 1.2 mg/dL   Bilirubin, Direct 0.1 0.0 - 0.2 mg/dL   Indirect Bilirubin 0.2 (L) 0.3 - 0.9 mg/dL    Comment: Performed at [facility], [place] [address], [place], [place] [address]  Hemoglobin     Status: Abnormal   Collection Time: 09/05/19 12:04 PM  Result Value Ref Range   Hemoglobin 9.0 (L) 12.0 - 15.0 g/dL    Comment: Performed at [facility], [place] [address], [place], [place] [address]    Ct Abdomen Pelvis Wo Contrast  Result Date: 09/04/2019 CLINICAL DATA:  Right lower quadrant pain. Right red blood per rectum. EXAM: CT ABDOMEN AND PELVIS WITHOUT CONTRAST TECHNIQUE: Multidetector CT imaging of the abdomen and pelvis was performed following the standard protocol without IV contrast.  COMPARISON:  02/14/2017 FINDINGS: Lower chest: Scarring/fibrosis in the lung bases. No acute abnormality. Hepatobiliary: Gallstones fill the gallbladder. No focal hepatic abnormality. Pancreas: No focal abnormality or ductal dilatation. Spleen: No focal abnormality.  Normal size. Adrenals/Urinary Tract: No adrenal abnormality. No focal renal abnormality. No stones or hydronephrosis. Urinary bladder is unremarkable. Stomach/Bowel: Sigmoid diverticulosis. No active diverticulitis. Stomach and small bowel decompressed. No bowel obstruction. Vascular/Lymphatic: Aortic atherosclerosis. No enlarged abdominal or pelvic lymph nodes. IVC filter in place. Reproductive: Uterus and adnexa unremarkable.  No mass. Other: No  free fluid or free air. Musculoskeletal: No acute bony abnormality. IMPRESSION: Sigmoid diverticulosis.  No active diverticulitis. Cholelithiasis. Aortic atherosclerosis. No acute findings. Electronically Signed   By: Rolm Baptise M.D.   On: 09/04/2019 20:43   US Abdomen Limited  Result Date: 09/05/2019 CLINICAL DATA:  Right upper quadrant abdominal pain for 1 day. EXAM: ULTRASOUND ABDOMEN LIMITED RIGHT UPPER QUADRANT COMPARISON:  CT of the abdomen and pelvis without contrast 09/04/2019 FINDINGS: Gallbladder: Wall echo complex is present with multiple known large stones. Gallbladder wall thickness is within normal limits at 1.9 mm. There is no sonographic Murphy's sign. Common bile duct: Diameter: The common bile duct is markedly dilated, measuring 17.5 cm. No obstructing mass lesion is present. Liver: Rolled the liver is diffusely echogenic. No focal lesions are present. Portal vein is patent on color Doppler imaging with normal direction of blood flow towards the liver. Other: None. IMPRESSION: 1. Dilated common bile duct without obstructing mass lesion. No intrahepatic biliary dilation is evident. 2. Large gallstones without evidence for acute cholecystitis 3. Hepatic steatosis.  No discrete hepatic  lesions. Electronically Signed   By: San Morelle M.D.   On: 09/05/2019 06:02   A/P: Julia Robbins is an 85 y.o. 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 Dema Severin, M.D. [facility] Use [email or web address] to contact on call provider

## 2019-09-05 NOTE — Op Note (Signed)
[facility] Patient Name: Julia Robbins Procedure Date: 09/05/2019 MRN: [id number] Attending MD: Carlota Raspberry. 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 Havery Moros, MD, Cleda Daub, RN, Lazaro Arms, Technician Referring MD:              Medicines:                Monitored Anesthesia Care Complications:            No immediate complications. Estimated blood loss:                            Minimal. Estimated Blood Loss:     Estimated blood loss was minimal. Procedure:                Pre-Anesthesia Assessment:                           - Prior to the procedure, a History and Physical                            was performed, and patient medications and                            allergies were reviewed. The patient's tolerance of                            previous anesthesia was also reviewed. The risks                            and benefits of the procedure and the sedation                            options and risks were discussed with the patient.  All questions were answered, and informed consent                            was obtained. Prior Anticoagulants: The patient has                            taken no previous anticoagulant or antiplatelet                            agents. ASA Grade Assessment: III - A patient with                            severe systemic disease. After reviewing the risks                            and  benefits, the patient was deemed in                            satisfactory condition to undergo the procedure.                           After obtaining informed consent, the endoscope was                            passed under direct vision. Throughout the                            procedure, the patient's blood pressure, pulse, and                            oxygen saturations were monitored continuously. The                            GIF-H190 ([id number] Olympus gastroscope was                            introduced through the mouth, and advanced to the                            second part of duodenum. The upper GI endoscopy was                            accomplished without difficulty. The patient                            tolerated the procedure well. Scope In: Scope Out: Findings:      Esophagogastric landmarks were identified: the Z-line was found at 30       cm, the gastroesophageal junction was found at 30 cm and the upper       extent of the gastric folds was found at 35 cm from the incisors.      A 5 cm hiatal hernia was present, nonbleeding Cameron lesions were noted       in the hiatal hernia.  One benign-appearing nonobstructing Shatski ring was found at the GEJ.      The exam of the esophagus was otherwise normal.      A subepithelial mass with no bleeding was found in the cardia. It was       ulcerated in 2 different locations, with an area of central       umbilication. No active bleeding noted however it was friable. It was a       few cm in size. Almost certainly this is a GIST. Prior biopsies in 2018       negative for carcinoma, were nondiagnostic, and biopsies were       complicated by procedural bleeding. Given recent significant bleeding,       additional biopsies were not obtained.      The exam of the stomach was otherwise normal.      A very large diverticulum was found in the second portion of the       duodenum.      The exam of the duodenum was  otherwise normal. Impression:               - Esophagogastric landmarks identified.                           - 5 cm hiatal hernia with nonbleeding cameron                            lesions - I do not think this is the cause for the                            patient's bleeding.                           - Benign-appearing distal Shatski ring                           - Normal esophagus otherwise                           - Ulcerated GIST of the cardia, the very likely                            source of the patient's bleeding.                           - Large duodenal diverticulum.                           The GIST is the likely cause for the patient's                            bleeding, no high risk stigmata for bleeding, no                            endoscopic therapy performed. Further, no biopsies                            obtained today given nondiagnostic tunneled  biopsies previously that was complicated by                            bleeding in 2018, as this lesion is friable.                            Unfortunately the only way to prevent recurrent                            bleeding from this is a resection. Moderate Sedation:      No moderate sedation, case performed with MAC Recommendation:           - Return patient to ICU for ongoing care.                           - Clear liquid diet I think okay given bleeding                            appears to have stopped                           - Continue present medications including IV                            protonix drip.                           - Serial Hgb, transfuse as needed                           - General surgery consult                           - Call with questions, I will update the patient's                            family Procedure Code(s):        --- Professional ---                           [phone], Esophagogastroduodenoscopy, flexible,                             transoral; diagnostic, including collection of                            specimen(s) by brushing or washing, when performed                            (separate procedure) Diagnosis Code(s):        --- Professional ---                           K44.9, Diaphragmatic hernia without obstruction or  gangrene                           D49.0, Neoplasm of unspecified behavior of                            digestive system                           K57.10, Diverticulosis of small intestine without                            perforation or abscess without bleeding CPT copyright 2019 American [employer] Association. All rights reserved. The codes documented in this report are preliminary and upon coder review may  be revised to meet current compliance requirements. Remo Lipps P. Aurelius Gildersleeve, MD 09/05/2019 3:23:59 PM This report has been signed electronically. Number of Addenda: 0

## 2019-09-05 NOTE — Anesthesia Preprocedure Evaluation (Signed)
Anesthesia Evaluation  Patient identified by MRN, date of birth, ID band Patient awake    Reviewed: Allergy & Precautions, NPO status , Patient's Chart, lab work & pertinent test results  Airway Mallampati: II  TM Distance: >3 FB Neck ROM: Full    Dental  (+) Dental Advisory Given, Edentulous Lower, Edentulous Upper   Pulmonary neg pulmonary ROS,    Pulmonary exam normal breath sounds clear to auscultation       Cardiovascular hypertension, Normal cardiovascular exam+ dysrhythmias (LBBB)  Rhythm:Regular Rate:Normal  S/P IVC filter for DVT hx   Neuro/Psych negative neurological ROS  negative psych ROS   GI/Hepatic Neg liver ROS, PUD, upper gi bleed Colon cancer    Endo/Other  negative endocrine ROS  Renal/GU Renal InsufficiencyRenal disease     Musculoskeletal  (+) Arthritis ,   Abdominal   Peds  Hematology  (+) Blood dyscrasia, anemia ,   Anesthesia Other Findings Day of surgery medications reviewed with the patient.  Reproductive/Obstetrics                            Anesthesia Physical Anesthesia Plan  ASA: III  Anesthesia Plan: MAC   Post-op Pain Management:    Induction: Intravenous  PONV Risk Score and Plan: 2 and Propofol infusion and Treatment may vary due to age or medical condition  Airway Management Planned: Nasal Cannula and Natural Airway  Additional Equipment:   Intra-op Plan:   Post-operative Plan:   Informed Consent: I have reviewed the patients History and Physical, chart, labs and discussed the procedure including the risks, benefits and alternatives for the proposed anesthesia with the patient or authorized representative who has indicated his/her understanding and acceptance.     Dental advisory given  Plan Discussed with: CRNA and Anesthesiologist  Anesthesia Plan Comments:         Anesthesia Quick Evaluation

## 2019-09-05 NOTE — Progress Notes (Signed)
Gen surg aware of patient.  Full consult to follow.  Julia Robbins 4:37 PM 09/05/2019

## 2019-09-05 NOTE — H&P (View-Only) (Signed)
Consultation  Referring Provider:     Adella Hare MD Primary Care Physician:  Merrilee Seashore, MD Primary Gastroenterologist:     Ardis Hughs         Reason for Consultation:     GI bleed         HPI:   Julia Robbins is a 85 y.o. 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   Reportedly daughter found patient with blood on her garments yesterday and in the toilet. Patient had also vomited dark coffee ground material multiple times. She states she can't recall how many times this happened but is feeling better since admission last night. Nursing states she has not had any bowel movements nor vomited any blood over night. She has been on IV PPI and kept NPO. She does have some RLQ pain and is tender on exam. In looking through the chart she has had this pain documented since 2018, although she states it is rather new to her from what she can recall. COVID test pending.  She had a noncontrast CT scan in the ED which showed sigmoid diverticulosis and gallstones. A subsequent RUQ US showed a dilated CBD to 17.96mm with no obstructing mass lesion, and multiple large stones in the gallbladder. Her liver enzymes overnight were normal, they are pending right now for this AM.   She had a colonoscopy and EGD with Dr. Ardis Hughs in 2018. Colonoscopy at that time showed very minor left sided diverticulosis and a short colon, only about 40cm of colon is remaining. EGD showed a 3cm subepithelial mass in the gastric cardia. Tunneled bite on bite biopsies were nondiagnostic but this had the appearance of a suspected GIST. Given her age further treatment was not recommended as it was not causing symptoms.   Her Hgb was 11.0 on presentation last night, BUN of 54 (baseline 19), lactate of 4.0. LFTs were normal, lipase normal. Repeat CBC this AM shows Hgb of 10.1. HR in the room on exam was 90s to low 100s, BP in the low [id number]  systolic. She has been NPO.     Past Medical History:  Diagnosis Date  . Abnormal EKG    hx left bundle branch block dating back to 2012 on old ekg's  . Arthritis    degenerative.   . Cholelithiasis 2010   15 mm CBD on CT scan 2010.   Marland Kitchen Colon cancer ([place]) 1998   chemo and colectomy.  T3 N1 stage 3, 2/15 nodes +.   . DVT (deep venous thrombosis) ([facility])    right leg  . Elevated cholesterol   . Family history of adverse reaction to anesthesia    daughter has post op n/v  . Gastric ulcer 1998  . [place] filter in place 1998   for DVT. right leg  . HOH (hard of hearing)    slight    Past Surgical History:  Procedure Laterality Date  . COLON SURGERY  1998  . COLONOSCOPY WITH PROPOFOL N/A 04/12/2017   Procedure: COLONOSCOPY WITH PROPOFOL;  Surgeon: Milus Banister, MD;  Location: [facility]COPY;  Service: Endoscopy;  Laterality: N/A;  . DILATION AND CURETTAGE OF UTERUS  yrs ago  . ESOPHAGOGASTRODUODENOSCOPY (EGD) WITH PROPOFOL N/A 03/08/2017   Procedure: ESOPHAGOGASTRODUODENOSCOPY (EGD) WITH PROPOFOL;  Surgeon: Milus Banister, MD;  Location: [facility]COPY;  Service: Endoscopy;  Laterality: N/A;    Family History  Problem  Relation Age of Onset  . Heart disease Mother   . Colon cancer Neg Hx   . Stomach cancer Neg Hx   . Esophageal cancer Neg Hx   . Pancreatic cancer Neg Hx   . Liver disease Neg Hx      Social History   Tobacco Use  . Smoking status: Never Smoker  . Smokeless tobacco: Never Used  Substance Use Topics  . Alcohol use: No  . Drug use: No    Prior to Admission medications   Medication Sig Start Date End Date Taking? Authorizing Provider  acetaminophen (TYLENOL) 500 MG tablet Take 1,000 mg by mouth 2 (two) times daily as needed for moderate pain or headache.   Yes [provider]  Cholecalciferol (VITAMIN D-3) 1000 units CAPS Take 1 tablet by mouth daily.   Yes [provider]  cyclobenzaprine (FLEXERIL) 10 MG tablet Take 10 mg by mouth  2 (two) times daily as needed for muscle spasms.    Yes [provider]  donepezil (ARICEPT) 10 MG tablet Take 10 mg by mouth at bedtime.   Yes [provider]  ferrous sulfate 325 (65 FE) MG tablet Take 325 mg by mouth daily with breakfast.    Yes [provider]  furosemide (LASIX) 40 MG tablet Take 40 mg by mouth daily.    Yes [provider]  Multiple Vitamin (MULTIVITAMIN) capsule Take 1 capsule by mouth daily.   Yes [provider]  nortriptyline (PAMELOR) 10 MG capsule Take 10 mg by mouth 2 (two) times daily.   Yes [provider]  potassium chloride SA (K-DUR,KLOR-CON) 20 MEQ tablet Take 20 mEq by mouth 2 (two) times daily.    Yes [provider]  hyoscyamine (LEVSIN SL) 0.125 MG SL tablet Place 1 tablet (0.125 mg total) under the tongue every 4 (four) hours as needed. Patient not taking: Reported on 09/04/2019 04/12/17   Milus Banister, MD  oxyCODONE-acetaminophen (PERCOCET) 5-325 MG per tablet Take 2 tablets by mouth every 4 (four) hours as needed for pain. Patient not taking: Reported on 09/04/2019 11/30/12   Drenda Freeze, MD    Current Facility-Administered Medications  Medication Dose Route Frequency Provider Last Rate Last Dose  . 0.9 %  sodium chloride infusion   Intravenous Continuous Norins, Heinz Knuckles, MD 50 mL/hr at 09/05/19 0400    . acetaminophen (TYLENOL) tablet 650 mg  650 mg Oral Q6H PRN Norins, Heinz Knuckles, MD       Or  . acetaminophen (TYLENOL) suppository 650 mg  650 mg Rectal Q6H PRN Norins, Heinz Knuckles, MD      . Chlorhexidine Gluconate Cloth 2 % PADS 6 each  6 each Topical Daily Norins, Heinz Knuckles, MD      . cyclobenzaprine (FLEXERIL) tablet 10 mg  10 mg Oral BID PRN Norins, Heinz Knuckles, MD      . donepezil (ARICEPT) tablet 10 mg  10 mg Oral QHS Norins, Heinz Knuckles, MD   10 mg at 09/04/19 2223  . furosemide (LASIX) tablet 40 mg  40 mg Oral Daily Norins, Heinz Knuckles, MD   40 mg at 09/04/19 2223  . influenza  vaccine adjuvanted (FLUAD) injection 0.5 mL  0.5 mL Intramuscular Prior to discharge Norins, Heinz Knuckles, MD      . nortriptyline (PAMELOR) capsule 10 mg  10 mg Oral BID Neena Rhymes, MD   10 mg at 09/04/19 2157  . pantoprazole (PROTONIX) 80 mg in sodium chloride 0.9 % 250  mL (0.32 mg/mL) infusion  8 mg/hr Intravenous Continuous Norins, Heinz Knuckles, MD 25 mL/hr at 09/05/19 0400 8 mg/hr at 09/05/19 0400  . [START ON 09/08/2019] pantoprazole (PROTONIX) injection 40 mg  40 mg Intravenous Q12H Norins, Heinz Knuckles, MD        Allergies as of 09/04/2019  . (No Known Allergies)     Review of Systems:    As per HPI, otherwise negative    Physical Exam:  Vital signs in last 24 hours: Temp:  [98.3 F (36.8 C)-99 F (37.2 C)] 99 F (37.2 C) (12/04 0750) Pulse Rate:  [103-123] 118 (12/03 2220) Resp:  [17-31] 23 (12/04 0400) BP: (78-149)/(50-91) 99/66 (12/04 0400) SpO2:  [100 %] 100 % (12/04 0400) Weight:  [id number] kg] 69 kg (12/04 0205) Last BM Date: 09/04/19 General:   Pleasant female in NAD Head:  Normocephalic and atraumatic. Eyes:   No icterus.    Ears:  Normal auditory acuity. Neck:  Supple Lungs:  Respirations even and unlabored.  Heart:  Regular rate and rhythm; mildly tachycardic  Abdomen:  Soft, nondistended, R sided abdominal TTP.  Rectal:  Not performed.  Msk:  Symmetrical without gross deformities.  Extremities:  Without edema. Neurologic:  Alert and  oriented x4;  grossly normal neurologically. Skin:  Intact without significant lesions or rashes. Psych:  Alert and cooperative. Normal affect.  LAB RESULTS: Recent Labs    09/04/19 1928 09/05/19 0225  WBC 11.0* 11.3*  HGB 11.0* 10.1*  HCT 33.3* 31.8*  PLT 227 186   BMET Recent Labs    09/04/19 1928 09/05/19 0225  NA 140 142  K 4.6 3.8  CL 105 108  CO2 22 21*  GLUCOSE 126* 105*  BUN 55* 52*  CREATININE 1.76* 1.34*  CALCIUM 9.3 8.5*   LFT Recent Labs    09/04/19 1928  PROT 7.4  ALBUMIN 3.4*  AST 21  ALT 13   ALKPHOS 43  BILITOT 0.7   PT/INR Recent Labs    09/04/19 1928  LABPROT 14.8  INR 1.2    STUDIES: Ct Abdomen Pelvis Wo Contrast  Result Date: 09/04/2019 CLINICAL DATA:  Right lower quadrant pain. Right red blood per rectum. EXAM: CT ABDOMEN AND PELVIS WITHOUT CONTRAST TECHNIQUE: Multidetector CT imaging of the abdomen and pelvis was performed following the standard protocol without IV contrast. COMPARISON:  02/14/2017 FINDINGS: Lower chest: Scarring/fibrosis in the lung bases. No acute abnormality. Hepatobiliary: Gallstones fill the gallbladder. No focal hepatic abnormality. Pancreas: No focal abnormality or ductal dilatation. Spleen: No focal abnormality.  Normal size. Adrenals/Urinary Tract: No adrenal abnormality. No focal renal abnormality. No stones or hydronephrosis. Urinary bladder is unremarkable. Stomach/Bowel: Sigmoid diverticulosis. No active diverticulitis. Stomach and small bowel decompressed. No bowel obstruction. Vascular/Lymphatic: Aortic atherosclerosis. No enlarged abdominal or pelvic lymph nodes. IVC filter in place. Reproductive: Uterus and adnexa unremarkable.  No mass. Other: No free fluid or free air. Musculoskeletal: No acute bony abnormality. IMPRESSION: Sigmoid diverticulosis.  No active diverticulitis. Cholelithiasis. Aortic atherosclerosis. No acute findings. Electronically Signed   By: Rolm Baptise M.D.   On: 09/04/2019 20:43   US Abdomen Limited  Result Date: 09/05/2019 CLINICAL DATA:  Right upper quadrant abdominal pain for 1 day. EXAM: ULTRASOUND ABDOMEN LIMITED RIGHT UPPER QUADRANT COMPARISON:  CT of the abdomen and pelvis without contrast 09/04/2019 FINDINGS: Gallbladder: Wall echo complex is present with multiple known large stones. Gallbladder wall thickness is within normal limits at 1.9 mm. There is no sonographic Murphy's sign. Common bile duct: Diameter: The  common bile duct is markedly dilated, measuring 17.5 cm. No obstructing mass lesion is present.  Liver: Rolled the liver is diffusely echogenic. No focal lesions are present. Portal vein is patent on color Doppler imaging with normal direction of blood flow towards the liver. Other: None. IMPRESSION: 1. Dilated common bile duct without obstructing mass lesion. No intrahepatic biliary dilation is evident. 2. Large gallstones without evidence for acute cholecystitis 3. Hepatic steatosis.  No discrete hepatic lesions. Electronically Signed   By: San Morelle M.D.   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 I'm most concerned about bleeding from the suspected gastric GIST noted in 2018. If that is the case it may be difficult to control this endoscopically and prevent recurrent bleeding without it being resected, if it is the cause of her symptoms. I have discussed this with the patient, she seems to understand. Once her COVID 19 status has returned, will plan for EGD this AM if she is willing to proceed. I am going to call her family and obtain consent through them as well. Despite reported a fair amount of passing blood her Hgb has not dropped very much which is reassuring. She does not have much colon that remains, likely having rapid transit of blood from her stomach through rectum. Will continue IV PPI for now and serial Hgb. I have discussed risks / benefits of EGD and patient seemed to understand but will confirm with the family and determine goals of care. If they do wish to proceed, EGD will be later this AM. All questions answered.  Of note, patient has R sided pain, she states some of this is chronic. She does have large gallstones noted and a dilated CBD on [place], however her LFTs are normal as is lipase. Will trend LFTs and address this once the acute bleeding issues have been addressed  Aragon Cellar,  MD [facility]   ADDENDUM: Called and spoke with the patient's daughter Olin Hauser [phone]. Discussed the situation. She wants to proceed with endoscopic evaluation to clarify where the bleeding is coming from. I discussed ddx and potential that she is having bleeding from gastric GIST, and in this situation could be difficult to control endoscopically. Will see what we find on her endoscopy and then discuss how aggressive they want to be with her care, pending on what we find. Will let Olin Hauser know the results once completed. Procedure tentatively scheduled for this afternoon.  [place] Cellar, MD [facility]

## 2019-09-05 NOTE — Progress Notes (Signed)
PROGRESS NOTE    Julia Robbins  [id number]  DOB: 1923-09-11  DOA: 09/04/2019 PCP: Merrilee Seashore, MD  Brief Narrative:   85 y.o. female with medical history significant of h/o colon cancer s/p colectomy and chemo in 1998 (with several subsequent colonoscopies negative for recurrence, most recently 2018 by Dr. Ardis Hughs, [facility]), h/o diverticular bleed brought in for GI bleed evaluation. Patient also been c/o RUQ abdominal pain for several weeks. Daughter went to see her today and found BRB on garments and in toilet bowel. Patient reports throwing up dark coffee ground material.  ED Course: Patient was hypotensive on arrival to ED. She was administered IV Fluids with a good response. Initial lab revealed Hgb of 11g, Cr 1.96. CT abd/pelvis revealed diverticulosis and cholelithiasis otherwise negative. ED-MD consulted GI on call who recommend admission to step-down and CTA. Consultant will see patient in AM. [facility] called to admit patient.   Subjective:  Patient resting comfortably when seen in rounds this morning.  Denied any nausea/vomiting or bloody bowel movement today.  Nurse tech at bedside and denied any noticing any rectal bleed or melena this morning.  Objective: Vitals:   09/05/19 0205 09/05/19 0300 09/05/19 0400 09/05/19 0750  BP:  122/81 99/66   Pulse:      Resp:  (!) 22 (!) 23   Temp:   98.3 F (36.8 C) 99 F (37.2 C)  TempSrc:   Oral Oral  SpO2:   100%   Weight: 69 kg     Height: 5\' 5"  (1.651 m)       Intake/Output Summary (Last 24 hours) at 09/05/2019 0808 Last data filed at 09/05/2019 0400 Gross per 24 hour  Intake 1501.58 ml  Output 850 ml  Net 651.58 ml   Filed Weights   09/05/19 0205  Weight: 69 kg    Physical Examination:  General exam: Appears calm and comfortable  Respiratory system: Clear to auscultation. Respiratory effort normal. Cardiovascular system: S1 & S2 heard, RRR. No JVD, murmurs, rubs, gallops or clicks. No pedal  edema. Gastrointestinal system: Abdomen is nondistended, soft with mild epigastric as well as right upper quadrant tenderness. No organomegaly or masses felt. Normal BS heard Central nervous system: Alert and oriented. No new focal neurological deficits. Extremities: No contractures, edema or joint deformities.  Skin: Appears to have chronic stasis dermatitis although no significant edema noted Psychiatry: Judgement and insight appear normal. Mood & affect appropriate.   Data Reviewed: I have personally reviewed following labs and imaging studies  CBC: Recent Labs  Lab 09/04/19 1928 09/05/19 0225  WBC 11.0* 11.3*  HGB 11.0* 10.1*  HCT 33.3* 31.8*  MCV 95.7 97.8  PLT 227 [id number]   Basic Metabolic Panel: Recent Labs  Lab 09/04/19 1928 09/05/19 0225  NA 140 142  K 4.6 3.8  CL 105 108  CO2 22 21*  GLUCOSE 126* 105*  BUN 55* 52*  CREATININE 1.76* 1.34*  CALCIUM 9.3 8.5*   GFR: Estimated Creatinine Clearance: 25 mL/min (A) (by C-G formula based on SCr of 1.34 mg/dL (H)). Liver Function Tests: Recent Labs  Lab 09/04/19 1928  AST 21  ALT 13  ALKPHOS 43  BILITOT 0.7  PROT 7.4  ALBUMIN 3.4*   Recent Labs  Lab 09/04/19 2228  LIPASE 21   No results for input(s): AMMONIA in the last 168 hours. Coagulation Profile: Recent Labs  Lab 09/04/19 1928  INR 1.2   Cardiac Enzymes: No results for input(s): CKTOTAL, CKMB, CKMBINDEX, TROPONINI in the last  168 hours. BNP (last 3 results) No results for input(s): PROBNP in the last 8760 hours. HbA1C: No results for input(s): HGBA1C in the last 72 hours. CBG: No results for input(s): GLUCAP in the last 168 hours. Lipid Profile: No results for input(s): CHOL, HDL, LDLCALC, TRIG, CHOLHDL, LDLDIRECT in the last 72 hours. Thyroid Function Tests: No results for input(s): TSH, T4TOTAL, FREET4, T3FREE, THYROIDAB in the last 72 hours. Anemia Panel: No results for input(s): VITAMINB12, FOLATE, FERRITIN, TIBC, IRON, RETICCTPCT in the  last 72 hours. Sepsis Labs: Recent Labs  Lab 09/04/19 1957 09/04/19 2228  LATICACIDVEN 4.0* 4.7*    Recent Results (from the past 240 hour(s))  SARS CORONAVIRUS 2 (TAT 6-24 HRS) Nasopharyngeal Nasopharyngeal Swab     Status: None   Collection Time: 09/04/19  9:32 PM   Specimen: Nasopharyngeal Swab  Result Value Ref Range Status   SARS Coronavirus 2 NEGATIVE NEGATIVE Final    Comment: (NOTE) SARS-CoV-2 target nucleic acids are NOT DETECTED. The SARS-CoV-2 RNA is generally detectable in upper and lower respiratory specimens during the acute phase of infection. Negative results do not preclude SARS-CoV-2 infection, do not rule out co-infections with other pathogens, and should not be used as the sole basis for treatment or other patient management decisions. Negative results must be combined with clinical observations, patient history, and epidemiological information. The expected result is Negative. Fact Sheet for Patients: SugarRoll.be Fact Sheet for Healthcare Providers: https://www.woods-mathews.com/ This test is not yet approved or cleared by the [place] FDA and  has been authorized for detection and/or diagnosis of SARS-CoV-2 by FDA under an Emergency Use Authorization (EUA). This EUA will remain  in effect (meaning this test can be used) for the duration of the COVID-19 declaration under Section 56 4(b)(1) of the Act, 21 U.S.C. section 360bbb-3(b)(1), unless the authorization is terminated or revoked sooner. Performed at [facility], [place] [address], [place], [place] [address]   MRSA PCR Screening     Status: None   Collection Time: 09/04/19 10:06 PM   Specimen: Nasopharyngeal  Result Value Ref Range Status   MRSA by PCR NEGATIVE NEGATIVE Final    Comment:        The GeneXpert MRSA Assay (FDA approved for NASAL specimens only), is one component of a comprehensive MRSA colonization surveillance program. It is  not intended to diagnose MRSA infection nor to guide or monitor treatment for MRSA infections. Performed at [facility], [place] [address] [place],  [address]       Radiology Studies: Ct Abdomen Pelvis Wo Contrast  Result Date: 09/04/2019 CLINICAL DATA:  Right lower quadrant pain. Right red blood per rectum. EXAM: CT ABDOMEN AND PELVIS WITHOUT CONTRAST TECHNIQUE: Multidetector CT imaging of the abdomen and pelvis was performed following the standard protocol without IV contrast. COMPARISON:  02/14/2017 FINDINGS: Lower chest: Scarring/fibrosis in the lung bases. No acute abnormality. Hepatobiliary: Gallstones fill the gallbladder. No focal hepatic abnormality. Pancreas: No focal abnormality or ductal dilatation. Spleen: No focal abnormality.  Normal size. Adrenals/Urinary Tract: No adrenal abnormality. No focal renal abnormality. No stones or hydronephrosis. Urinary bladder is unremarkable. Stomach/Bowel: Sigmoid diverticulosis. No active diverticulitis. Stomach and small bowel decompressed. No bowel obstruction. Vascular/Lymphatic: Aortic atherosclerosis. No enlarged abdominal or pelvic lymph nodes. IVC filter in place. Reproductive: Uterus and adnexa unremarkable.  No mass. Other: No free fluid or free air. Musculoskeletal: No acute bony abnormality. IMPRESSION: Sigmoid diverticulosis.  No active diverticulitis. Cholelithiasis. Aortic atherosclerosis. No acute findings. Electronically Signed  By: Rolm Baptise M.D.   On: 09/04/2019 20:43   US Abdomen Limited  Result Date: 09/05/2019 CLINICAL DATA:  Right upper quadrant abdominal pain for 1 day. EXAM: ULTRASOUND ABDOMEN LIMITED RIGHT UPPER QUADRANT COMPARISON:  CT of the abdomen and pelvis without contrast 09/04/2019 FINDINGS: Gallbladder: Wall echo complex is present with multiple known large stones. Gallbladder wall thickness is within normal limits at 1.9 mm. There is no sonographic Murphy's sign. Common bile duct:  Diameter: The common bile duct is markedly dilated, measuring 17.5 cm. No obstructing mass lesion is present. Liver: Rolled the liver is diffusely echogenic. No focal lesions are present. Portal vein is patent on color Doppler imaging with normal direction of blood flow towards the liver. Other: None. IMPRESSION: 1. Dilated common bile duct without obstructing mass lesion. No intrahepatic biliary dilation is evident. 2. Large gallstones without evidence for acute cholecystitis 3. Hepatic steatosis.  No discrete hepatic lesions. Electronically Signed   By: San Morelle M.D.   On: 09/05/2019 06:02        Scheduled Meds: . Chlorhexidine Gluconate Cloth  6 each Topical Daily  . donepezil  10 mg Oral QHS  . furosemide  40 mg Oral Daily  . nortriptyline  10 mg Oral BID  . [START ON 09/08/2019] pantoprazole  40 mg Intravenous Q12H   Continuous Infusions: . sodium chloride 50 mL/hr at 09/05/19 0400  . pantoprozole (PROTONIX) infusion 8 mg/hr (09/05/19 0400)    Assessment & Plan:   1. Upper and lower GI bleed: Continue PPI drip , NPO/IV fluids. Monitor hemoglobin and transfuse as needed.  Noted slight drop from 11->10 but also on IV fluids.  Watch blood pressure and increase IV fluid rate.  She does have history of diverticular bleed and CT does show diverticulosis, also appears to have had gastric ulcer in the past.  May need upper endoscopy although not sure if candidate for colonoscopy.  Will await GI recommendations and follow-up CTA results.  2.  AKI: Creatinine appears to be improving with IV fluids.  Since patient n.p.o. and has significant blood loss with hypotension, will increase IV fluid rate (currently at 50 mL/h) to ensure adequate hydration.  Hold Lasix.  3.  Right upper quadrant pain: May have chronic cholecystitis given the duration of symptoms.  CT abdomen done without contrast on 12/3 shows gallstones without evidence of cholecystitis.  LFTs within normal limits.  Right upper  quadrant ultrasonogram ordered.  Will follow  4. History of colon cancer: s/p colectomy and chemo in 1998 with several subsequent colonoscopies negative for recurrence, most recently 2018 by Dr. Ardis Hughs, [facility].  5.  History of DVT: S/p IVC filter.  Not on anticoagulation as outpatient.    6.?  Hypertension: Currently somewhat hypotensive in the setting of problem #1.  Hold Lasix.  Not on any other home medications at baseline.  No prior echo in our records  7.  Cognitive impairment: Resume home medications when able to take p.o.  DVT prophylaxis: SCDs Code Status: Full code Family / Patient Communication: Discussed with patient Disposition Plan: TBD     LOS: 1 day    Time spent: 62 minutes    Guilford Shi, MD [facility] Pager [phone]  If 7PM-7AM, please contact night-coverage [email or web address] Password [email or web address] 09/05/2019, 8:08 AM

## 2019-09-05 NOTE — Consult Note (Addendum)
Consultation  Referring Provider:     Adella Hare MD Primary Care Physician:  Merrilee Seashore, MD Primary Gastroenterologist:     Ardis Hughs         Reason for Consultation:     GI bleed         HPI:   Julia Robbins is a 85 y.o. 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   Reportedly daughter found patient with blood on her garments yesterday and in the toilet. Patient had also vomited dark coffee ground material multiple times. She states she can't recall how many times this happened but is feeling better since admission last night. Nursing states she has not had any bowel movements nor vomited any blood over night. She has been on IV PPI and kept NPO. She does have some RLQ pain and is tender on exam. In looking through the chart she has had this pain documented since 2018, although she states it is rather new to her from what she can recall. COVID test pending.  She had a noncontrast CT scan in the ED which showed sigmoid diverticulosis and gallstones. A subsequent RUQ US showed a dilated CBD to 17.84mm with no obstructing mass lesion, and multiple large stones in the gallbladder. Her liver enzymes overnight were normal, they are pending right now for this AM.   She had a colonoscopy and EGD with Dr. Ardis Hughs in 2018. Colonoscopy at that time showed very minor left sided diverticulosis and a short colon, only about 40cm of colon is remaining. EGD showed a 3cm subepithelial mass in the gastric cardia. Tunneled bite on bite biopsies were nondiagnostic but this had the appearance of a suspected GIST. Given her age further treatment was not recommended as it was not causing symptoms.   Her Hgb was 11.0 on presentation last night, BUN of 54 (baseline 19), lactate of 4.0. LFTs were normal, lipase normal. Repeat CBC this AM shows Hgb of 10.1. HR in the room on exam was 90s to low 100s, BP in the low [id number]  systolic. She has been NPO.     Past Medical History:  Diagnosis Date  . Abnormal EKG    hx left bundle branch block dating back to 2012 on old ekg's  . Arthritis    degenerative.   . Cholelithiasis 2010   15 mm CBD on CT scan 2010.   Marland Kitchen Colon cancer ([facility]) 1998   chemo and colectomy.  T3 N1 stage 3, 2/15 nodes +.   . DVT (deep venous thrombosis) (HCC)    right leg  . Elevated cholesterol   . Family history of adverse reaction to anesthesia    daughter has post op n/v  . Gastric ulcer 1998  . [place] filter in place 1998   for DVT. right leg  . HOH (hard of hearing)    slight    Past Surgical History:  Procedure Laterality Date  . COLON SURGERY  1998  . COLONOSCOPY WITH PROPOFOL N/A 04/12/2017   Procedure: COLONOSCOPY WITH PROPOFOL;  Surgeon: Milus Banister, MD;  Location: [facility] END[facility]COPY;  Service: Endoscopy;  Laterality: N/A;  . DILATION AND CURETTAGE OF UTERUS  yrs ago  . ESOPHAGOGASTRODUODENOSCOPY (EGD) WITH PROPOFOL N/A 03/08/2017   Procedure: ESOPHAGOGASTRODUODENOSCOPY (EGD) WITH PROPOFOL;  Surgeon: Milus Banister, MD;  Location: [facility]COPY;  Service: Endoscopy;  Laterality: N/A;    Family History  Problem  Relation Age of Onset  . Heart disease Mother   . Colon cancer Neg Hx   . Stomach cancer Neg Hx   . Esophageal cancer Neg Hx   . Pancreatic cancer Neg Hx   . Liver disease Neg Hx      Social History   Tobacco Use  . Smoking status: Never Smoker  . Smokeless tobacco: Never Used  Substance Use Topics  . Alcohol use: No  . Drug use: No    Prior to Admission medications   Medication Sig Start Date End Date Taking? Authorizing Provider  acetaminophen (TYLENOL) 500 MG tablet Take 1,000 mg by mouth 2 (two) times daily as needed for moderate pain or headache.   Yes [provider]  Cholecalciferol (VITAMIN D-3) 1000 units CAPS Take 1 tablet by mouth daily.   Yes [provider]  cyclobenzaprine (FLEXERIL) 10 MG tablet Take 10 mg by mouth  2 (two) times daily as needed for muscle spasms.    Yes [provider]  donepezil (ARICEPT) 10 MG tablet Take 10 mg by mouth at bedtime.   Yes [provider]  ferrous sulfate 325 (65 FE) MG tablet Take 325 mg by mouth daily with breakfast.    Yes [provider]  furosemide (LASIX) 40 MG tablet Take 40 mg by mouth daily.    Yes [provider]  Multiple Vitamin (MULTIVITAMIN) capsule Take 1 capsule by mouth daily.   Yes [provider]  nortriptyline (PAMELOR) 10 MG capsule Take 10 mg by mouth 2 (two) times daily.   Yes [provider]  potassium chloride SA (K-DUR,KLOR-CON) 20 MEQ tablet Take 20 mEq by mouth 2 (two) times daily.    Yes [provider]  hyoscyamine (LEVSIN SL) 0.125 MG SL tablet Place 1 tablet (0.125 mg total) under the tongue every 4 (four) hours as needed. Patient not taking: Reported on 09/04/2019 04/12/17   Milus Banister, MD  oxyCODONE-acetaminophen (PERCOCET) 5-325 MG per tablet Take 2 tablets by mouth every 4 (four) hours as needed for pain. Patient not taking: Reported on 09/04/2019 11/30/12   Drenda Freeze, MD    Current Facility-Administered Medications  Medication Dose Route Frequency Provider Last Rate Last Dose  . 0.9 %  sodium chloride infusion   Intravenous Continuous Norins, Heinz Knuckles, MD 50 mL/hr at 09/05/19 0400    . acetaminophen (TYLENOL) tablet 650 mg  650 mg Oral Q6H PRN Norins, Heinz Knuckles, MD       Or  . acetaminophen (TYLENOL) suppository 650 mg  650 mg Rectal Q6H PRN Norins, Heinz Knuckles, MD      . Chlorhexidine Gluconate Cloth 2 % PADS 6 each  6 each Topical Daily Norins, Heinz Knuckles, MD      . cyclobenzaprine (FLEXERIL) tablet 10 mg  10 mg Oral BID PRN Norins, Heinz Knuckles, MD      . donepezil (ARICEPT) tablet 10 mg  10 mg Oral QHS Norins, Heinz Knuckles, MD   10 mg at 09/04/19 2223  . furosemide (LASIX) tablet 40 mg  40 mg Oral Daily Norins, Heinz Knuckles, MD   40 mg at 09/04/19 2223  . influenza  vaccine adjuvanted (FLUAD) injection 0.5 mL  0.5 mL Intramuscular Prior to discharge Norins, Heinz Knuckles, MD      . nortriptyline (PAMELOR) capsule 10 mg  10 mg Oral BID Neena Rhymes, MD   10 mg at 09/04/19 2157  . pantoprazole (PROTONIX) 80 mg in sodium chloride 0.9 % 250  mL (0.32 mg/mL) infusion  8 mg/hr Intravenous Continuous Norins, Heinz Knuckles, MD 25 mL/hr at 09/05/19 0400 8 mg/hr at 09/05/19 0400  . [START ON 09/08/2019] pantoprazole (PROTONIX) injection 40 mg  40 mg Intravenous Q12H Norins, Heinz Knuckles, MD        Allergies as of 09/04/2019  . (No Known Allergies)     Review of Systems:    As per HPI, otherwise negative    Physical Exam:  Vital signs in last 24 hours: Temp:  [98.3 F (36.8 C)-99 F (37.2 C)] 99 F (37.2 C) (12/04 0750) Pulse Rate:  [103-123] 118 (12/03 2220) Resp:  [17-31] 23 (12/04 0400) BP: (78-149)/(50-91) 99/66 (12/04 0400) SpO2:  [100 %] 100 % (12/04 0400) Weight:  NN:[id number] kg] 69 kg (12/04 0205) Last BM Date: 09/04/19 General:   Pleasant female in NAD Head:  Normocephalic and atraumatic. Eyes:   No icterus.    Ears:  Normal auditory acuity. Neck:  Supple Lungs:  Respirations even and unlabored.  Heart:  Regular rate and rhythm; mildly tachycardic  Abdomen:  Soft, nondistended, R sided abdominal TTP.  Rectal:  Not performed.  Msk:  Symmetrical without gross deformities.  Extremities:  Without edema. Neurologic:  Alert and  oriented x4;  grossly normal neurologically. Skin:  Intact without significant lesions or rashes. Psych:  Alert and cooperative. Normal affect.  LAB RESULTS: Recent Labs    09/04/19 1928 09/05/19 0225  WBC 11.0* 11.3*  HGB 11.0* 10.1*  HCT 33.3* 31.8*  PLT 227 186   BMET Recent Labs    09/04/19 1928 09/05/19 0225  NA 140 142  K 4.6 3.8  CL 105 108  CO2 22 21*  GLUCOSE 126* 105*  BUN 55* 52*  CREATININE 1.76* 1.34*  CALCIUM 9.3 8.5*   LFT Recent Labs    09/04/19 1928  PROT 7.4  ALBUMIN 3.4*  AST 21  ALT 13   ALKPHOS 43  BILITOT 0.7   PT/INR Recent Labs    09/04/19 1928  LABPROT 14.8  INR 1.2    STUDIES: Ct Abdomen Pelvis Wo Contrast  Result Date: 09/04/2019 CLINICAL DATA:  Right lower quadrant pain. Right red blood per rectum. EXAM: CT ABDOMEN AND PELVIS WITHOUT CONTRAST TECHNIQUE: Multidetector CT imaging of the abdomen and pelvis was performed following the standard protocol without IV contrast. COMPARISON:  02/14/2017 FINDINGS: Lower chest: Scarring/fibrosis in the lung bases. No acute abnormality. Hepatobiliary: Gallstones fill the gallbladder. No focal hepatic abnormality. Pancreas: No focal abnormality or ductal dilatation. Spleen: No focal abnormality.  Normal size. Adrenals/Urinary Tract: No adrenal abnormality. No focal renal abnormality. No stones or hydronephrosis. Urinary bladder is unremarkable. Stomach/Bowel: Sigmoid diverticulosis. No active diverticulitis. Stomach and small bowel decompressed. No bowel obstruction. Vascular/Lymphatic: Aortic atherosclerosis. No enlarged abdominal or pelvic lymph nodes. IVC filter in place. Reproductive: Uterus and adnexa unremarkable.  No mass. Other: No free fluid or free air. Musculoskeletal: No acute bony abnormality. IMPRESSION: Sigmoid diverticulosis.  No active diverticulitis. Cholelithiasis. Aortic atherosclerosis. No acute findings. Electronically Signed   By: Rolm Baptise M.D.   On: 09/04/2019 20:43   US Abdomen Limited  Result Date: 09/05/2019 CLINICAL DATA:  Right upper quadrant abdominal pain for 1 day. EXAM: ULTRASOUND ABDOMEN LIMITED RIGHT UPPER QUADRANT COMPARISON:  CT of the abdomen and pelvis without contrast 09/04/2019 FINDINGS: Gallbladder: Wall echo complex is present with multiple known large stones. Gallbladder wall thickness is within normal limits at 1.9 mm. There is no sonographic Murphy's sign. Common bile duct: Diameter: The  common bile duct is markedly dilated, measuring 17.5 cm. No obstructing mass lesion is present.  Liver: Rolled the liver is diffusely echogenic. No focal lesions are present. Portal vein is patent on color Doppler imaging with normal direction of blood flow towards the liver. Other: None. IMPRESSION: 1. Dilated common bile duct without obstructing mass lesion. No intrahepatic biliary dilation is evident. 2. Large gallstones without evidence for acute cholecystitis 3. Hepatic steatosis.  No discrete hepatic lesions. Electronically Signed   By: San Morelle M.D.   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 I'm most concerned about bleeding from the suspected gastric GIST noted in 2018. If that is the case it may be difficult to control this endoscopically and prevent recurrent bleeding without it being resected, if it is the cause of her symptoms. I have discussed this with the patient, she seems to understand. Once her COVID 19 status has returned, will plan for EGD this AM if she is willing to proceed. I am going to call her family and obtain consent through them as well. Despite reported a fair amount of passing blood her Hgb has not dropped very much which is reassuring. She does not have much colon that remains, likely having rapid transit of blood from her stomach through rectum. Will continue IV PPI for now and serial Hgb. I have discussed risks / benefits of EGD and patient seemed to understand but will confirm with the family and determine goals of care. If they do wish to proceed, EGD will be later this AM. All questions answered.  Of note, patient has R sided pain, she states some of this is chronic. She does have large gallstones noted and a dilated CBD on [place], however her LFTs are normal as is lipase. Will trend LFTs and address this once the acute bleeding issues have been addressed  [place] Cellar,  MD [facility]   ADDENDUM: Called and spoke with the patient's daughter Olin Hauser [phone]. Discussed the situation. She wants to proceed with endoscopic evaluation to clarify where the bleeding is coming from. I discussed ddx and potential that she is having bleeding from gastric GIST, and in this situation could be difficult to control endoscopically. Will see what we find on her endoscopy and then discuss how aggressive they want to be with her care, pending on what we find. Will let Olin Hauser know the results once completed. Procedure tentatively scheduled for this afternoon.  Bolivia Cellar, MD [facility]

## 2019-09-05 NOTE — Transfer of Care (Signed)
Immediate Anesthesia Transfer of Care Note  Patient: ARTA STUMP  Procedure(s) Performed: ESOPHAGOGASTRODUODENOSCOPY (EGD) WITH PROPOFOL (N/A )  Patient Location: PACU and Endoscopy Unit  Anesthesia Type:MAC  Level of Consciousness: awake, alert  and oriented  Airway & Oxygen Therapy: Patient Spontanous Breathing and Patient connected to face mask oxygen  Post-op Assessment: Report given to RN and Post -op Vital signs reviewed and stable  Post vital signs: Reviewed and stable  Last Vitals:  Vitals Value Taken Time  BP    Temp    Pulse 105 09/05/19 1520  Resp 26 09/05/19 1520  SpO2 100 % 09/05/19 1520  Vitals shown include unvalidated device data.  Last Pain:  Vitals:   09/05/19 1427  TempSrc:   PainSc: 0-No pain         Complications: No apparent anesthesia complications

## 2019-09-05 NOTE — Interval H&P Note (Signed)
History and Physical Interval Note:  09/05/2019 2:51 PM  Julia Robbins  has presented today for surgery, with the diagnosis of upper gi bleed.  The various methods of treatment have been discussed with the patient and family. After consideration of risks, benefits and other options for treatment, the patient has consented to  Procedure(s): ESOPHAGOGASTRODUODENOSCOPY (EGD) WITH PROPOFOL (N/A) as a surgical intervention.  The patient's history has been reviewed, patient examined, no change in status, stable for surgery.  I have reviewed the patient's chart and labs.  Questions were answered to the patient's satisfaction.     [place]

## 2019-09-06 ENCOUNTER — Inpatient Hospital Stay (HOSPITAL_COMMUNITY): Payer: Medicare Other

## 2019-09-06 DIAGNOSIS — I340 Nonrheumatic mitral (valve) insufficiency: Secondary | ICD-10-CM

## 2019-09-06 LAB — BASIC METABOLIC PANEL
Anion gap: 9 (ref 5–15)
BUN: 25 mg/dL — ABNORMAL HIGH (ref 8–23)
CO2: 24 mmol/L (ref 22–32)
Calcium: 8.4 mg/dL — ABNORMAL LOW (ref 8.9–10.3)
Chloride: 112 mmol/L — ABNORMAL HIGH (ref 98–111)
Creatinine, Ser: 1.04 mg/dL — ABNORMAL HIGH (ref 0.44–1.00)
GFR calc Af Amer: 53 mL/min — ABNORMAL LOW (ref >60)
GFR calc non Af Amer: 46 mL/min — ABNORMAL LOW (ref >60)
Glucose, Bld: 79 mg/dL (ref 70–99)
Potassium: 3.4 mmol/L — ABNORMAL LOW (ref 3.5–5.1)
Sodium: 145 mmol/L (ref 135–145)

## 2019-09-06 LAB — CBC
HCT: 24.5 % — ABNORMAL LOW (ref 36.0–46.0)
Hemoglobin: 7.4 g/dL — ABNORMAL LOW (ref 12.0–15.0)
MCH: 30.7 pg (ref 26.0–34.0)
MCHC: 30.2 g/dL (ref 30.0–36.0)
MCV: 101.7 fL — ABNORMAL HIGH (ref 80.0–100.0)
Platelets: 127 10*3/uL — ABNORMAL LOW (ref 150–400)
RBC: 2.41 MIL/uL — ABNORMAL LOW (ref 3.87–5.11)
RDW: 12.7 % (ref 11.5–15.5)
WBC: 7.5 10*3/uL (ref 4.0–10.5)
nRBC: 0 % (ref 0.0–0.2)

## 2019-09-06 LAB — CBC WITH DIFFERENTIAL/PLATELET
Abs Immature Granulocytes: 0.04 10*3/uL (ref 0.00–0.07)
Basophils Absolute: 0.1 10*3/uL (ref 0.0–0.1)
Basophils Relative: 1 %
Eosinophils Absolute: 0.5 10*3/uL (ref 0.0–0.5)
Eosinophils Relative: 7 %
HCT: 23.8 % — ABNORMAL LOW (ref 36.0–46.0)
Hemoglobin: 7.5 g/dL — ABNORMAL LOW (ref 12.0–15.0)
Immature Granulocytes: 1 %
Lymphocytes Relative: 26 %
Lymphs Abs: 1.8 10*3/uL (ref 0.7–4.0)
MCH: 31.9 pg (ref 26.0–34.0)
MCHC: 31.5 g/dL (ref 30.0–36.0)
MCV: 101.3 fL — ABNORMAL HIGH (ref 80.0–100.0)
Monocytes Absolute: 0.6 10*3/uL (ref 0.1–1.0)
Monocytes Relative: 9 %
Neutro Abs: 4.1 10*3/uL (ref 1.7–7.7)
Neutrophils Relative %: 56 %
Platelets: 135 10*3/uL — ABNORMAL LOW (ref 150–400)
RBC: 2.35 MIL/uL — ABNORMAL LOW (ref 3.87–5.11)
RDW: 12.7 % (ref 11.5–15.5)
WBC: 7.1 10*3/uL (ref 4.0–10.5)
nRBC: 0 % (ref 0.0–0.2)

## 2019-09-06 LAB — ECHOCARDIOGRAM COMPLETE
Height: 65 in
Weight: 2525.59 oz

## 2019-09-06 MED ORDER — PANTOPRAZOLE SODIUM 40 MG IV SOLR
40.0000 mg | Freq: Two times a day (BID) | INTRAVENOUS | Status: DC
  Administered 2019-09-06 – 2019-09-09 (×7): 40 mg via INTRAVENOUS
  Filled 2019-09-06 (×7): qty 40

## 2019-09-06 MED ORDER — POTASSIUM CHLORIDE 10 MEQ/100ML IV SOLN
10.0000 meq | INTRAVENOUS | Status: AC
  Administered 2019-09-06 (×3): 10 meq via INTRAVENOUS
  Filled 2019-09-06 (×2): qty 100

## 2019-09-06 NOTE — Progress Notes (Signed)
[facility]                                                                              Patient Demographics  Julia Robbins, is a 85 y.o. female, DOB - 1923/08/22, [id number]  Admit date - 09/04/2019   Admitting Physician Neena Rhymes, MD  Outpatient Primary MD for the patient is Merrilee Seashore, MD  Outpatient specialists:   LOS - 2  days   Medical records reviewed and are as summarized below:    No chief complaint on file.      Brief summary   85 y.o.femalewith medical history significant ofh/o colon cancer s/p colectomy and chemo in 1998 (with several subsequent colonoscopies negative for recurrence, most recently 2018 by Dr. Ardis Hughs, [facility]), h/o diverticular bleed brought in for GI bleed evaluation. Patient also been c/o RUQ abdominal pain for several weeks. Daughter went to see her today and found BRB on garments and in toilet bowel. Patient reports throwing up dark coffee ground material.  ED Course:Patient was hypotensive on arrival to ED. She was administered IV Fluids with a good response. Initial lab revealed Hgb of 11g, Cr 1.96. CT abd/pelvis revealed diverticulosis and cholelithiasis otherwise negative. ED-MD consulted GI on call who recommend admission to step-down and CTA. 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  Hemoglobin was 11 on admission. -GI consulted and patient underwent EGD which showed nonbleeding Cameron ulcers associated with 5 cm hiatal hernia, small ulcerated centrally umbilicated mass in the cardia of the stomach, no active bleeding, felt likely to be the source of GI bleed. -Currently no complaints, continue PPI, Carafate -General surgery consulted, appreciate  recommendations, requested cardiology clearance prior to surgical intervention -Consulted cardiology, discussed with Dr. Caryl Comes -Repeat H&H, transfuse for hemoglobin less than 7.5.  Hemoglobin this morning 7.4, yesterday 9.0  Acute kidney injury with lactic acidosis -Continue to hold Lasix, creatinine was 1.76 at the time of admission -Continue IV fluid hydration, creatinine improving 1.0 today   Hypokalemia -Replaced IV  Right upper quadrant pain, intermittent -Abdominal ultrasound showed dilated common bile duct without obstructing mass, no intrahepatic biliary dilatation.  Large gallstones without evidence for acute cholecystitis, hepatic steatosis -GI and general surgery currently following   History of colon CA -Status post colectomy, chemo in 1998 with several subsequent colonoscopies negative for recurrence most recently by Dr. Ardis Hughs in 2018.  History of DVT Status post IVC filter not on any anticoagulation outpatient  Hypertension -Continue to hold Lasix, BP has been soft -No prior echo in the system, will order  Cognitive impairment -Currently appears to be at baseline   Code Status: Full CODE STATUS DVT Prophylaxis:   SCD's Family Communication: Discussed all imaging results, lab results, explained to the patient    Disposition Plan: Awaiting further management, cardiology clearance, surgery.  Remains inpatient  Time Spent in minutes   35 minutes  Procedures:  EGD  Consultants:  Gastroenterology General surgery Cardiology  Antimicrobials:   Anti-infectives (From admission, onward)   None          Medications  Scheduled Meds: . Chlorhexidine Gluconate Cloth  6 each Topical Daily  . nortriptyline  10 mg Oral QHS  . [START ON 09/08/2019] pantoprazole  40 mg Intravenous Q12H  . sucralfate  1 g Oral TID WC & HS   Continuous Infusions: . lactated ringers 20 mL/hr at 09/06/19 0802  . pantoprozole (PROTONIX) infusion 8 mg/hr (09/06/19 0802)    PRN Meds:.acetaminophen **OR** acetaminophen, fentaNYL (SUBLIMAZE) injection, influenza vaccine adjuvanted, ondansetron (ZOFRAN) IV      Subjective:   Julia Robbins was seen and examined today.  Denies any specific complaints today, has intermittent abdominal pain, currently none.  No BMs overnight.  No active nausea or vomiting.  Denies any chest pain or shortness of breath.  Has generalized weakness. Patient denies dizziness, new focal weakness, numbess, tingling. No acute events overnight.    Objective:   Vitals:   09/06/19 0400 09/06/19 0415 09/06/19 0737 09/06/19 0800  BP:   104/64 115/60  Pulse:      Resp:  19 17 (!) 21  Temp: 98.4 F (36.9 C)     TempSrc: Oral     SpO2:      Weight:  71.6 kg    Height:        Intake/Output Summary (Last 24 hours) at 09/06/2019 0944 Last data filed at 09/06/2019 0802 Gross per 24 hour  Intake 2255.1 ml  Output 1400 ml  Net 855.1 ml     Wt Readings from Last 3 Encounters:  09/06/19 71.6 kg  04/12/17 77.1 kg  02/28/17 77.5 kg     Exam  General: Alert and oriented x 3, NAD  Eyes:   HEENT:  Atraumatic, normocephalic, normal oropharynx  Cardiovascular: S1 S2 auscultated, no murmurs, RRR  Respiratory: Clear to auscultation bilaterally  Gastrointestinal: Soft, nontender, nondistended, + bowel sounds  Ext: no pedal edema bilaterally  Neuro: No new deficits  Musculoskeletal: No digital cyanosis, clubbing  Skin: No rashes  Psych: Normal affect and demeanor, alert and oriented x3    Data Reviewed:  I have personally reviewed following labs and imaging studies  Micro Results Recent Results (from the past 240 hour(s))  SARS CORONAVIRUS 2 (TAT 6-24 HRS) Nasopharyngeal Nasopharyngeal Swab     Status: None   Collection Time: 09/04/19  9:32 PM   Specimen: Nasopharyngeal Swab  Result Value Ref Range Status   SARS Coronavirus 2 NEGATIVE NEGATIVE Final    Comment: (NOTE) SARS-CoV-2 target nucleic acids are NOT  DETECTED. The SARS-CoV-2 RNA is generally detectable in upper and lower respiratory specimens during the acute phase of infection. Negative results do not preclude SARS-CoV-2 infection, do not rule out co-infections with other pathogens, and should not be used as the sole basis for treatment or other patient management decisions. Negative results must be combined with clinical observations, patient history, and epidemiological information. The expected result is Negative. Fact Sheet for Patients: SugarRoll.be Fact Sheet for Healthcare Providers: https://www.woods-mathews.com/ This test is not yet approved or cleared by the [place] FDA and  has been authorized for detection and/or diagnosis of SARS-CoV-2 by FDA under an Emergency Use Authorization (EUA). This EUA will remain  in effect (meaning this test can be used) for the duration of the COVID-19 declaration under Section 56 4(b)(1) of the Act, 21 U.S.C. section 360bbb-3(b)(1), unless the authorization is terminated or revoked sooner. Performed at [facility]  [facility], [place] [address], [place], [place] [address]   MRSA PCR Screening     Status: None   Collection Time: 09/04/19 10:06 PM   Specimen: Nasopharyngeal  Result Value Ref Range Status   MRSA by PCR NEGATIVE NEGATIVE Final    Comment:        The GeneXpert MRSA Assay (FDA approved for NASAL specimens only), is one component of a comprehensive MRSA colonization surveillance program. It is not intended to diagnose MRSA infection nor to guide or monitor treatment for MRSA infections. Performed at [facility], [place] [address] [place], [place] [address]     Radiology Reports Ct Abdomen Pelvis Wo Contrast  Result Date: 09/04/2019 CLINICAL DATA:  Right lower quadrant pain. Right red blood per rectum. EXAM: CT ABDOMEN AND PELVIS WITHOUT CONTRAST TECHNIQUE: Multidetector CT imaging of the abdomen and pelvis  was performed following the standard protocol without IV contrast. COMPARISON:  02/14/2017 FINDINGS: Lower chest: Scarring/fibrosis in the lung bases. No acute abnormality. Hepatobiliary: Gallstones fill the gallbladder. No focal hepatic abnormality. Pancreas: No focal abnormality or ductal dilatation. Spleen: No focal abnormality.  Normal size. Adrenals/Urinary Tract: No adrenal abnormality. No focal renal abnormality. No stones or hydronephrosis. Urinary bladder is unremarkable. Stomach/Bowel: Sigmoid diverticulosis. No active diverticulitis. Stomach and small bowel decompressed. No bowel obstruction. Vascular/Lymphatic: Aortic atherosclerosis. No enlarged abdominal or pelvic lymph nodes. IVC filter in place. Reproductive: Uterus and adnexa unremarkable.  No mass. Other: No free fluid or free air. Musculoskeletal: No acute bony abnormality. IMPRESSION: Sigmoid diverticulosis.  No active diverticulitis. Cholelithiasis. Aortic atherosclerosis. No acute findings. Electronically Signed   By: Rolm Baptise M.D.   On: 09/04/2019 20:43   US Abdomen Limited  Result Date: 09/05/2019 CLINICAL DATA:  Right upper quadrant abdominal pain for 1 day. EXAM: ULTRASOUND ABDOMEN LIMITED RIGHT UPPER QUADRANT COMPARISON:  CT of the abdomen and pelvis without contrast 09/04/2019 FINDINGS: Gallbladder: Wall echo complex is present with multiple known large stones. Gallbladder wall thickness is within normal limits at 1.9 mm. There is no sonographic Murphy's sign. Common bile duct: Diameter: The common bile duct is markedly dilated, measuring 17.5 cm. No obstructing mass lesion is present. Liver: Rolled the liver is diffusely echogenic. No focal lesions are present. Portal vein is patent on color Doppler imaging with normal direction of blood flow towards the liver. Other: None. IMPRESSION: 1. Dilated common bile duct without obstructing mass lesion. No intrahepatic biliary dilation is evident. 2. Large gallstones without evidence for  acute cholecystitis 3. Hepatic steatosis.  No discrete hepatic lesions. Electronically Signed   By: San Morelle M.D.   On: 09/05/2019 06:02    Lab Data:  CBC: Recent Labs  Lab 09/04/19 1928 09/05/19 0225 09/05/19 1204 09/06/19 0713  WBC 11.0* 11.3*  --  7.5  HGB 11.0* 10.1* 9.0* 7.4*  HCT 33.3* 31.8*  --  24.5*  MCV 95.7 97.8  --  101.7*  PLT 227 186  --  [id number]   Basic Metabolic Panel: Recent Labs  Lab 09/04/19 1928 09/05/19 0225 09/06/19 0713  NA 140 142 145  K 4.6 3.8 3.4*  CL 105 108 112*  CO2 22 21* 24  GLUCOSE 126* 105* 79  BUN 55* 52* 25*  CREATININE 1.76* 1.34* 1.04*  CALCIUM 9.3 8.5* 8.4*   GFR: Estimated Creatinine Clearance: 32.8 mL/min (A) (by C-G formula based on SCr of 1.04 mg/dL (H)). Liver Function Tests: Recent Labs  Lab 09/04/19 1928 09/05/19 0225  AST 21 16  ALT 13 12  ALKPHOS 43 39  BILITOT 0.7 0.3  PROT 7.4 6.7  ALBUMIN 3.4* 3.2*   Recent Labs  Lab 09/04/19 2228  LIPASE 21   No results for input(s): AMMONIA in the last 168 hours. Coagulation Profile: Recent Labs  Lab 09/04/19 1928  INR 1.2   Cardiac Enzymes: No results for input(s): CKTOTAL, CKMB, CKMBINDEX, TROPONINI in the last 168 hours. BNP (last 3 results) No results for input(s): PROBNP in the last 8760 hours. HbA1C: No results for input(s): HGBA1C in the last 72 hours. CBG: No results for input(s): GLUCAP in the last 168 hours. Lipid Profile: No results for input(s): CHOL, HDL, LDLCALC, TRIG, CHOLHDL, LDLDIRECT in the last 72 hours. Thyroid Function Tests: No results for input(s): TSH, T4TOTAL, FREET4, T3FREE, THYROIDAB in the last 72 hours. Anemia Panel: No results for input(s): VITAMINB12, FOLATE, FERRITIN, TIBC, IRON, RETICCTPCT in the last 72 hours. Urine analysis:    Component Value Date/Time   COLORURINE AMBER BIOCHEMICALS MAY BE AFFECTED BY COLOR (A) 10/04/2008 1330   APPEARANCEUR CLEAR 10/04/2008 1330   LABSPEC 1.038 (H) 10/04/2008 1330   PHURINE  5.0 10/04/2008 1330   GLUCOSEU NEGATIVE 10/04/2008 1330   HGBUR NEGATIVE 10/04/2008 1330   BILIRUBINUR SMALL (A) 10/04/2008 1330   KETONESUR 15 (A) 10/04/2008 1330   PROTEINUR 30 (A) 10/04/2008 1330   UROBILINOGEN 0.2 10/04/2008 1330   NITRITE NEGATIVE 10/04/2008 1330   LEUKOCYTESUR NEGATIVE 10/04/2008 1330     Wynette Jersey M.D. [facility] 09/06/2019, 9:44 AM   Call night coverage person covering after 7pm

## 2019-09-06 NOTE — Progress Notes (Signed)
[facility] Progress Note  CC:  GI bleed  Subjective:  Feels good.  No sign of bleeding, no BM's per patient and nursing staff.  Surgery has seen her and had extensive conversation with the daughter.  Hgb down to 7.5 grams, but once again no sign of bleeding.  Objective:  Vital signs in last 24 hours: Temp:  [97.7 F (36.5 C)-98.4 F (36.9 C)] 98 F (36.7 C) (12/05 0953) Pulse Rate:  [87-105] 94 (12/05 0345) Resp:  [14-32] 22 (12/05 1135) BP: (92-125)/(46-79) 110/67 (12/05 1135) SpO2:  [100 %] 100 % (12/05 0345) Weight:  [69 kg-71.6 kg] 71.6 kg (12/05 0415) Last BM Date: 09/04/19 General:  Alert, Well-developed, in NAD Heart:  Regular rate and rhythm; murmur noted Pulm:  CTAB.  No increased WOB. Abdomen:  Soft, non-distended.  BS present.  Non-tender.  Extremities:  Without edema. Neurologic:  Alert and oriented x 4;  grossly normal neurologically. Psych:  Alert and cooperative. Normal mood and affect.  Intake/Output from previous day: 12/04 0701 - 12/05 0700 In: 2118.6 [P.O.:350; I.V.:1768.6] Out: 1350 [Urine:1350] Intake/Output this shift: Total I/O In: 375 [I.V.:294.6; IV Piggyback:80.4] Out: 50 [Urine:50]  Lab Results: Recent Labs    09/05/19 0225 09/05/19 1204 09/06/19 0713 09/06/19 0918  WBC 11.3*  --  7.5 7.1  HGB 10.1* 9.0* 7.4* 7.5*  HCT 31.8*  --  24.5* 23.8*  PLT 186  --  127* 135*   BMET Recent Labs    09/04/19 1928 09/05/19 0225 09/06/19 0713  NA 140 142 145  K 4.6 3.8 3.4*  CL 105 108 112*  CO2 22 21* 24  GLUCOSE 126* 105* 79  BUN 55* 52* 25*  CREATININE 1.76* 1.34* 1.04*  CALCIUM 9.3 8.5* 8.4*   LFT Recent Labs    09/05/19 0225  PROT 6.7  ALBUMIN 3.2*  AST 16  ALT 12  ALKPHOS 39  BILITOT 0.3  BILIDIR 0.1  IBILI 0.2*   PT/INR Recent Labs    09/04/19 1928  LABPROT 14.8  INR 1.2   Ct Abdomen Pelvis Wo Contrast  Result Date: 09/04/2019 CLINICAL DATA:  Right lower quadrant pain. Right red blood per  rectum. EXAM: CT ABDOMEN AND PELVIS WITHOUT CONTRAST TECHNIQUE: Multidetector CT imaging of the abdomen and pelvis was performed following the standard protocol without IV contrast. COMPARISON:  02/14/2017 FINDINGS: Lower chest: Scarring/fibrosis in the lung bases. No acute abnormality. Hepatobiliary: Gallstones fill the gallbladder. No focal hepatic abnormality. Pancreas: No focal abnormality or ductal dilatation. Spleen: No focal abnormality.  Normal size. Adrenals/Urinary Tract: No adrenal abnormality. No focal renal abnormality. No stones or hydronephrosis. Urinary bladder is unremarkable. Stomach/Bowel: Sigmoid diverticulosis. No active diverticulitis. Stomach and small bowel decompressed. No bowel obstruction. Vascular/Lymphatic: Aortic atherosclerosis. No enlarged abdominal or pelvic lymph nodes. IVC filter in place. Reproductive: Uterus and adnexa unremarkable.  No mass. Other: No free fluid or free air. Musculoskeletal: No acute bony abnormality. IMPRESSION: Sigmoid diverticulosis.  No active diverticulitis. Cholelithiasis. Aortic atherosclerosis. No acute findings. Electronically Signed   By: Rolm Baptise M.D.   On: 09/04/2019 20:43   US Abdomen Limited  Result Date: 09/05/2019 CLINICAL DATA:  Right upper quadrant abdominal pain for 1 day. EXAM: ULTRASOUND ABDOMEN LIMITED RIGHT UPPER QUADRANT COMPARISON:  CT of the abdomen and pelvis without contrast 09/04/2019 FINDINGS: Gallbladder: Wall echo complex is present with multiple known large stones. Gallbladder wall thickness is within normal limits at 1.9 mm. There is no sonographic Murphy's sign. Common bile duct: Diameter:  The common bile duct is markedly dilated, measuring 17.5 cm. No obstructing mass lesion is present. Liver: Rolled the liver is diffusely echogenic. No focal lesions are present. Portal vein is patent on color Doppler imaging with normal direction of blood flow towards the liver. Other: None. IMPRESSION: 1. Dilated common bile duct  without obstructing mass lesion. No intrahepatic biliary dilation is evident. 2. Large gallstones without evidence for acute cholecystitis 3. Hepatic steatosis.  No discrete hepatic lesions. Electronically Signed   By: San Morelle M.D.   On: 09/05/2019 06:02   Assessment / Plan: *85 year old female with an upper GI bleed.  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  No active bleeding.  Surgery has been consulted and has had extensive conversation with her daughter.  They are awaiting cardiac work-up/clearance before deciding on any surgical intervention.  Her hemoglobin is down some this morning, but there is been no reports of bleeding, not even any bowel movements since her procedure.  -Monitor hemoglobin and transfuse if needed. -Will change to pantoprazole 40 mg IV twice daily for now. -Will advance to full liquids.   LOS: 2 days   Laban Emperor. Tauriel Scronce  09/06/2019, 12:56 PM

## 2019-09-06 NOTE — Progress Notes (Signed)
Subjective No acute events. No reported BMs overnight. Denies any complaints today.  Objective: Vital signs in last 24 hours: Temp:  [97.7 F (36.5 C)-98.4 F (36.9 C)] 98.4 F (36.9 C) (12/05 0400) Pulse Rate:  [87-105] 94 (12/05 0345) Resp:  [14-32] 21 (12/05 0800) BP: (92-125)/(46-79) 115/60 (12/05 0800) SpO2:  [99 %-100 %] 100 % (12/05 0345) Weight:  [69 kg-71.6 kg] 71.6 kg (12/05 0415) Last BM Date: 09/04/19  Intake/Output from previous day: 12/04 0701 - 12/05 0700 In: 2118.6 [P.O.:350; I.V.:1768.6] Out: 1350 [Urine:1350] Intake/Output this shift: Total I/O In: 136.6 [I.V.:136.6] Out: 50 [Urine:50]  Gen: NAD, comfortable CV: RRR Pulm: Normal work of breathing Abd: Soft, NT/ND Ext: SCDs in place  Lab Results: CBC  Recent Labs    09/05/19 0225 09/05/19 1204 09/06/19 0713  WBC 11.3*  --  7.5  HGB 10.1* 9.0* 7.4*  HCT 31.8*  --  24.5*  PLT 186  --  127*   BMET Recent Labs    09/05/19 0225 09/06/19 0713  NA 142 145  K 3.8 3.4*  CL 108 112*  CO2 21* 24  GLUCOSE 105* 79  BUN 52* 25*  CREATININE 1.34* 1.04*  CALCIUM 8.5* 8.4*   PT/INR Recent Labs    09/04/19 1928  LABPROT 14.8  INR 1.2   ABG No results for input(s): PHART, HCO3 in the last 72 hours.  Invalid input(s): PCO2, PO2  Studies/Results:  Anti-infectives: Anti-infectives (From admission, onward)   None       Assessment/Plan: Patient Active Problem List   Diagnosis Date Noted  . Acute lower GI bleeding 09/04/2019  . Hematemesis 09/04/2019  . Cholelithiasis 09/04/2019  . Abnormal abdominal CT scan   . RLQ abdominal pain   . Acute blood loss anemia 12/02/2015  . CKD (chronic kidney disease) stage 3, GFR 30-59 ml/min 12/02/2015  . GI bleed 12/01/2015  . Acute kidney injury ([place]) 12/01/2015  . Elevated lactic acid level 12/01/2015  . Essential hypertension 12/01/2015  . History of DVT (deep vein thrombosis) 10/31/2012  . Hyperlipidemia 10/31/2012  . History of colon cancer  03/29/2010   s/p Procedure(s): ESOPHAGOGASTRODUODENOSCOPY (EGD) WITH PROPOFOL 09/05/2019  Hgb 7.4 today (from 9<--10.1). Expected drop/equilibriation. Continue to monitor, transfuse if <7 -Cardiology evaluation for preop/clearance given advanced age -I spoke at length on the phone with her daughter, Olin Hauser, today and we discussed everything as well. Goals of care will be to prevent further bleeding as this has interfered with quality of life. She lives alone and is independent. If surgery were to likely derail things and lead to significant loss of independence, would elect to observe. We discussed this is certainly possible seeing as she is 85 years old, has had prior open colectomy, need for potential open surgery this go round as well. -No decisions need to be made today per se - will await cardiology evaluation as well -ACS surgical risk calculator:   LOS: 2 days   Sharon Mt. Dema Severin, M.D. [facility] Use [email or web address] to contact on call provider

## 2019-09-06 NOTE — Progress Notes (Signed)
  Echocardiogram 2D Echocardiogram has been performed.  Julia Robbins 09/06/2019, 3:46 PM

## 2019-09-07 ENCOUNTER — Other Ambulatory Visit: Payer: Self-pay

## 2019-09-07 DIAGNOSIS — I10 Essential (primary) hypertension: Secondary | ICD-10-CM

## 2019-09-07 DIAGNOSIS — R0600 Dyspnea, unspecified: Secondary | ICD-10-CM

## 2019-09-07 DIAGNOSIS — I209 Angina pectoris, unspecified: Secondary | ICD-10-CM

## 2019-09-07 DIAGNOSIS — I350 Nonrheumatic aortic (valve) stenosis: Secondary | ICD-10-CM

## 2019-09-07 DIAGNOSIS — N179 Acute kidney failure, unspecified: Secondary | ICD-10-CM

## 2019-09-07 LAB — BASIC METABOLIC PANEL
Anion gap: 9 (ref 5–15)
BUN: 14 mg/dL (ref 8–23)
CO2: 24 mmol/L (ref 22–32)
Calcium: 8.2 mg/dL — ABNORMAL LOW (ref 8.9–10.3)
Chloride: 105 mmol/L (ref 98–111)
Creatinine, Ser: 0.94 mg/dL (ref 0.44–1.00)
GFR calc Af Amer: >60 mL/min (ref >60)
GFR calc non Af Amer: 52 mL/min — ABNORMAL LOW (ref >60)
Glucose, Bld: 103 mg/dL — ABNORMAL HIGH (ref 70–99)
Potassium: 3.5 mmol/L (ref 3.5–5.1)
Sodium: 138 mmol/L (ref 135–145)

## 2019-09-07 LAB — CBC
HCT: 24.6 % — ABNORMAL LOW (ref 36.0–46.0)
Hemoglobin: 7.8 g/dL — ABNORMAL LOW (ref 12.0–15.0)
MCH: 31.1 pg (ref 26.0–34.0)
MCHC: 31.7 g/dL (ref 30.0–36.0)
MCV: 98 fL (ref 80.0–100.0)
Platelets: 146 10*3/uL — ABNORMAL LOW (ref 150–400)
RBC: 2.51 MIL/uL — ABNORMAL LOW (ref 3.87–5.11)
RDW: 12.4 % (ref 11.5–15.5)
WBC: 10.2 10*3/uL (ref 4.0–10.5)
nRBC: 0.3 % — ABNORMAL HIGH (ref 0.0–0.2)

## 2019-09-07 NOTE — Consult Note (Signed)
Cardiology Consultation:   Patient ID: [name] MRN: [id number]; DOB: 1923-08-19  Admit date: 09/04/2019 Date of Consult: 09/07/2019  Primary Care Provider: Merrilee Seashore, MD     Patient Profile:   Julia Robbins is a 85 y.o. female with a hx of aorticstenosis and GI bleeding who is being seen today for preoperative assessment at the request of Dr Ashby Dawes and Romero Belling.  History of Present Illness:   Julia Robbins was admitted from home where she lives independently with GI bleeding because of both per rectum and with hematemesis.  Hypotensive with acute renal insufficiency on admission improved with hydration.  Initial evaluation has identified an umbilicated mass in the stomach for which surgical excision is being considered.  This is felt to be the cause of her bleeding not withstanding the fact that she has lower GI bleeding sources as well.  Initial hemoglobin was 11 but has been as low as 7.4.  She has chronic anemia treated with iron replacement since the time of her colectomy  She has a history of colon cancer with prior colectomy and chemotherapy in 1988.  Prior history of diverticular bleeds.  History of DVT for which an IVC filter was placed; she is not anticoagulated.  Echocardiogram 12/20 LVEF 40-45% with moderate aortic stenosis and aortic regurgitation (mean gradient 23/visualized area 0.88 cm)  She has a history of dyspnea on exertion.  This is been progressive over the last month now is less than 10-20 feet but was noted before Covid at the grocery store..  She also has a history of exertional chest discomfort.  No peripheral edema.  Some palpitations but no tachypalpitations.  No syncope.  Heart Pathway Score:     Past Medical History:  Diagnosis Date  . Abnormal EKG    hx left bundle branch block dating back to 2012 on old ekg's  . Arthritis    degenerative.   . Cholelithiasis 2010   15 mm CBD on CT scan 2010.   Marland Kitchen Colon cancer ([facility]) 1998   chemo and  colectomy.  T3 N1 stage 3, 2/15 nodes +.   . DVT (deep venous thrombosis) ([facility])    right leg  . Elevated cholesterol   . Family history of adverse reaction to anesthesia    daughter has post op n/v  . Gastric ulcer 1998  . [place] filter in place 1998   for DVT. right leg  . HOH (hard of hearing)    slight    Past Surgical History:  Procedure Laterality Date  . COLON SURGERY  1998  . COLONOSCOPY WITH PROPOFOL N/A 04/12/2017   Procedure: COLONOSCOPY WITH PROPOFOL;  Surgeon: Milus Banister, MD;  Location: [facility];  Service: Endoscopy;  Laterality: N/A;  . DILATION AND CURETTAGE OF UTERUS  yrs ago  . ESOPHAGOGASTRODUODENOSCOPY (EGD) WITH PROPOFOL N/A 03/08/2017   Procedure: ESOPHAGOGASTRODUODENOSCOPY (EGD) WITH PROPOFOL;  Surgeon: Milus Banister, MD;  Location: [facility];  Service: Endoscopy;  Laterality: N/A;     No current facility-administered medications on file prior to encounter.    Current Outpatient Medications on File Prior to Encounter  Medication Sig Dispense Refill  . acetaminophen (TYLENOL) 500 MG tablet Take 1,000 mg by mouth 2 (two) times daily as needed for moderate pain or headache.    . Cholecalciferol (VITAMIN D-3) 1000 units CAPS Take 1 tablet by mouth daily.    . cyclobenzaprine (FLEXERIL) 10 MG tablet Take 10 mg by mouth 2 (two) times daily as needed for muscle  spasms.     Marland Kitchen donepezil (ARICEPT) 10 MG tablet Take 10 mg by mouth at bedtime.    . ferrous sulfate 325 (65 FE) MG tablet Take 325 mg by mouth daily with breakfast.     . furosemide (LASIX) 40 MG tablet Take 40 mg by mouth daily.     . Multiple Vitamin (MULTIVITAMIN) capsule Take 1 capsule by mouth daily.    . nortriptyline (PAMELOR) 10 MG capsule Take 10 mg by mouth 2 (two) times daily.    . potassium chloride SA (K-DUR,KLOR-CON) 20 MEQ tablet Take 20 mEq by mouth 2 (two) times daily.     . hyoscyamine (LEVSIN SL) 0.125 MG SL tablet Place 1 tablet (0.125 mg total) under the tongue every 4  (four) hours as needed. (Patient not taking: Reported on 09/04/2019) 60 tablet 3  . oxyCODONE-acetaminophen (PERCOCET) 5-325 MG per tablet Take 2 tablets by mouth every 4 (four) hours as needed for pain. (Patient not taking: Reported on 09/04/2019) 10 tablet 0      Inpatient Medications: Scheduled Meds: . Chlorhexidine Gluconate Cloth  6 each Topical Daily  . nortriptyline  10 mg Oral QHS  . pantoprazole (PROTONIX) IV  40 mg Intravenous Q12H  . sucralfate  1 g Oral TID WC & HS   Continuous Infusions: . lactated ringers 20 mL/hr at 09/06/19 1403   PRN Meds: acetaminophen **OR** acetaminophen, fentaNYL (SUBLIMAZE) injection, influenza vaccine adjuvanted, ondansetron (ZOFRAN) IV  Allergies:   No Known Allergies  Social History:   Social History   Socioeconomic History  . Marital status: Widowed    Spouse name: Not on file  . Number of children: Not on file  . Years of education: Not on file  . Highest education level: Not on file  Occupational History  . Not on file  Social Needs  . Financial resource strain: Not on file  . Food insecurity    Worry: Not on file    Inability: Not on file  . Transportation needs    Medical: Not on file    Non-medical: Not on file  Tobacco Use  . Smoking status: Never Smoker  . Smokeless tobacco: Never Used  Substance and Sexual Activity  . Alcohol use: No  . Drug use: No  . Sexual activity: Never  Lifestyle  . Physical activity    Days per week: Not on file    Minutes per session: Not on file  . Stress: Not on file  Relationships  . Social Herbalist on phone: Not on file    Gets together: Not on file    Attends religious service: Not on file    Active member of club or organization: Not on file    Attends meetings of clubs or organizations: Not on file    Relationship status: Not on file  . Intimate partner violence    Fear of current or ex partner: Not on file    Emotionally abused: Not on file    Physically abused:  Not on file    Forced sexual activity: Not on file  Other Topics Concern  . Not on file  Social History Narrative  . Not on file    Family History:     Family History  Problem Relation Age of Onset  . Heart disease Mother   . Colon cancer Neg Hx   . Stomach cancer Neg Hx   . Esophageal cancer Neg Hx   . Pancreatic cancer Neg Hx   .  Liver disease Neg Hx      ROS:  Please see the history of present illness.    All other ROS reviewed and negative.     Physical Exam/Data:   Vitals:   09/07/19 0129 09/07/19 0348 09/07/19 0400 09/07/19 0800  BP: 104/65  133/70 (!) 104/46  Pulse: (!) 106     Resp: 19  (!) 23 19  Temp:  99.9 F (37.7 C)  98.8 F (37.1 C)  TempSrc:  Oral  Oral  SpO2: 100%  100%   Weight:      Height:        Intake/Output Summary (Last 24 hours) at 09/07/2019 0827 Last data filed at 09/07/2019 0600 Gross per 24 hour  Intake 541.09 ml  Output 671 ml  Net -129.91 ml   Last 3 Weights 09/06/2019 09/05/2019 09/05/2019  Weight (lbs) 157 lb 13.6 oz 152 lb 1.9 oz 152 lb 1.9 oz  Weight (kg) 71.6 kg 69 kg 69 kg     Body mass index is 26.27 kg/m.  General:  Well nourished, well developed, in no acute distress appearing to be a vintage member of the [employer] HEENT: normal Lymph: no adenopathy Neck: no JVD  Carotids are delayed with a transmitted murmur Vascular:  ; FA pulses 2+ bilaterally without bruits  Cardiac:  normal S1, single S2; RRR; 3/6 systolic murmur at the right upper sternal border Lungs:  clear to auscultation bilaterally, no wheezing, rhonchi or rales  Abd: soft, nontender, no hepatomegaly  Ext: no edema Musculoskeletal:  No deformities, BUE and BLE strength normal and equal Skin: warm and dry  Neuro:  CNs 2-12 intact, no focal abnormalities noted Psych:  Normal affect   EKG:  The EKG was personally reviewed from 2017 demonstrates sinus tachycardia with left bundle branch block    12/6 sinus @  98 16/14/41 LAD LBBB Telemetry:   Telemetry was personally reviewed and demonstrates: Sinus rhythm  Relevant CV Studies: Echo As above   Laboratory Data:  High Sensitivity Troponin:  No results for input(s): TROPONINIHS in the last 720 hours.   Chemistry Recent Labs  Lab 09/05/19 0225 09/06/19 0713 09/07/19 0238  NA 142 145 138  K 3.8 3.4* 3.5  CL 108 112* 105  CO2 21* 24 24  GLUCOSE 105* 79 103*  BUN 52* 25* 14  CREATININE 1.34* 1.04* 0.94  CALCIUM 8.5* 8.4* 8.2*  GFRNONAA 34* 46* 52*  GFRAA 39* 53* >60  ANIONGAP 13 9 9     Recent Labs  Lab 09/04/19 1928 09/05/19 0225  PROT 7.4 6.7  ALBUMIN 3.4* 3.2*  AST 21 16  ALT 13 12  ALKPHOS 43 39  BILITOT 0.7 0.3   Hematology Recent Labs  Lab 09/06/19 0713 09/06/19 0918 09/07/19 0238  WBC 7.5 7.1 10.2  RBC 2.41* 2.35* 2.51*  HGB 7.4* 7.5* 7.8*  HCT 24.5* 23.8* 24.6*  MCV 101.7* 101.3* 98.0  MCH 30.7 31.9 31.1  MCHC 30.2 31.5 31.7  RDW 12.7 12.7 12.4  PLT 127* 135* 146*   BNPNo results for input(s): BNP, PROBNP in the last 168 hours.  DDimer No results for input(s): DDIMER in the last 168 hours.   Radiology/Studies:  Ct Abdomen Pelvis Wo Contrast  Result Date: 09/04/2019 CLINICAL DATA:  Right lower quadrant pain. Right red blood per rectum. EXAM: CT ABDOMEN AND PELVIS WITHOUT CONTRAST TECHNIQUE: Multidetector CT imaging of the abdomen and pelvis was performed following the standard protocol without IV contrast. COMPARISON:  02/14/2017 FINDINGS: Lower chest:  Scarring/fibrosis in the lung bases. No acute abnormality. Hepatobiliary: Gallstones fill the gallbladder. No focal hepatic abnormality. Pancreas: No focal abnormality or ductal dilatation. Spleen: No focal abnormality.  Normal size. Adrenals/Urinary Tract: No adrenal abnormality. No focal renal abnormality. No stones or hydronephrosis. Urinary bladder is unremarkable. Stomach/Bowel: Sigmoid diverticulosis. No active diverticulitis. Stomach and small bowel decompressed. No bowel obstruction.  Vascular/Lymphatic: Aortic atherosclerosis. No enlarged abdominal or pelvic lymph nodes. IVC filter in place. Reproductive: Uterus and adnexa unremarkable.  No mass. Other: No free fluid or free air. Musculoskeletal: No acute bony abnormality. IMPRESSION: Sigmoid diverticulosis.  No active diverticulitis. Cholelithiasis. Aortic atherosclerosis. No acute findings. Electronically Signed   By: Rolm Baptise M.D.   On: 09/04/2019 20:43   US Abdomen Limited  Result Date: 09/05/2019 CLINICAL DATA:  Right upper quadrant abdominal pain for 1 day. EXAM: ULTRASOUND ABDOMEN LIMITED RIGHT UPPER QUADRANT COMPARISON:  CT of the abdomen and pelvis without contrast 09/04/2019 FINDINGS: Gallbladder: Wall echo complex is present with multiple known large stones. Gallbladder wall thickness is within normal limits at 1.9 mm. There is no sonographic Murphy's sign. Common bile duct: Diameter: The common bile duct is markedly dilated, measuring 17.5 cm. No obstructing mass lesion is present. Liver: Rolled the liver is diffusely echogenic. No focal lesions are present. Portal vein is patent on color Doppler imaging with normal direction of blood flow towards the liver. Other: None. IMPRESSION: 1. Dilated common bile duct without obstructing mass lesion. No intrahepatic biliary dilation is evident. 2. Large gallstones without evidence for acute cholecystitis 3. Hepatic steatosis.  No discrete hepatic lesions. Electronically Signed   By: San Morelle M.D.   On: 09/05/2019 06:02    Assessment and Plan:   1.  GI bleeding with a gastric mass 2. Preoperative assessment 3. Cardiomyopathy-mild 4. Anemia  5. Aortic stenosis at least moderate 6. LBBB 7. Angina/DOE  8. Colon Cancer remote, diverticulitis   The patient had severe GI bleeding.  It is ascribed to a gastric mass; this is presumed to be the cause of both the upper and lower GI bleeding I guess.  She has aortic stenosis of at least moderate degree.  She has  significant cardiac limitations related to her aortic stenosis namely dyspnea and angina; these have worsened considerably in the last month which may be related to the progressive anemia but antedate Covid validating their relevance.  This would inform at least a moderate increased cardiovascular risk.  Studies are variable in identifying whether severe or less severe aortic stenosis have similar risks; however, attendant angina and dyspnea identify a still higher risk group.  In the event that such patients are undergoing elective surgery, catheterization is recommended; however, in her case, the only thing that we would find that would be relevant in reducing risk of surgery would be stenosis that would be treated requiring attendant antiplatelet therapy.  This potentially augments the risk of recurrent bleeding situation.  I reviewed this with her and her daughter.  The patient herself is quite averse to the idea of any surgery.  Her daughter's concerns are related to her mother's quality of life.  In the event that surgery is undertaken, hemodynamic intraoperative monitoring is recommended by some.   We will follow with you.  Thank you for the consultation        For questions or updates, please contact [place] Please consult [email or web address] for contact info under     Signed, Virl Axe, MD  09/07/2019 8:27 AM

## 2019-09-07 NOTE — Progress Notes (Signed)
[facility]                                                                              Patient Demographics  Julia Robbins, is a 85 y.o. female, DOB - [birth year], [id number]  Admit date - 09/04/2019   Admitting Physician Neena Rhymes, MD  Outpatient Primary MD for the patient is Merrilee Seashore, MD  Outpatient specialists:   LOS - 3  days   Medical records reviewed and are as summarized below:    No chief complaint on file.      Brief summary   85 y.o.femalewith medical history significant ofh/o colon cancer s/p colectomy and chemo in 1998 (with several subsequent colonoscopies negative for recurrence, most recently 2018 by Dr. Ardis Hughs, [facility]), h/o diverticular bleed brought in for GI bleed evaluation. Patient also been c/o RUQ abdominal pain for several weeks. Daughter went to see her today and found BRB on garments and in toilet bowel. Patient reports throwing up dark coffee ground material.  ED Course:Patient was hypotensive on arrival to ED. She was administered IV Fluids with a good response. Initial lab revealed Hgb of 11g, Cr 1.96. CT abd/pelvis revealed diverticulosis and cholelithiasis otherwise negative. ED-MD consulted GI on call who recommend admission to step-down and CTA. 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  Hemoglobin was 11 on admission. -GI consulted and patient underwent EGD which showed nonbleeding Cameron ulcers associated with 5 cm hiatal hernia, small ulcerated centrally umbilicated mass in the cardia of the stomach, no active bleeding, felt likely to be the source of GI bleed. -No complaints, continue PPI, Carafate  -General surgery, GI following, awaiting cardiology clearance  prior to any surgical intervention.  -Consulted cardiology, cardiology will evaluate today, discussed with Dr. Caryl Comes on 12/5 -H&H 7.8 today, no bleeding overnight.  - 2D echo done for cardiac clearance showed EF of 40 to 45%, mild to moderate decreased LV function, grade 1 diastolic dysfunction  Acute kidney injury with lactic acidosis -Continue to hold Lasix, creatinine was 1.76 at the time of admission -Creatinine stable, 0.9 today   Hypokalemia -Stable, 3.5 today  Right upper quadrant pain, intermittent -Abdominal ultrasound showed dilated common bile duct without obstructing mass, no intrahepatic biliary dilatation.  Large gallstones without evidence for acute cholecystitis, hepatic steatosis -GI, general surgery on board, follow recommendations   History of colon CA -Status post colectomy, chemo in 1998 with several subsequent colonoscopies negative for recurrence most recently by Dr. Ardis Hughs in 2018.  History of DVT Status post IVC filter not on any anticoagulation outpatient  Hypertension, history of grade 1 diastolic dysfunction -Continue to hold Lasix, BP has been soft -Compensated  Cognitive impairment -Currently appears to be at baseline   Code Status: Full CODE STATUS DVT Prophylaxis:   SCD's Family Communication: Discussed all imaging results, lab results, explained to the patient's daughter   Disposition Plan: Remains inpatient until further surgical intervention  Time Spent in minutes   25 minutes  Procedures:  EGD  Consultants:   Gastroenterology General surgery Cardiology  Antimicrobials:   Anti-infectives (From admission, onward)   None         Medications  Scheduled Meds: . Chlorhexidine Gluconate Cloth  6 each Topical Daily  . nortriptyline  10 mg Oral QHS  . pantoprazole (PROTONIX) IV  40 mg Intravenous Q12H  . sucralfate  1 g Oral TID WC & HS   Continuous Infusions: . lactated ringers 20 mL/hr at 09/07/19 0923   PRN  Meds:.acetaminophen **OR** acetaminophen, fentaNYL (SUBLIMAZE) injection, influenza vaccine adjuvanted, ondansetron (ZOFRAN) IV      Subjective:   Sherita Pickell was seen and examined today.  No acute complaints, BP still soft.  No further rectal bleeding.  No nausea or vomiting.   Denies any chest pain or shortness of breath. Patient denies dizziness, new focal weakness, numbess, tingling. No acute events overnight.  Generalized weakness+  Objective:   Vitals:   09/07/19 0129 09/07/19 0348 09/07/19 0400 09/07/19 0800  BP: 104/65  133/70 (!) 104/46  Pulse: (!) 106     Resp: 19  (!) 23 19  Temp:  99.9 F (37.7 C)  98.8 F (37.1 C)  TempSrc:  Oral  Oral  SpO2: 100%  100%   Weight:      Height:        Intake/Output Summary (Last 24 hours) at 09/07/2019 0941 Last data filed at 09/07/2019 [id number] Gross per 24 hour  Intake 927.86 ml  Output 671 ml  Net 256.86 ml     Wt Readings from Last 3 Encounters:  09/06/19 71.6 kg  04/12/17 77.1 kg  02/28/17 77.5 kg    Physical Exam  General: Alert and oriented x 3, NAD, pleasant  Eyes:   HEENT:  Atraumatic, normocephalic  Cardiovascular: S1 S2 clear, no murmurs, RRR. No pedal edema b/l  Respiratory: CTAB, no wheezing, rales or rhonchi  Gastrointestinal: Soft, nontender, nondistended, NBS  Ext: no pedal edema bilaterally  Neuro: no new deficits  Musculoskeletal: No cyanosis, clubbing  Skin: No rashes  Psych: Normal affect and demeanor, alert and oriented x3    Data Reviewed:  I have personally reviewed following labs and imaging studies  Micro Results Recent Results (from the past 240 hour(s))  SARS CORONAVIRUS 2 (TAT 6-24 HRS) Nasopharyngeal Nasopharyngeal Swab     Status: None   Collection Time: 09/04/19  9:32 PM   Specimen: Nasopharyngeal Swab  Result Value Ref Range Status   SARS Coronavirus 2 NEGATIVE NEGATIVE Final    Comment: (NOTE) SARS-CoV-2 target nucleic acids are NOT DETECTED. The SARS-CoV-2 RNA is  generally detectable in upper and lower respiratory specimens during the acute phase of infection. Negative results do not preclude SARS-CoV-2 infection, do not rule out co-infections with other pathogens, and should not be used as the sole basis for treatment or other patient management decisions. Negative results must be combined with clinical observations, patient history, and epidemiological information. The expected result is Negative. Fact Sheet for Patients: SugarRoll.be Fact Sheet for Healthcare Providers: https://www.woods-mathews.com/ This test is not yet approved or cleared by the [place] FDA and  has been authorized for detection and/or diagnosis of SARS-CoV-2 by FDA under an Emergency Use Authorization (EUA). This EUA will remain  in effect (meaning this test can be used) for the duration of the COVID-19 declaration under Section 56 4(b)(1) of the Act, 21 U.S.C. section 360bbb-3(b)(1), unless the authorization is terminated or revoked  sooner. Performed at [facility], [place] [address], [place], [place] [address]   MRSA PCR Screening     Status: None   Collection Time: 09/04/19 10:06 PM   Specimen: Nasopharyngeal  Result Value Ref Range Status   MRSA by PCR NEGATIVE NEGATIVE Final    Comment:        The GeneXpert MRSA Assay (FDA approved for NASAL specimens only), is one component of a comprehensive MRSA colonization surveillance program. It is not intended to diagnose MRSA infection nor to guide or monitor treatment for MRSA infections. Performed at [facility], [place] [address], [place], [place] [address]     Radiology Reports Ct Abdomen Pelvis Wo Contrast  Result Date: 09/04/2019 CLINICAL DATA:  Right lower quadrant pain. Right red blood per rectum. EXAM: CT ABDOMEN AND PELVIS WITHOUT CONTRAST TECHNIQUE: Multidetector CT imaging of the abdomen and pelvis was performed following the  standard protocol without IV contrast. COMPARISON:  02/14/2017 FINDINGS: Lower chest: Scarring/fibrosis in the lung bases. No acute abnormality. Hepatobiliary: Gallstones fill the gallbladder. No focal hepatic abnormality. Pancreas: No focal abnormality or ductal dilatation. Spleen: No focal abnormality.  Normal size. Adrenals/Urinary Tract: No adrenal abnormality. No focal renal abnormality. No stones or hydronephrosis. Urinary bladder is unremarkable. Stomach/Bowel: Sigmoid diverticulosis. No active diverticulitis. Stomach and small bowel decompressed. No bowel obstruction. Vascular/Lymphatic: Aortic atherosclerosis. No enlarged abdominal or pelvic lymph nodes. IVC filter in place. Reproductive: Uterus and adnexa unremarkable.  No mass. Other: No free fluid or free air. Musculoskeletal: No acute bony abnormality. IMPRESSION: Sigmoid diverticulosis.  No active diverticulitis. Cholelithiasis. Aortic atherosclerosis. No acute findings. Electronically Signed   By: Rolm Baptise M.D.   On: 09/04/2019 20:43   US Abdomen Limited  Result Date: 09/05/2019 CLINICAL DATA:  Right upper quadrant abdominal pain for 1 day. EXAM: ULTRASOUND ABDOMEN LIMITED RIGHT UPPER QUADRANT COMPARISON:  CT of the abdomen and pelvis without contrast 09/04/2019 FINDINGS: Gallbladder: Wall echo complex is present with multiple known large stones. Gallbladder wall thickness is within normal limits at 1.9 mm. There is no sonographic Murphy's sign. Common bile duct: Diameter: The common bile duct is markedly dilated, measuring 17.5 cm. No obstructing mass lesion is present. Liver: Rolled the liver is diffusely echogenic. No focal lesions are present. Portal vein is patent on color Doppler imaging with normal direction of blood flow towards the liver. Other: None. IMPRESSION: 1. Dilated common bile duct without obstructing mass lesion. No intrahepatic biliary dilation is evident. 2. Large gallstones without evidence for acute cholecystitis 3.  Hepatic steatosis.  No discrete hepatic lesions. Electronically Signed   By: San Morelle M.D.   On: 09/05/2019 06:02    Lab Data:  CBC: Recent Labs  Lab 09/04/19 1928 09/05/19 0225 09/05/19 1204 09/06/19 0713 09/06/19 0918 09/07/19 0238  WBC 11.0* 11.3*  --  7.5 7.1 10.2  NEUTROABS  --   --   --   --  4.1  --   HGB 11.0* 10.1* 9.0* 7.4* 7.5* 7.8*  HCT 33.3* 31.8*  --  24.5* 23.8* 24.6*  MCV 95.7 97.8  --  101.7* 101.3* 98.0  PLT 227 186  --  127* 135* [id number]*   Basic Metabolic Panel: Recent Labs  Lab 09/04/19 1928 09/05/19 0225 09/06/19 0713 09/07/19 0238  NA 140 142 145 138  K 4.6 3.8 3.4* 3.5  CL 105 108 112* 105  CO2 22 21* 24 24  GLUCOSE 126* 105* 79 103*  BUN 55* 52* 25* 14  CREATININE 1.76* 1.34* 1.04* 0.94  CALCIUM 9.3 8.5* 8.4* 8.2*   GFR: Estimated Creatinine Clearance: 36.3 mL/min (by C-G formula based on SCr of 0.94 mg/dL). Liver Function Tests: Recent Labs  Lab 09/04/19 1928 09/05/19 0225  AST 21 16  ALT 13 12  ALKPHOS 43 39  BILITOT 0.7 0.3  PROT 7.4 6.7  ALBUMIN 3.4* 3.2*   Recent Labs  Lab 09/04/19 2228  LIPASE 21   No results for input(s): AMMONIA in the last 168 hours. Coagulation Profile: Recent Labs  Lab 09/04/19 1928  INR 1.2   Cardiac Enzymes: No results for input(s): CKTOTAL, CKMB, CKMBINDEX, TROPONINI in the last 168 hours. BNP (last 3 results) No results for input(s): PROBNP in the last 8760 hours. HbA1C: No results for input(s): HGBA1C in the last 72 hours. CBG: No results for input(s): GLUCAP in the last 168 hours. Lipid Profile: No results for input(s): CHOL, HDL, LDLCALC, TRIG, CHOLHDL, LDLDIRECT in the last 72 hours. Thyroid Function Tests: No results for input(s): TSH, T4TOTAL, FREET4, T3FREE, THYROIDAB in the last 72 hours. Anemia Panel: No results for input(s): VITAMINB12, FOLATE, FERRITIN, TIBC, IRON, RETICCTPCT in the last 72 hours. Urine analysis:    Component Value Date/Time   COLORURINE AMBER  BIOCHEMICALS MAY BE AFFECTED BY COLOR (A) 10/04/2008 1330   APPEARANCEUR CLEAR 10/04/2008 1330   LABSPEC 1.038 (H) 10/04/2008 1330   PHURINE 5.0 10/04/2008 1330   GLUCOSEU NEGATIVE 10/04/2008 1330   HGBUR NEGATIVE 10/04/2008 1330   BILIRUBINUR SMALL (A) 10/04/2008 1330   KETONESUR 15 (A) 10/04/2008 1330   PROTEINUR 30 (A) 10/04/2008 1330   UROBILINOGEN 0.2 10/04/2008 1330   NITRITE NEGATIVE 10/04/2008 1330   LEUKOCYTESUR NEGATIVE 10/04/2008 1330     Vesta Wheeland M.D. [facility] 09/07/2019, 9:41 AM   Call night coverage person covering after 7pm

## 2019-09-07 NOTE — Progress Notes (Signed)
Subjective No acute events - hgb appears to have stabilized. Echo completed yesterday. No reported BMs overnight. Denies any complaints.  Objective: Vital signs in last 24 hours: Temp:  [97.8 F (36.6 C)-101.7 F (38.7 C)] 99.9 F (37.7 C) (12/06 0348) Pulse Rate:  [102-106] 106 (12/06 0129) Resp:  [19-30] 23 (12/06 0400) BP: (104-144)/(60-118) 133/70 (12/06 0400) SpO2:  [97 %-100 %] 100 % (12/06 0400) Last BM Date: 09/04/19  Intake/Output from previous day: 12/05 0701 - 12/06 0700 In: 677.6 [I.V.:387.1; IV Piggyback:290.5] Out: 721 [Urine:721] Intake/Output this shift: No intake/output data recorded.  Gen: NAD, comfortable CV: RRR Pulm: Normal work of breathing Abd: Soft, NT/ND Ext: SCDs in place  Lab Results: CBC  Recent Labs    09/06/19 0918 09/07/19 0238  WBC 7.1 10.2  HGB 7.5* 7.8*  HCT 23.8* 24.6*  PLT 135* 146*   BMET Recent Labs    09/06/19 0713 09/07/19 0238  NA 145 138  K 3.4* 3.5  CL 112* 105  CO2 24 24  GLUCOSE 79 103*  BUN 25* 14  CREATININE 1.04* 0.94  CALCIUM 8.4* 8.2*   PT/INR Recent Labs    09/04/19 1928  LABPROT 14.8  INR 1.2   ABG No results for input(s): PHART, HCO3 in the last 72 hours.  Invalid input(s): PCO2, PO2  Studies/Results:  Anti-infectives: Anti-infectives (From admission, onward)   None       Assessment/Plan: Patient Active Problem List   Diagnosis Date Noted  . Acute lower GI bleeding 09/04/2019  . Hematemesis 09/04/2019  . Cholelithiasis 09/04/2019  . Abnormal abdominal CT scan   . RLQ abdominal pain   . Acute blood loss anemia 12/02/2015  . CKD (chronic kidney disease) stage 3, GFR 30-59 ml/min 12/02/2015  . Upper GI bleed 12/01/2015  . Acute kidney injury ([place]) 12/01/2015  . Elevated lactic acid level 12/01/2015  . Essential hypertension 12/01/2015  . History of DVT (deep vein thrombosis) 10/31/2012  . Hyperlipidemia 10/31/2012  . History of colon cancer 03/29/2010   s/p  Procedure(s): ESOPHAGOGASTRODUODENOSCOPY (EGD) WITH PROPOFOL 09/05/2019  -Hgb 7.8 & stable x 24hrs (from 7.4<0--7.5<--9<--10.1). Continue to monitor, transfuse if <7 -Cardiology evaluation for preop/clearance given advanced age - noted echo done yesterday -I had spoken at length on the phone with her daughter, Olin Hauser, yesterday. Goals of care will be to prevent further bleeding if she is not prohibitive risk as this has interfered with quality of life. She lives alone and is independent. If surgery were to likely derail things and lead to significant loss of independence, would elect to observe. We discussed this is certainly possible seeing as she is 85 years old, has had prior open colectomy, need for potential open surgery this go round as well. -No decisions need to be made today per se - will await cardiology evaluation as well -Will revisit things tomorrow -ACS surgical risk calculator:   LOS: 3 days   Sharon Mt. Dema Severin, M.D. [facility] Use [email or web address] to contact on call provider

## 2019-09-08 ENCOUNTER — Encounter (HOSPITAL_COMMUNITY): Payer: Self-pay | Admitting: Gastroenterology

## 2019-09-08 DIAGNOSIS — I351 Nonrheumatic aortic (valve) insufficiency: Secondary | ICD-10-CM

## 2019-09-08 DIAGNOSIS — D649 Anemia, unspecified: Secondary | ICD-10-CM

## 2019-09-08 DIAGNOSIS — I350 Nonrheumatic aortic (valve) stenosis: Secondary | ICD-10-CM

## 2019-09-08 DIAGNOSIS — I5041 Acute combined systolic (congestive) and diastolic (congestive) heart failure: Secondary | ICD-10-CM

## 2019-09-08 DIAGNOSIS — K922 Gastrointestinal hemorrhage, unspecified: Secondary | ICD-10-CM

## 2019-09-08 DIAGNOSIS — N183 Chronic kidney disease, stage 3 unspecified: Secondary | ICD-10-CM

## 2019-09-08 LAB — BASIC METABOLIC PANEL
Anion gap: 9 (ref 5–15)
BUN: 8 mg/dL (ref 8–23)
CO2: 24 mmol/L (ref 22–32)
Calcium: 8.2 mg/dL — ABNORMAL LOW (ref 8.9–10.3)
Chloride: 108 mmol/L (ref 98–111)
Creatinine, Ser: 0.84 mg/dL (ref 0.44–1.00)
GFR calc Af Amer: >60 mL/min (ref >60)
GFR calc non Af Amer: 59 mL/min — ABNORMAL LOW (ref >60)
Glucose, Bld: 110 mg/dL — ABNORMAL HIGH (ref 70–99)
Potassium: 3.4 mmol/L — ABNORMAL LOW (ref 3.5–5.1)
Sodium: 141 mmol/L (ref 135–145)

## 2019-09-08 LAB — CBC
HCT: 23.4 % — ABNORMAL LOW (ref 36.0–46.0)
HCT: 25.6 % — ABNORMAL LOW (ref 36.0–46.0)
HCT: 30.3 % — ABNORMAL LOW (ref 36.0–46.0)
Hemoglobin: 7.2 g/dL — ABNORMAL LOW (ref 12.0–15.0)
Hemoglobin: 8.2 g/dL — ABNORMAL LOW (ref 12.0–15.0)
Hemoglobin: 9.7 g/dL — ABNORMAL LOW (ref 12.0–15.0)
MCH: 30.5 pg (ref 26.0–34.0)
MCH: 31.9 pg (ref 26.0–34.0)
MCH: 31.3 pg (ref 26.0–34.0)
MCHC: 30.8 g/dL (ref 30.0–36.0)
MCHC: 32 g/dL (ref 30.0–36.0)
MCHC: 32 g/dL (ref 30.0–36.0)
MCV: 99.2 fL (ref 80.0–100.0)
MCV: 99.6 fL (ref 80.0–100.0)
MCV: 97.7 fL (ref 80.0–100.0)
Platelets: 144 10*3/uL — ABNORMAL LOW (ref 150–400)
Platelets: 174 10*3/uL (ref 150–400)
Platelets: 165 10*3/uL (ref 150–400)
RBC: 2.36 MIL/uL — ABNORMAL LOW (ref 3.87–5.11)
RBC: 2.57 MIL/uL — ABNORMAL LOW (ref 3.87–5.11)
RBC: 3.1 MIL/uL — ABNORMAL LOW (ref 3.87–5.11)
RDW: 12.9 % (ref 11.5–15.5)
RDW: 13.1 % (ref 11.5–15.5)
RDW: 13.5 % (ref 11.5–15.5)
WBC: 6.7 10*3/uL (ref 4.0–10.5)
WBC: 7.3 10*3/uL (ref 4.0–10.5)
WBC: 7.8 10*3/uL (ref 4.0–10.5)
nRBC: 0 % (ref 0.0–0.2)
nRBC: 0 % (ref 0.0–0.2)
nRBC: 0 % (ref 0.0–0.2)

## 2019-09-08 LAB — PREPARE RBC (CROSSMATCH)

## 2019-09-08 MED ORDER — POTASSIUM CHLORIDE 20 MEQ/15ML (10%) PO SOLN
20.0000 meq | ORAL | Status: DC
  Administered 2019-09-08: 20 meq
  Filled 2019-09-08: qty 15

## 2019-09-08 MED ORDER — SODIUM CHLORIDE 0.9% IV SOLUTION
Freq: Once | INTRAVENOUS | Status: AC
  Administered 2019-09-08: 17:00:00 via INTRAVENOUS

## 2019-09-08 MED ORDER — POTASSIUM CHLORIDE 10 MEQ/100ML IV SOLN
10.0000 meq | INTRAVENOUS | Status: AC
  Administered 2019-09-08 (×3): 10 meq via INTRAVENOUS
  Filled 2019-09-08 (×3): qty 100

## 2019-09-08 NOTE — Progress Notes (Signed)
[facility]                                                                              Patient Demographics  Julia Robbins, is a 85 y.o. female, DOB - 10/07/1922, [id number]  Admit date - 09/04/2019   Admitting Physician Neena Rhymes, MD  Outpatient Primary MD for the patient is Merrilee Seashore, MD  Outpatient specialists:   LOS - 4  days   Medical records reviewed and are as summarized below:    No chief complaint on file.      Brief summary   85 y.o.femalewith medical history significant ofh/o colon cancer s/p colectomy and chemo in 1998 (with several subsequent colonoscopies negative for recurrence, most recently 2018 by Dr. Ardis Hughs, [facility]), h/o diverticular bleed brought in for GI bleed evaluation. Patient also been c/o RUQ abdominal pain for several weeks. Daughter went to see her today and found BRB on garments and in toilet bowel. Patient reports throwing up dark coffee ground material.  ED Course:Patient was hypotensive on arrival to ED. She was administered IV Fluids with a good response. Initial lab revealed Hgb of 11g, Cr 1.96. CT abd/pelvis revealed diverticulosis and cholelithiasis otherwise negative. ED-MD consulted GI on call who recommend admission to step-down and CTA. 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  Hemoglobin was 11 on admission. -GI consulted and patient underwent EGD which showed nonbleeding Cameron ulcers associated with 5 cm hiatal hernia, small ulcerated centrally umbilicated mass in the cardia of the stomach, no active bleeding, felt likely to be the source of GI bleed. -General surgery, GI following  -H&H 7.2 today, discussed with RN, no bleeding overnight, had 2 brown  stools.  Transfuse 1 unit packed RBCs to keep hemoglobin above 7.5 .  - 2D echo done for cardiac clearance showed EF of 40 to 45%, mild to moderate decreased LV function, grade 1 diastolic dysfunction -Seen by cardiology, higher risk for proceeding with surgery however proceed with close hemodynamic intraoperative and postoperative monitoring.  Progressive dyspnea secondary to aortic stenosis which places her at moderate risk. -General surgery following, who discussed with the patient and she would like to avoid surgery this admission and will follow up outpatient. -Advance diet today to soft solids  Acute kidney injury with lactic acidosis -Continue to hold Lasix, creatinine was 1.76 at the time of admission -Creatinine stable   Hypokalemia -Potassium 3.4, replaced  Right upper quadrant pain, intermittent -Abdominal ultrasound showed dilated common bile duct without obstructing mass, no intrahepatic biliary dilatation.  Large gallstones without evidence for acute cholecystitis, hepatic steatosis -GI, general surgery on board, follow recommendations   History of colon CA -Status post colectomy, chemo in 1998 with several subsequent colonoscopies negative for recurrence most recently by Dr. Ardis Hughs in 2018.  History of DVT Status post IVC filter not on any anticoagulation outpatient  Hypertension, history of grade 1 diastolic dysfunction -Continue to hold  Lasix, BP has been soft -Currently stable, compensated  Cognitive impairment -Currently appears to be at baseline   Code Status: Full CODE STATUS DVT Prophylaxis:   SCD's Family Communication: Discussed all imaging results, lab results, explained to the patient's daughter   Disposition Plan: If no surgery is planned, tolerates diet, start PT for disposition and transfer to floor.  Time Spent in minutes   25 minutes  Procedures:  EGD  Consultants:   Gastroenterology General surgery Cardiology  Antimicrobials:    Anti-infectives (From admission, onward)   None         Medications  Scheduled Meds: . sodium chloride   Intravenous Once  . Chlorhexidine Gluconate Cloth  6 each Topical Daily  . nortriptyline  10 mg Oral QHS  . pantoprazole (PROTONIX) IV  40 mg Intravenous Q12H  . sucralfate  1 g Oral TID WC & HS   Continuous Infusions: . lactated ringers 20 mL/hr at 09/08/19 0300  . potassium chloride 10 mEq (09/08/19 1050)   PRN Meds:.acetaminophen **OR** acetaminophen, fentaNYL (SUBLIMAZE) injection, influenza vaccine adjuvanted, ondansetron (ZOFRAN) IV      Subjective:   Rexene Vanessen was seen and examined today.  Per patient, no acute complaints, had 2 BMs overnight, no bleeding.  Hemoglobin 7.2 today.  Has shortness of breath, no chest pain, nausea or vomiting. Patient denies dizziness, new focal weakness, numbess, tingling.  No acute events overnight.    Objective:   Vitals:   09/08/19 0000 09/08/19 0350 09/08/19 0430 09/08/19 0800  BP: (!) 119/47  (!) 123/50 132/66  Pulse:      Resp: (!) 26  (!) 27 (!) 21  Temp:  98.8 F (37.1 C)  98.4 F (36.9 C)  TempSrc:  Oral  Oral  SpO2: 100%  96%   Weight:      Height:        Intake/Output Summary (Last 24 hours) at 09/08/2019 1143 Last data filed at 09/08/2019 1049 Gross per 24 hour  Intake 343.5 ml  Output 1153 ml  Net -809.5 ml     Wt Readings from Last 3 Encounters:  09/06/19 71.6 kg  04/12/17 77.1 kg  02/28/17 77.5 kg    Physical Exam  General: Alert and oriented x 3, NAD  Eyes:   HEENT:    Cardiovascular: S1 S2 clear, no murmurs, RRR. No pedal edema b/l  Respiratory: CTAB, no wheezing, rales or rhonchi  Gastrointestinal: Soft, nontender, nondistended, NBS  Ext: no pedal edema bilaterally  Neuro: no new deficits  Musculoskeletal: No cyanosis, clubbing  Skin: No rashes  Psych: Normal affect and demeanor, alert and oriented x3     Data Reviewed:  I have personally reviewed following labs and  imaging studies  Micro Results Recent Results (from the past 240 hour(s))  SARS CORONAVIRUS 2 (TAT 6-24 HRS) Nasopharyngeal Nasopharyngeal Swab     Status: None   Collection Time: 09/04/19  9:32 PM   Specimen: Nasopharyngeal Swab  Result Value Ref Range Status   SARS Coronavirus 2 NEGATIVE NEGATIVE Final    Comment: (NOTE) SARS-CoV-2 target nucleic acids are NOT DETECTED. The SARS-CoV-2 RNA is generally detectable in upper and lower respiratory specimens during the acute phase of infection. Negative results do not preclude SARS-CoV-2 infection, do not rule out co-infections with other pathogens, and should not be used as the sole basis for treatment or other patient management decisions. Negative results must be combined with clinical observations, patient history, and epidemiological information. The expected result is Negative. Fact Sheet  for Patients: SugarRoll.be Fact Sheet for Healthcare Providers: https://www.woods-mathews.com/ This test is not yet approved or cleared by the [place] FDA and  has been authorized for detection and/or diagnosis of SARS-CoV-2 by FDA under an Emergency Use Authorization (EUA). This EUA will remain  in effect (meaning this test can be used) for the duration of the COVID-19 declaration under Section 56 4(b)(1) of the Act, 21 U.S.C. section 360bbb-3(b)(1), unless the authorization is terminated or revoked sooner. Performed at [facility], [place] [address] [place], [place] [address]   MRSA PCR Screening     Status: None   Collection Time: 09/04/19 10:06 PM   Specimen: Nasopharyngeal  Result Value Ref Range Status   MRSA by PCR NEGATIVE NEGATIVE Final    Comment:        The GeneXpert MRSA Assay (FDA approved for NASAL specimens only), is one component of a comprehensive MRSA colonization surveillance program. It is not intended to diagnose MRSA infection nor to guide or monitor treatment  for MRSA infections. Performed at [facility], [place] [address] [place], [place] [address]     Radiology Reports Ct Abdomen Pelvis Wo Contrast  Result Date: 09/04/2019 CLINICAL DATA:  Right lower quadrant pain. Right red blood per rectum. EXAM: CT ABDOMEN AND PELVIS WITHOUT CONTRAST TECHNIQUE: Multidetector CT imaging of the abdomen and pelvis was performed following the standard protocol without IV contrast. COMPARISON:  02/14/2017 FINDINGS: Lower chest: Scarring/fibrosis in the lung bases. No acute abnormality. Hepatobiliary: Gallstones fill the gallbladder. No focal hepatic abnormality. Pancreas: No focal abnormality or ductal dilatation. Spleen: No focal abnormality.  Normal size. Adrenals/Urinary Tract: No adrenal abnormality. No focal renal abnormality. No stones or hydronephrosis. Urinary bladder is unremarkable. Stomach/Bowel: Sigmoid diverticulosis. No active diverticulitis. Stomach and small bowel decompressed. No bowel obstruction. Vascular/Lymphatic: Aortic atherosclerosis. No enlarged abdominal or pelvic lymph nodes. IVC filter in place. Reproductive: Uterus and adnexa unremarkable.  No mass. Other: No free fluid or free air. Musculoskeletal: No acute bony abnormality. IMPRESSION: Sigmoid diverticulosis.  No active diverticulitis. Cholelithiasis. Aortic atherosclerosis. No acute findings. Electronically Signed   By: Rolm Baptise M.D.   On: 09/04/2019 20:43   US Abdomen Limited  Result Date: 09/05/2019 CLINICAL DATA:  Right upper quadrant abdominal pain for 1 day. EXAM: ULTRASOUND ABDOMEN LIMITED RIGHT UPPER QUADRANT COMPARISON:  CT of the abdomen and pelvis without contrast 09/04/2019 FINDINGS: Gallbladder: Wall echo complex is present with multiple known large stones. Gallbladder wall thickness is within normal limits at 1.9 mm. There is no sonographic Murphy's sign. Common bile duct: Diameter: The common bile duct is markedly dilated, measuring 17.5 cm. No obstructing  mass lesion is present. Liver: Rolled the liver is diffusely echogenic. No focal lesions are present. Portal vein is patent on color Doppler imaging with normal direction of blood flow towards the liver. Other: None. IMPRESSION: 1. Dilated common bile duct without obstructing mass lesion. No intrahepatic biliary dilation is evident. 2. Large gallstones without evidence for acute cholecystitis 3. Hepatic steatosis.  No discrete hepatic lesions. Electronically Signed   By: San Morelle M.D.   On: 09/05/2019 06:02    Lab Data:  CBC: Recent Labs  Lab 09/05/19 0225 09/05/19 1204 09/06/19 0713 09/06/19 0918 09/07/19 0238 09/08/19 0230  WBC 11.3*  --  7.5 7.1 10.2 6.7  NEUTROABS  --   --   --  4.1  --   --   HGB 10.1* 9.0* 7.4* 7.5* 7.8* 7.2*  HCT 31.8*  --  24.5* 23.8* 24.6* 23.4*  MCV 97.8  --  101.7* 101.3* 98.0 99.2  PLT 186  --  127* 135* 146* [id number]*   Basic Metabolic Panel: Recent Labs  Lab 09/04/19 1928 09/05/19 0225 09/06/19 0713 09/07/19 0238 09/08/19 0230  NA 140 142 145 138 141  K 4.6 3.8 3.4* 3.5 3.4*  CL 105 108 112* 105 108  CO2 22 21* 24 24 24   GLUCOSE 126* 105* 79 103* 110*  BUN 55* 52* 25* 14 8  CREATININE 1.76* 1.34* 1.04* 0.94 0.84  CALCIUM 9.3 8.5* 8.4* 8.2* 8.2*   GFR: Estimated Creatinine Clearance: 40.6 mL/min (by C-G formula based on SCr of 0.84 mg/dL). Liver Function Tests: Recent Labs  Lab 09/04/19 1928 09/05/19 0225  AST 21 16  ALT 13 12  ALKPHOS 43 39  BILITOT 0.7 0.3  PROT 7.4 6.7  ALBUMIN 3.4* 3.2*   Recent Labs  Lab 09/04/19 2228  LIPASE 21   No results for input(s): AMMONIA in the last 168 hours. Coagulation Profile: Recent Labs  Lab 09/04/19 1928  INR 1.2   Cardiac Enzymes: No results for input(s): CKTOTAL, CKMB, CKMBINDEX, TROPONINI in the last 168 hours. BNP (last 3 results) No results for input(s): PROBNP in the last 8760 hours. HbA1C: No results for input(s): HGBA1C in the last 72 hours. CBG: No results for  input(s): GLUCAP in the last 168 hours. Lipid Profile: No results for input(s): CHOL, HDL, LDLCALC, TRIG, CHOLHDL, LDLDIRECT in the last 72 hours. Thyroid Function Tests: No results for input(s): TSH, T4TOTAL, FREET4, T3FREE, THYROIDAB in the last 72 hours. Anemia Panel: No results for input(s): VITAMINB12, FOLATE, FERRITIN, TIBC, IRON, RETICCTPCT in the last 72 hours. Urine analysis:    Component Value Date/Time   COLORURINE AMBER BIOCHEMICALS MAY BE AFFECTED BY COLOR (A) 10/04/2008 1330   APPEARANCEUR CLEAR 10/04/2008 1330   LABSPEC 1.038 (H) 10/04/2008 1330   PHURINE 5.0 10/04/2008 1330   GLUCOSEU NEGATIVE 10/04/2008 1330   HGBUR NEGATIVE 10/04/2008 1330   BILIRUBINUR SMALL (A) 10/04/2008 1330   KETONESUR 15 (A) 10/04/2008 1330   PROTEINUR 30 (A) 10/04/2008 1330   UROBILINOGEN 0.2 10/04/2008 1330   NITRITE NEGATIVE 10/04/2008 1330   LEUKOCYTESUR NEGATIVE 10/04/2008 1330     Enjoli Tidd M.D. [facility] 09/08/2019, 11:43 AM   Call night coverage person covering after 7pm

## 2019-09-08 NOTE — Progress Notes (Signed)
3 Days Post-Op  Subjective: Patient with no complaints this am except burning in her arm from K runs.  She had a brown BM yesterday and is tolerating full liquids.  ROS: See above, otherwise other systems negative  Objective: Vital signs in last 24 hours: Temp:  [98.1 F (36.7 C)-98.9 F (37.2 C)] 98.8 F (37.1 C) (12/07 0350) Resp:  [15-27] 21 (12/07 0800) BP: (97-132)/(47-74) 132/66 (12/07 0800) SpO2:  [96 %-100 %] 96 % (12/07 0430) Last BM Date: 09/07/19  Intake/Output from previous day: 12/06 0701 - 12/07 0700 In: 630.3 [I.V.:630.3] Out: 1153 [Urine:1151; Stool:2] Intake/Output this shift: No intake/output data recorded.  PE: Heart: regular Lungs: CTAB Abd: soft, NT, ND, +BS  Lab Results:  Recent Labs    09/07/19 0238 09/08/19 0230  WBC 10.2 6.7  HGB 7.8* 7.2*  HCT 24.6* 23.4*  PLT 146* 144*   BMET Recent Labs    09/07/19 0238 09/08/19 0230  NA 138 141  K 3.5 3.4*  CL 105 108  CO2 24 24  GLUCOSE 103* 110*  BUN 14 8  CREATININE 0.94 0.84  CALCIUM 8.2* 8.2*   PT/INR No results for input(s): LABPROT, INR in the last 72 hours. CMP     Component Value Date/Time   NA 141 09/08/2019 0230   NA 141 04/15/2015 1012   K 3.4 (L) 09/08/2019 0230   K 4.2 04/15/2015 1012   CL 108 09/08/2019 0230   CO2 24 09/08/2019 0230   CO2 25 04/15/2015 1012   GLUCOSE 110 (H) 09/08/2019 0230   GLUCOSE 103 04/15/2015 1012   BUN 8 09/08/2019 0230   BUN 13.0 04/15/2015 1012   CREATININE 0.84 09/08/2019 0230   CREATININE 0.9 04/15/2015 1012   CALCIUM 8.2 (L) 09/08/2019 0230   CALCIUM 9.8 04/15/2015 1012   PROT 6.7 09/05/2019 0225   PROT 7.4 04/15/2015 1012   ALBUMIN 3.2 (L) 09/05/2019 0225   ALBUMIN 3.4 (L) 04/15/2015 1012   AST 16 09/05/2019 0225   AST 16 04/15/2015 1012   ALT 12 09/05/2019 0225   ALT 12 04/15/2015 1012   ALKPHOS 39 09/05/2019 0225   ALKPHOS 64 04/15/2015 1012   BILITOT 0.3 09/05/2019 0225   BILITOT 0.54 04/15/2015 1012   GFRNONAA 59 (L)  09/08/2019 0230   GFRAA >60 09/08/2019 0230   Lipase     Component Value Date/Time   LIPASE 21 09/04/2019 2228       Studies/Results: No results found.  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 -cards have seen the patient and states she is having some progressive dyspnea likely secondary to her aortic stenosis noted on echo and has been placed at moderate risk for surgery. -discussed options with the patient which include doing nothing about this, doing a big surgery this admission with a laparotomy, or me reaching out to our foregut surgeon's to see if this is something that they would do and possibly do robotically to minimize invasiveness.  We discussed that any surgery would almost certainly take away some if not all of her independence especially between a laparotomy vs lap/robotically. -she would like to avoid surgery this admission and reach out to our foregut surgeons for their opinion and possible outpatient follow up. -I have sent them a message.  We will see what they say. -otherwise diet  can be advanced as tolerated at this time.    FEN - FLD VTE - on hold due to UGI bleed ID - none   LOS: 4 days    Henreitta Cea , [facility] Surgery 09/08/2019, 8:48 AM Please see Amion for pager number during day hours 7:00am-4:30pm

## 2019-09-08 NOTE — Progress Notes (Addendum)
Progress Note  Patient Name: Julia Robbins Date of Encounter: 09/08/2019  Primary Cardiologist: New   Subjective   Pt doing well this AM. No reports of chest pain or SOB.  Inpatient Medications    Scheduled Meds: . sodium chloride   Intravenous Once  . Chlorhexidine Gluconate Cloth  6 each Topical Daily  . nortriptyline  10 mg Oral QHS  . pantoprazole (PROTONIX) IV  40 mg Intravenous Q12H  . sucralfate  1 g Oral TID WC & HS   Continuous Infusions: . lactated ringers 20 mL/hr at 09/08/19 0300  . potassium chloride     PRN Meds: acetaminophen **OR** acetaminophen, fentaNYL (SUBLIMAZE) injection, influenza vaccine adjuvanted, ondansetron (ZOFRAN) IV   Vital Signs    Vitals:   09/07/19 2350 09/08/19 0000 09/08/19 0350 09/08/19 0430  BP:  (!) 119/47  (!) 123/50  Pulse:      Resp:  (!) 26  (!) 27  Temp: 98.7 F (37.1 C)  98.8 F (37.1 C)   TempSrc: Oral  Oral   SpO2:  100%  96%  Weight:      Height:        Intake/Output Summary (Last 24 hours) at 09/08/2019 0713 Last data filed at 09/08/2019 0300 Gross per 24 hour  Intake 630.27 ml  Output 1153 ml  Net -522.73 ml   Filed Weights   09/05/19 0205 09/05/19 1427 09/06/19 0415  Weight: 69 kg 69 kg 71.6 kg    Physical Exam   General: Elderly, frail, NAD Neck: Negative for carotid bruits. No JVD Lungs:Clear to ausculation bilaterally. No wheezes, rales, or rhonchi. Breathing is unlabored. Cardiovascular: RRR with S1 S2. + murmurs Abdomen: Soft, non-tender, non-distended. No obvious abdominal masses. Extremities: No edema. No clubbing or cyanosis. DP pulses 2+ bilaterally Neuro: Alert and oriented. No focal deficits. No facial asymmetry. MAE spontaneously. Psych: Responds to questions appropriately with normal affect.    Labs    Chemistry Recent Labs  Lab 09/04/19 1928 09/05/19 0225 09/06/19 0713 09/07/19 0238 09/08/19 0230  NA 140 142 145 138 141  K 4.6 3.8 3.4* 3.5 3.4*  CL 105 108 112* 105 108   CO2 22 21* 24 24 24   GLUCOSE 126* 105* 79 103* 110*  BUN 55* 52* 25* 14 8  CREATININE 1.76* 1.34* 1.04* 0.94 0.84  CALCIUM 9.3 8.5* 8.4* 8.2* 8.2*  PROT 7.4 6.7  --   --   --   ALBUMIN 3.4* 3.2*  --   --   --   AST 21 16  --   --   --   ALT 13 12  --   --   --   ALKPHOS 43 39  --   --   --   BILITOT 0.7 0.3  --   --   --   GFRNONAA 24* 34* 46* 52* 59*  GFRAA 28* 39* 53* >60 >60  ANIONGAP 13 13 9 9 9      Hematology Recent Labs  Lab 09/06/19 0918 09/07/19 0238 09/08/19 0230  WBC 7.1 10.2 6.7  RBC 2.35* 2.51* 2.36*  HGB 7.5* 7.8* 7.2*  HCT 23.8* 24.6* 23.4*  MCV 101.3* 98.0 99.2  MCH 31.9 31.1 30.5  MCHC 31.5 31.7 30.8  RDW 12.7 12.4 12.9  PLT 135* 146* 144*    Cardiac EnzymesNo results for input(s): TROPONINI in the last 168 hours. No results for input(s): TROPIPOC in the last 168 hours.   BNPNo results for input(s): BNP, PROBNP in  the last 168 hours.   DDimer No results for input(s): DDIMER in the last 168 hours.   Radiology    No results found.  Telemetry    09/08/2019 NSR/ST 90, low 100's  - Personally Reviewed  ECG    No new tracing as of 09/08/2019- Personally Reviewed  Cardiac Studies    Echocardiogram 09/06/2019:  1. Left ventricular ejection fraction, by visual estimation, is 40 to 45%. The left ventricle has mild to moderately decreased function. There is mildly increased left ventricular hypertrophy.  2. Left ventricular diastolic parameters are consistent with Grade I diastolic dysfunction (impaired relaxation).  3. Global right ventricle has normal systolic function.The right ventricular size is normal. No increase in right ventricular wall thickness.  4. Left atrial size was normal.  5. Right atrial size was normal.  6. The mitral valve is normal in structure. Mild mitral valve regurgitation. No evidence of mitral stenosis.  7. The tricuspid valve is normal in structure. Tricuspid valve regurgitation is not demonstrated.  8. Aortic valve mean  gradient measures 23.0 mmHg.  9. Aortic valve regurgitation is moderate. 10. The aortic valve is normal in structure. Aortic valve regurgitation is moderate. Moderate aortic valve stenosis. 11. The pulmonic valve was normal in structure. Pulmonic valve regurgitation is not visualized. 12. Mildly elevated pulmonary artery systolic pressure. 13. The inferior vena cava is normal in size with greater than 50% respiratory variability, suggesting right atrial pressure of 3 mmHg. 14. Moderate aortic stenosis. 15. There is mild global hypokinesis and dyskinesis of the interventricular septum, LVE 40-45%.  Patient Profile     85 y.o. female with a hx of aortic stenosis and recent GI bleed who is being followed by Cardiology for pre-operative assessment at the request of Dr. Ashby Dawes.   Assessment & Plan    1. Pre-operative assessment: -Pt seen 09/07/19 by Dr. Caryl Comes for pre-operative assessment for consideration of possible open GI procedure secondary to gastric mass per general surgery team after presenting to [place] with symptomatic GI bleeding per rectum with anemia>>improved with IV hydration  -Baseline Hb noted to be 11 however with significant drop to 7.4 -Unfortunately she reports a progressive hx of dyspnea and exertional chest pain. As Dr. Caryl Comes has outlined, given her recent symptoms, as well as advanced age, she is considered higher risk for proceeding with surgery. Under normal circumstances, she would need to undergo further cardiac work up with stress testing, cardiac CT or cardiac catheterization to further assess coronary anatomy however given the above and acute GI bleed, this would be contraindicated at this time. His recommendation, if in fact she is considered to proceed with surgical intervention, would be close hemodynamic intraoperative and post-operative monitoring.   2. Aortic stenosis: -Per echocardiogram, LVEF found to be 40-45% with moderate aortic stenosis and aortic  regurgitation with a mean gradient of 19mmHg and a visualized area of 0.88cm2 -No SOB or syncope noted   3. Acute kidney injury: -Creatinine, 0.84 today>>was 1.76 on presentation  -Stabilizing with hydration  -Avoid nephrotoxic medications   4. Anemia in the setting of acute GI bleed: -Hb today at 7.2>>down from 7.8 09/07/19 -Management per primary team -Appears that she has deferred open surgical intervention however is being considered for less invasive strategy with robotics    5. Tachycardia: -Likely in the setting of acute anemia>>may benefit from low dose BB therapy if persistent once Hb more stable  -Will need to be mindful of low normal BPs if initiated     Signed,  Kathyrn Drown NP-C [facility] Pager: [phone] 09/08/2019, 7:13 AM     For questions or updates, please contact   Please consult [email or web address] for contact info under Cardiology/STEMI.  Attending Addendum:  History and all data above reviewed.  Patient examined.  I agree with the findings as above.  The patient exam reveals:  VS:  BP 132/66   Pulse (!) 106   Temp 98.4 F (36.9 C) (Oral)   Resp (!) 21   Ht 5\' 5"  (1.651 m)   Wt 71.6 kg   SpO2 96%   BMI 26.27 kg/m  , BMI Body mass index is 26.27 kg/m. GENERAL:  Well appearing elderly woman in no acute distress HEENT: Pupils equal round and reactive, fundi not visualized, oral mucosa unremarkable NECK:  No jugular venous distention, waveform within normal limits, carotid upstroke brisk and symmetric, no bruits, no thyromegaly LYMPHATICS:  No cervical adenopathy LUNGS:  Clear to auscultation bilaterally HEART:  RRR.  PMI not displaced or sustained,S1 and S2 within normal limits, no S3, no S4, no clicks, no rubs, III/VI late-peaking systolic murmur at the LUSB ABD:  Flat, positive bowel sounds normal in frequency in pitch, no bruits, no rebound, no guarding, no midline pulsatile mass, no hepatomegaly, no splenomegaly EXT:  2 plus pulses throughout, no  edema, no cyanosis no clubbing SKIN:  No rashes no nodules NEURO:  Cranial nerves II through XII grossly intact, motor grossly intact throughout PSYCH:  Cognitively intact, oriented to person place and time  All available labs, radiology testing, previous records reviewed. Agree with documented assessment and plan. Ms. Ducre is a 60F with moderate AS, moderate AR, chronic systolic and diastolic heart failure LVEF 40-45%, colon cancer s/p colectomy and chemo (1988), diverticular bleeds, and DVT admitted with progressive exertional dyspnea and chest pain in the setting of GI bleed and acute blood loss anemia.  She currently feels well.  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  She is at high surgical risk for laparotomy given age, angina and heart failure.  She is not a candidate for an ischemia evaluation/management given active bleed.  Agree with pursuing the least invasive strategy for management of her tumor/GI bleed with close hemodynamic monitoring.  Maintain hgb >/=8.  Agree with blood and IV fluid for management of her tachycardia for now rather than beta blockers.  Will add when more hemodynamically stable.    Thamas Appleyard C. Oval Linsey, MD, [facility]  09/08/2019 11:52 AM

## 2019-09-08 NOTE — Progress Notes (Signed)
[facility] ADULT ICU REPLACEMENT PROTOCOL FOR AM LAB REPLACEMENT ONLY  The patient does apply for the [facility] Adult ICU Electrolyte Replacment Protocol based on the criteria listed below:   1. Is GFR >/= 40 ml/min? Yes.    Patient's GFR today is >60 2. Is urine output >/= 0.5 ml/kg/hr for the last 6 hours? Yes.   Patient's UOP is 0.9 ml/kg/hr 3. Is BUN < 60 mg/dL? Yes.    Patient's BUN today is 8 4. Abnormal electrolyte(s): k 3.1 5. Ordered repletion with: protocol 6. If a panic level lab has been reported, has the CCM MD in charge been notified? No..   Physician:    Ronda Fairly A 09/08/2019 4:03 AM

## 2019-09-09 LAB — CBC
HCT: 28.9 % — ABNORMAL LOW (ref 36.0–46.0)
Hemoglobin: 9.3 g/dL — ABNORMAL LOW (ref 12.0–15.0)
MCH: 31.5 pg (ref 26.0–34.0)
MCHC: 32.2 g/dL (ref 30.0–36.0)
MCV: 98 fL (ref 80.0–100.0)
Platelets: 159 10*3/uL (ref 150–400)
RBC: 2.95 MIL/uL — ABNORMAL LOW (ref 3.87–5.11)
RDW: 13.5 % (ref 11.5–15.5)
WBC: 8.2 10*3/uL (ref 4.0–10.5)
nRBC: 0 % (ref 0.0–0.2)

## 2019-09-09 LAB — BASIC METABOLIC PANEL
Anion gap: 8 (ref 5–15)
BUN: 7 mg/dL — ABNORMAL LOW (ref 8–23)
CO2: 24 mmol/L (ref 22–32)
Calcium: 8.3 mg/dL — ABNORMAL LOW (ref 8.9–10.3)
Chloride: 108 mmol/L (ref 98–111)
Creatinine, Ser: 0.96 mg/dL (ref 0.44–1.00)
GFR calc Af Amer: 59 mL/min — ABNORMAL LOW (ref >60)
GFR calc non Af Amer: 51 mL/min — ABNORMAL LOW (ref >60)
Glucose, Bld: 95 mg/dL (ref 70–99)
Potassium: 3.7 mmol/L (ref 3.5–5.1)
Sodium: 140 mmol/L (ref 135–145)

## 2019-09-09 LAB — TYPE AND SCREEN
ABO/RH(D): B POS
Antibody Screen: NEGATIVE
Unit division: 0

## 2019-09-09 LAB — BPAM RBC
Blood Product Expiration Date: 202012302359
ISSUE DATE / TIME: 202012071712
Unit Type and Rh: 7300

## 2019-09-09 LAB — MAGNESIUM: Magnesium: 2 mg/dL (ref 1.7–2.4)

## 2019-09-09 MED ORDER — POTASSIUM CHLORIDE 20 MEQ PO PACK
20.0000 meq | PACK | Freq: Once | ORAL | Status: AC
  Administered 2019-09-09: 20 meq via ORAL
  Filled 2019-09-09: qty 1

## 2019-09-09 MED ORDER — PANTOPRAZOLE SODIUM 40 MG PO TBEC: 40.0000 mg | DELAYED_RELEASE_TABLET | Freq: Two times a day (BID) | ORAL | 3 refills | Status: AC

## 2019-09-09 MED ORDER — SUCRALFATE 1 GM/10ML PO SUSP: 1.0000 g | Freq: Three times a day (TID) | ORAL | 1 refills | Status: DC

## 2019-09-09 MED ORDER — METOPROLOL SUCCINATE ER 25 MG PO TB24: 25.0000 mg | ORAL_TABLET | Freq: Every day | ORAL | 4 refills | Status: DC

## 2019-09-09 MED ORDER — METOPROLOL SUCCINATE ER 25 MG PO TB24
25.0000 mg | ORAL_TABLET | Freq: Every day | ORAL | Status: DC
  Administered 2019-09-09: 25 mg via ORAL
  Filled 2019-09-09: qty 1

## 2019-09-09 NOTE — Discharge Summary (Signed)
Physician Discharge Summary   Patient ID: Julia Robbins MRN: [id number] DOB/AGE: 12/27/1922 85 y.o.  Admit date: 09/04/2019 Discharge date: 09/09/2019  Primary Care Physician:  Merrilee Seashore, MD   Recommendations for Outpatient Follow-up:  1. Follow up with PCP in 1-2 weeks 2. Patient will follow-up with general surgery in 3 to 4 weeks regarding her decision about surgery 3. Patient was started on low-dose metoprolol succinate 25 mg daily 4. 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 Hyperlipidemia . Acute kidney injury ([place]) with lactic acidosis . Essential hypertension . CKD (chronic kidney disease) stage 3, GFR 30-59 ml/min . History of colon CA status post colectomy . Hypokalemia . History of DVT status post IVC filter, not on any anticoagulation . Cholelithiasis Grade 1 diastolic dysfunction Aortic stenosis on 2D echocardiogram, short run of SVT  Consults:   Gastroenterology General surgery Cardiology    Allergies:  No Known Allergies   DISCHARGE MEDICATIONS: Allergies as of 09/09/2019   No Known Allergies     Medication List    STOP taking these medications   furosemide 40 MG tablet Commonly known as: LASIX   potassium chloride SA 20 MEQ tablet Commonly known as: KLOR-CON     TAKE these medications   acetaminophen 500 MG tablet Commonly known as: TYLENOL Take 1,000 mg by mouth 2 (two) times daily as needed for moderate pain or headache.   cyclobenzaprine 10 MG tablet Commonly known as: FLEXERIL Take 10 mg by mouth 2 (two) times daily as needed for muscle spasms.   donepezil 10 MG tablet Commonly known as: ARICEPT Take 10 mg by mouth at bedtime.   ferrous sulfate 325  (65 FE) MG tablet Take 325 mg by mouth daily with breakfast.   metoprolol succinate 25 MG 24 hr tablet Commonly known as: TOPROL-XL Take 1 tablet (25 mg total) by mouth daily.   multivitamin capsule Take 1 capsule by mouth daily.   nortriptyline 10 MG capsule Commonly known as: PAMELOR Take 10 mg by mouth 2 (two) times daily.   pantoprazole 40 MG tablet Commonly known as: Protonix Take 1 tablet (40 mg total) by mouth 2 (two) times daily before a meal.   sucralfate 1 GM/10ML suspension Commonly known as: CARAFATE Take 10 mLs (1 g total) by mouth 4 (four) times daily -  with meals and at bedtime.   Vitamin D-3 25 MCG (1000 UT) Caps Take 1 tablet by mouth daily.        Brief H and P: For complete details please refer to admission H and P, but in brief 85 y.o.femalewith medical history significant ofh/o colon cancer s/p colectomy and chemo in 1998(with several subsequent colonoscopies negative for recurrence, most recently 2018 by Dr. Ardis Hughs, [facility]), h/o diverticular bleed brought in for GI bleed evaluation. Patientalsobeen c/oRUQ abdominal painfor several weeks. Daughter went to see her today and found BRB on garments and in toilet bowel. Patient reports throwing up dark coffee ground material.  ED Course:Patient was hypotensive on arrival to ED. She was administered IV Fluids with a good response. Initial lab revealed Hgb of 11g, Cr 1.96. CT abd/pelvis revealed diverticulosis and cholelithiasis otherwise negative. ED-MD consulted GI on call who recommend admission to step-down and CTA. COVID-19  test negative  Hospital Course:   Upper and lower GI bleed presented with hematemesis, hematochezia secondary to gastric cardia mass -Prior history of diverticular bleed, colon cancer status post colectomy, chemo in 1988 -Patient was placed on IV fluids, n.p.o., PPI drip.  Hemoglobin was 11 on admission. -GI was consulted and patient underwent EGD which showed nonbleeding  [place] ulcers associated with 5 cm hiatal hernia, small ulcerated centrally umbilicated mass in the cardia of the stomach, no active bleeding, felt likely to be the source of GI bleed. -Patient required 1 unit packed RBC transfusion during the hospitalization.  General surgery was consulted and requested cardiology clearance - 2D echo done for cardiac clearance showed EF of 40 to 45%, mild to moderate decreased LV function, grade 1 diastolic dysfunction -Seen by cardiology, higher risk for proceeding with surgery however proceed with close hemodynamic intraoperative and postoperative monitoring.  Progressive dyspnea secondary to aortic stenosis which places her at moderate risk. -General surgery following, who discussed with the patient and she would like to avoid surgery this admission and will follow up outpatient. -Patient is tolerating soft solid diet without any difficulty   Aortic stenosis, short run of SVT -Per 2D echo, EF 40 to 45% with moderate aortic stenosis and aortic regurgitation.  Patient has no shortness of breath or syncope at this time. -Cardiology was consulted for cardiac clearance, also recommended starting patient on Toprol-XL 25 mg daily   Acute kidney injury with lactic acidosis -Continue to hold Lasix, creatinine was 1.76 at the time of admission -Creatinine stable, 0.9 at the time of discharge   Hypokalemia Resolved, potassium 3.7 at the time of discharge  Right upper quadrant pain, intermittent -Abdominal ultrasound showed dilated common bile duct without obstructing mass, no intrahepatic biliary dilatation.  Large gallstones without evidence for acute cholecystitis, hepatic steatosis   History of colon CA -Status post colectomy, chemo in 1998 with several subsequent colonoscopies negative for recurrence most recently by Dr. Ardis Hughs in 2018.  History of DVT Status post IVC filter not on any anticoagulation outpatient  Hypertension, history of grade 1  diastolic dysfunction -Continue to hold Lasix, BP has been soft Currently stable,  Cognitive impairment -Currently appears to be at baseline   Day of Discharge S: No acute complaints, tolerating soft diet, no fevers or chills.  No acute issues overnight.  BP 136/68   Pulse 83   Temp 98.7 F (37.1 C) (Oral)   Resp 20   Ht 5\' 5"  (1.651 m)   Wt 71.6 kg   SpO2 100%   BMI 26.27 kg/m   Physical Exam: General: Alert and awake oriented x3 not in any acute distress. HEENT: anicteric sclera, pupils reactive to light and accommodation CVS: S1-S2 clear no murmur rubs or gallops Chest: clear to auscultation bilaterally, no wheezing rales or rhonchi Abdomen: soft nontender, nondistended, normal bowel sounds Extremities: no cyanosis, clubbing or edema noted bilaterally Neuro: No new deficits   The results of significant diagnostics from this hospitalization (including imaging, microbiology, ancillary and laboratory) are listed below for reference.      Procedures/Studies:  Ct Abdomen Pelvis Wo Contrast  Result Date: 09/04/2019 CLINICAL DATA:  Right lower quadrant pain. Right red blood per rectum. EXAM: CT ABDOMEN AND PELVIS WITHOUT CONTRAST TECHNIQUE: Multidetector CT imaging of the abdomen and pelvis was performed following the standard protocol without IV contrast. COMPARISON:  02/14/2017 FINDINGS: Lower chest: Scarring/fibrosis in the lung bases. No acute abnormality. Hepatobiliary: Gallstones fill the gallbladder. No focal hepatic abnormality. Pancreas:  No focal abnormality or ductal dilatation. Spleen: No focal abnormality.  Normal size. Adrenals/Urinary Tract: No adrenal abnormality. No focal renal abnormality. No stones or hydronephrosis. Urinary bladder is unremarkable. Stomach/Bowel: Sigmoid diverticulosis. No active diverticulitis. Stomach and small bowel decompressed. No bowel obstruction. Vascular/Lymphatic: Aortic atherosclerosis. No enlarged abdominal or pelvic lymph nodes.  IVC filter in place. Reproductive: Uterus and adnexa unremarkable.  No mass. Other: No free fluid or free air. Musculoskeletal: No acute bony abnormality. IMPRESSION: Sigmoid diverticulosis.  No active diverticulitis. Cholelithiasis. Aortic atherosclerosis. No acute findings. Electronically Signed   By: Rolm Baptise M.D.   On: 09/04/2019 20:43   US Abdomen Limited  Result Date: 09/05/2019 CLINICAL DATA:  Right upper quadrant abdominal pain for 1 day. EXAM: ULTRASOUND ABDOMEN LIMITED RIGHT UPPER QUADRANT COMPARISON:  CT of the abdomen and pelvis without contrast 09/04/2019 FINDINGS: Gallbladder: Wall echo complex is present with multiple known large stones. Gallbladder wall thickness is within normal limits at 1.9 mm. There is no sonographic Murphy's sign. Common bile duct: Diameter: The common bile duct is markedly dilated, measuring 17.5 cm. No obstructing mass lesion is present. Liver: Rolled the liver is diffusely echogenic. No focal lesions are present. Portal vein is patent on color Doppler imaging with normal direction of blood flow towards the liver. Other: None. IMPRESSION: 1. Dilated common bile duct without obstructing mass lesion. No intrahepatic biliary dilation is evident. 2. Large gallstones without evidence for acute cholecystitis 3. Hepatic steatosis.  No discrete hepatic lesions. Electronically Signed   By: San Morelle M.D.   On: 09/05/2019 06:02       LAB RESULTS: Basic Metabolic Panel: Recent Labs  Lab 09/08/19 0230 09/09/19 0232  NA 141 140  K 3.4* 3.7  CL 108 108  CO2 24 24  GLUCOSE 110* 95  BUN 8 7*  CREATININE 0.84 0.96  CALCIUM 8.2* 8.3*  MG  --  2.0   Liver Function Tests: Recent Labs  Lab 09/04/19 1928 09/05/19 0225  AST 21 16  ALT 13 12  ALKPHOS 43 39  BILITOT 0.7 0.3  PROT 7.4 6.7  ALBUMIN 3.4* 3.2*   Recent Labs  Lab 09/04/19 2228  LIPASE 21   No results for input(s): AMMONIA in the last 168 hours. CBC: Recent Labs  Lab  09/06/19 0918  09/08/19 2259 09/09/19 0232  WBC 7.1   < > 7.8 8.2  NEUTROABS 4.1  --   --   --   HGB 7.5*   < > 9.7* 9.3*  HCT 23.8*   < > 30.3* 28.9*  MCV 101.3*   < > 97.7 98.0  PLT 135*   < > 165 159   < > = values in this interval not displayed.   Cardiac Enzymes: No results for input(s): CKTOTAL, CKMB, CKMBINDEX, TROPONINI in the last 168 hours. BNP: Invalid input(s): POCBNP CBG: No results for input(s): GLUCAP in the last 168 hours.    Disposition and Follow-up: Discharge Instructions    Discharge instructions   Complete by: As directed    Soft diet   Increase activity slowly   Complete by: As directed        DISPOSITION: [facility] [place] Follow up in 1 month(s).   Specialty: General Surgery Why: You may follow up with Dr. Rosendo Gros or Dr. Johney Maine to further discuss situation and possible future needs Contact information: [place] [address] [phone]  Merrilee Seashore, MD Follow up in 2 week(s).   Specialty: Internal Medicine Contact information: [address] [place] [address] [phone]            Time coordinating discharge:  35 minutes  Signed:   Estill Cotta M.D. [facility] 09/09/2019, 1:07 PM

## 2019-09-09 NOTE — Progress Notes (Signed)
Progress Note  Patient Name: Julia Robbins Date of Encounter: 09/09/2019  Primary Cardiologist: New to [facility], Dr. Skeet Latch  Subjective   Pt is doing well. She is happy that she go to eat this morning. She denies any chest pain or shortness of breath. PT is getting ready to get pt up to chair. She hopes to go home soon, possibly tomorrow, plans to stay with her daughter.   Inpatient Medications    Scheduled Meds: . Chlorhexidine Gluconate Cloth  6 each Topical Daily  . nortriptyline  10 mg Oral QHS  . pantoprazole (PROTONIX) IV  40 mg Intravenous Q12H  . sucralfate  1 g Oral TID WC & HS   Continuous Infusions: . lactated ringers 20 mL/hr at 09/09/19 0400   PRN Meds: acetaminophen **OR** acetaminophen, fentaNYL (SUBLIMAZE) injection, influenza vaccine adjuvanted, ondansetron (ZOFRAN) IV   Vital Signs    Vitals:   09/08/19 2108 09/09/19 0002 09/09/19 0327 09/09/19 0750  BP: 136/68     Pulse: 83     Resp: 20     Temp: 98 F (36.7 C) 98.6 F (37 C) 98.5 F (36.9 C) 98.7 F (37.1 C)  TempSrc: Oral Oral Oral Oral  SpO2: 100%     Weight:      Height:        Intake/Output Summary (Last 24 hours) at 09/09/2019 0838 Last data filed at 09/09/2019 0600 Gross per 24 hour  Intake 831.15 ml  Output 950 ml  Net -118.85 ml   Last 3 Weights 09/06/2019 09/05/2019 09/05/2019  Weight (lbs) 157 lb 13.6 oz 152 lb 1.9 oz 152 lb 1.9 oz  Weight (kg) 71.6 kg 69 kg 69 kg      Telemetry    Sinus rhythm 80's-90's - Personally Reviewed  ECG    No new tracings for review  Physical Exam   GEN: Frail, elderly female. No acute distress.   Neck: No JVD Cardiac: RRR, 3/6 harsh systolic murmur LUSB Respiratory: Clear to auscultation bilaterally. GI: Soft, nontender, non-distended  MS: No edema; No deformity. Neuro:  Nonfocal  Psych: Normal affect   Labs    High Sensitivity Troponin:  No results for input(s): TROPONINIHS in the last 720 hours.    Chemistry Recent Labs   Lab 09/04/19 1928 09/05/19 0225  09/07/19 0238 09/08/19 0230 09/09/19 0232  NA 140 142   < > 138 141 140  K 4.6 3.8   < > 3.5 3.4* 3.7  CL 105 108   < > 105 108 108  CO2 22 21*   < > 24 24 24   GLUCOSE 126* 105*   < > 103* 110* 95  BUN 55* 52*   < > 14 8 7*  CREATININE 1.76* 1.34*   < > 0.94 0.84 0.96  CALCIUM 9.3 8.5*   < > 8.2* 8.2* 8.3*  PROT 7.4 6.7  --   --   --   --   ALBUMIN 3.4* 3.2*  --   --   --   --   AST 21 16  --   --   --   --   ALT 13 12  --   --   --   --   ALKPHOS 43 39  --   --   --   --   BILITOT 0.7 0.3  --   --   --   --   GFRNONAA 24* 34*   < > 52* 59* 51*  GFRAA 28*  39*   < > >60 >60 59*  ANIONGAP 13 13   < > 9 9 8    < > = values in this interval not displayed.     Hematology Recent Labs  Lab 09/08/19 1603 09/08/19 2259 09/09/19 0232  WBC 7.3 7.8 8.2  RBC 2.57* 3.10* 2.95*  HGB 8.2* 9.7* 9.3*  HCT 25.6* 30.3* 28.9*  MCV 99.6 97.7 98.0  MCH 31.9 31.3 31.5  MCHC 32.0 32.0 32.2  RDW 13.1 13.5 13.5  PLT 174 165 159    BNPNo results for input(s): BNP, PROBNP in the last 168 hours.   DDimer No results for input(s): DDIMER in the last 168 hours.   Radiology    No results found.  Cardiac Studies   Echocardiogram 09/06/2019:  1. Left ventricular ejection fraction, by visual estimation, is 40 to 45%. The left ventricle has mild to moderately decreased function. There is mildly increased left ventricular hypertrophy. 2. Left ventricular diastolic parameters are consistent with Grade I diastolic dysfunction (impaired relaxation). 3. Global right ventricle has normal systolic function.The right ventricular size is normal. No increase in right ventricular wall thickness. 4. Left atrial size was normal. 5. Right atrial size was normal. 6. The mitral valve is normal in structure. Mild mitral valve regurgitation. No evidence of mitral stenosis. 7. The tricuspid valve is normal in structure. Tricuspid valve regurgitation is not  demonstrated. 8. Aortic valve mean gradient measures 23.0 mmHg. 9. Aortic valve regurgitation is moderate. 10. The aortic valve is normal in structure. Aortic valve regurgitation is moderate. Moderate aortic valve stenosis. 11. The pulmonic valve was normal in structure. Pulmonic valve regurgitation is not visualized. 12. Mildly elevated pulmonary artery systolic pressure. 13. The inferior vena cava is normal in size with greater than 50% respiratory variability, suggesting right atrial pressure of 3 mmHg. 14. Moderate aortic stenosis. 15. There is mild global hypokinesis and dyskinesis of the interventricular septum, LVE 40-45%.  Patient Profile     85 y.o. female with a hx of aortic stenosis and recent GI bleed who is being followed by Cardiology for pre-operative assessment at the request of Dr. Ashby Dawes.   Assessment & Plan    GI bleed/anemia -Patient admitted to [facility] long hospital with symptomatic GI bleeding per rectum with anemia, improved with IV hydration. -Baseline hemoglobin noted to be 11 however she was noted to have a significant drop to 7.4. 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  She is at high surgical risk for laparotomy given age, angina and heart failure.  She is not a candidate for ischemic evaluation/management given active GI bleed.  We advise pursuing the least invasive strategy for management of her tumor/GI bleed with close hemodynamic monitoring.  Maintain hemoglobin greater than or equal to 8.  Agree with using blood and IV fluid for management of her tachycardia for now rather than beta-blockers.  Heart rate appears to be stable in the 80s-90s. -Per GI surgeon, the patient does not wish to pursue surgery at this time.  They are exploring other treatment options. -Patient received 1 unit of packed red blood cells.  Hemoglobin is stable at 9.3 today.  Aortic stenosis -Per echocardiogram, LVEF  40-45% with moderate aortic stenosis and aortic regurgitation with a mean gradient of 23 mmHg and a visualized area of 0.88 cm. -Patient without shortness of breath or syncope.  Acute kidney injury -Serum creatinine was 1.76 on presentation, improved with IV hydration.  Serum creatinine is 0.96 today. -Avoid nephrotoxic medications  Tachycardia -Likely related to acute anemia, heart rate is more stable in the 80s-90s.  Blood pressure is stable.      For questions or updates, please contact [place] Please consult [email or web address] for contact info under        Signed, Daune Perch, NP  09/09/2019, 8:38 AM

## 2019-09-09 NOTE — Evaluation (Signed)
Physical Therapy Evaluation Patient Details Name: Julia Robbins MRN: [id number] DOB: [birth year] Today's Date: 09/09/2019   History of Present Illness  85 y.o. female admitted with Upper and lower GI bleed presented with hematemesis, hematochezia secondary to gastric mass.  medical history significant of h/o colon cancer s/p colectomy and chemo in 1998.  Clinical Impression  Pt admitted with above diagnosis.  Pt very pleasant and motivated, independent at her baseline. amb 50' today with min assist, VSS, on RA.   Pt currently with functional limitations due to the deficits listed below (see PT Problem List). Pt will benefit from skilled PT to increase their independence and safety with mobility to allow discharge to the venue listed below.       Follow Up Recommendations Home health PT    Equipment Recommendations  None recommended by PT    Recommendations for Other Services       Precautions / Restrictions Precautions Precautions: Fall Restrictions Weight Bearing Restrictions: No      Mobility  Bed Mobility Overal bed mobility: Needs Assistance Bed Mobility: Supine to Sit     Supine to sit: Min guard;Supervision     General bed mobility comments: for safety, incr time  Transfers Overall transfer level: Needs assistance Equipment used: Rolling walker (2 wheeled) Transfers: Sit to/from Stand Sit to Stand: Min assist         General transfer comment: light assist to rise and transition to RW  Ambulation/Gait Ambulation/Gait assistance: Min assist;Min guard Gait Distance (Feet): 50 Feet Assistive device: Rolling walker (2 wheeled) Gait Pattern/deviations: Step-through pattern;Decreased stride length;Wide base of support     General Gait Details: cues to keep feet inside RW with turns, overall safety  Stairs            Wheelchair Mobility    Modified Rankin (Stroke Patients Only)       Balance Overall balance assessment: Needs assistance            Standing balance-Leahy Scale: Fair Standing balance comment: reliant on UEs for dynamic tasks                             Pertinent Vitals/Pain Pain Assessment: No/denies pain    Home Living Family/patient expects to be discharged to:: Private residence Living Arrangements: Alone Available Help at Discharge: Family(dtr) Type of Home: Apartment Home Access: Elevator     Home Layout: One level Home Equipment: Environmental consultant - 2 wheels Additional Comments: pt states she is going to her dtr's houes at d/c, not completely sure about steps to enter, etc    Prior Function Level of Independence: Independent with assistive device(s)               Hand Dominance        Extremity/Trunk Assessment   Upper Extremity Assessment Upper Extremity Assessment: Overall WFL for tasks assessed    Lower Extremity Assessment RLE Deficits / Details: bil at least 3+/5, AROM WFL       Communication   Communication: No difficulties  Cognition Arousal/Alertness: Awake/alert Behavior During Therapy: WFL for tasks assessed/performed Overall Cognitive Status: Within Functional Limits for tasks assessed                                        General Comments      Exercises General Exercises - Lower Extremity Ankle  Circles/Pumps: AROM;Both;10 reps   Assessment/Plan    PT Assessment Patient needs continued PT services  PT Problem List Decreased strength;Decreased activity tolerance;Decreased balance;Decreased mobility       PT Treatment Interventions DME instruction;Therapeutic exercise;Gait training;Functional mobility training;Therapeutic activities;Patient/family education    PT Goals (Current goals can be found in the Care Plan section)  Acute Rehab PT Goals PT Goal Formulation: With patient Time For Goal Achievement: 09/23/19 Potential to Achieve Goals: Good    Frequency Min 3X/week   Barriers to discharge        Co-evaluation                AM-PAC PT "6 Clicks" Mobility  Outcome Measure Help needed turning from your back to your side while in a flat bed without using bedrails?: A Little Help needed moving from lying on your back to sitting on the side of a flat bed without using bedrails?: A Little Help needed moving to and from a bed to a chair (including a wheelchair)?: A Little Help needed standing up from a chair using your arms (e.g., wheelchair or bedside chair)?: A Little Help needed to walk in hospital room?: A Little Help needed climbing 3-5 steps with a railing? : A Lot 6 Click Score: 17    End of Session Equipment Utilized During Treatment: Gait belt Activity Tolerance: Patient tolerated treatment well Patient left: with call bell/phone within reach;in chair Nurse Communication: Mobility status PT Visit Diagnosis: Difficulty in walking, not elsewhere classified (R26.2)    Time: [id number] PT Time Calculation (min) (ACUTE ONLY): 21 min   Charges:   PT Evaluation $PT Eval Low Complexity: 1 Low          Kenyon Ana, PT  Pager: [phone] Acute Rehab Dept [facility]): [phone]   09/09/2019   [facility] 09/09/2019, 10:58 AM

## 2019-09-09 NOTE — Progress Notes (Signed)
Patient ID: Julia Robbins, female   DOB: [birth year], 85 y.o.   MRN: [id number]    4 Days Post-Op  Subjective: No complaints today.  No further bleeding per RN.  Tolerating her diet.  No pain  ROS: See above, otherwise other systems negative  Objective: Vital signs in last 24 hours: Temp:  [98 F (36.7 C)-99.5 F (37.5 C)] 98.7 F (37.1 C) (12/08 0750) Pulse Rate:  [83] 83 (12/07 2108) Resp:  [12-24] 20 (12/07 2108) BP: (108-136)/(66-85) 136/68 (12/07 2108) SpO2:  [100 %] 100 % (12/07 2108) Last BM Date: 09/08/19  Intake/Output from previous day: 12/07 0701 - 12/08 0700 In: 831.2 [I.V.:275.2; Blood:456; IV Piggyback:100] Out: 950 [Urine:950] Intake/Output this shift: No intake/output data recorded.  PE: Abd: soft, NT, ND, +BS  Lab Results:  Recent Labs    09/08/19 2259 09/09/19 0232  WBC 7.8 8.2  HGB 9.7* 9.3*  HCT 30.3* 28.9*  PLT 165 159   BMET Recent Labs    09/08/19 0230 09/09/19 0232  NA 141 140  K 3.4* 3.7  CL 108 108  CO2 24 24  GLUCOSE 110* 95  BUN 8 7*  CREATININE 0.84 0.96  CALCIUM 8.2* 8.3*   PT/INR No results for input(s): LABPROT, INR in the last 72 hours. CMP     Component Value Date/Time   NA 140 09/09/2019 0232   NA 141 04/15/2015 1012   K 3.7 09/09/2019 0232   K 4.2 04/15/2015 1012   CL 108 09/09/2019 0232   CO2 24 09/09/2019 0232   CO2 25 04/15/2015 1012   GLUCOSE 95 09/09/2019 0232   GLUCOSE 103 04/15/2015 1012   BUN 7 (L) 09/09/2019 0232   BUN 13.0 04/15/2015 1012   CREATININE 0.96 09/09/2019 0232   CREATININE 0.9 04/15/2015 1012   CALCIUM 8.3 (L) 09/09/2019 0232   CALCIUM 9.8 04/15/2015 1012   PROT 6.7 09/05/2019 0225   PROT 7.4 04/15/2015 1012   ALBUMIN 3.2 (L) 09/05/2019 0225   ALBUMIN 3.4 (L) 04/15/2015 1012   AST 16 09/05/2019 0225   AST 16 04/15/2015 1012   ALT 12 09/05/2019 0225   ALT 12 04/15/2015 1012   ALKPHOS 39 09/05/2019 0225   ALKPHOS 64 04/15/2015 1012   BILITOT 0.3 09/05/2019 0225   BILITOT 0.54  04/15/2015 1012   GFRNONAA 51 (L) 09/09/2019 0232   GFRAA 59 (L) 09/09/2019 0232   Lipase     Component Value Date/Time   LIPASE 21 09/04/2019 2228       Studies/Results: No results found.  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  She is felt to be higher risk for surgical intervention given progressive dyspnea and chest pain with exertion. -I discussed her case with two of our foregut surgeons yesterday and both felt like given her age and comorbidities as well as length of time between bleeding episodes, that they would not rush to an operation.  This area is technically resectable, but it would certainly change her quality of life and her independence to have an operation at her age.  This was discussed with the patient and her daughter.  The patient does not want an operation.  The daughter is unsure.  I have informed them that our surgeon's are happy to meet with them in the office to further discuss her situation. -otherwise right now, so does not  appear to have any further active bleeding.  She is on a regular diet. -she is surgically stable for DC home when felt medically stable -no plans for surgery this admission.  We will sign off.  Her follow up information has been placed in the DC instruction section as I discussed with her daughter.   FEN - soft VTE - on hold due to UGI bleed ID - none   LOS: 5 days    Henreitta Cea , [facility] Surgery 09/09/2019, 8:45 AM Please see Amion for pager number during day hours 7:00am-4:30pm

## 2019-09-15 DIAGNOSIS — Z09 Encounter for follow-up examination after completed treatment for conditions other than malignant neoplasm: Secondary | ICD-10-CM | POA: Diagnosis not present

## 2019-09-15 DIAGNOSIS — D371 Neoplasm of uncertain behavior of stomach: Secondary | ICD-10-CM | POA: Diagnosis not present

## 2019-09-15 DIAGNOSIS — K922 Gastrointestinal hemorrhage, unspecified: Secondary | ICD-10-CM | POA: Diagnosis not present

## 2019-09-15 DIAGNOSIS — I350 Nonrheumatic aortic (valve) stenosis: Secondary | ICD-10-CM | POA: Diagnosis not present

## 2019-09-15 DIAGNOSIS — N179 Acute kidney failure, unspecified: Secondary | ICD-10-CM | POA: Diagnosis not present

## 2019-11-07 ENCOUNTER — Encounter (HOSPITAL_BASED_OUTPATIENT_CLINIC_OR_DEPARTMENT_OTHER): Payer: Medicare Other | Attending: Internal Medicine | Admitting: Internal Medicine

## 2019-11-07 ENCOUNTER — Other Ambulatory Visit: Payer: Self-pay

## 2019-11-07 DIAGNOSIS — N183 Chronic kidney disease, stage 3 unspecified: Secondary | ICD-10-CM | POA: Insufficient documentation

## 2019-11-07 DIAGNOSIS — M25571 Pain in right ankle and joints of right foot: Secondary | ICD-10-CM | POA: Diagnosis not present

## 2019-11-07 DIAGNOSIS — Z86718 Personal history of other venous thrombosis and embolism: Secondary | ICD-10-CM | POA: Insufficient documentation

## 2019-11-07 DIAGNOSIS — I87323 Chronic venous hypertension (idiopathic) with inflammation of bilateral lower extremity: Secondary | ICD-10-CM | POA: Insufficient documentation

## 2019-11-07 DIAGNOSIS — Z85038 Personal history of other malignant neoplasm of large intestine: Secondary | ICD-10-CM | POA: Insufficient documentation

## 2019-11-07 DIAGNOSIS — L988 Other specified disorders of the skin and subcutaneous tissue: Secondary | ICD-10-CM | POA: Diagnosis not present

## 2019-11-07 DIAGNOSIS — S91001A Unspecified open wound, right ankle, initial encounter: Secondary | ICD-10-CM | POA: Diagnosis not present

## 2019-11-07 DIAGNOSIS — I129 Hypertensive chronic kidney disease with stage 1 through stage 4 chronic kidney disease, or unspecified chronic kidney disease: Secondary | ICD-10-CM | POA: Diagnosis not present

## 2019-11-07 DIAGNOSIS — G9009 Other idiopathic peripheral autonomic neuropathy: Secondary | ICD-10-CM | POA: Diagnosis not present

## 2019-11-07 NOTE — Progress Notes (Signed)
[name], [name] ([id number]) Visit Report for 11/07/2019 Chief Complaint Document Details Patient Name: Date of Service: Julia, Robbins 11/07/2019 2:45 PM Medical Record Number:[id number] Patient Account Number: [id number] Date of Birth/Sex: Treating RN: Nov 18, 1922 (86 y.o. F) Primary Care Provider: Merrilee Seashore Other Clinician: Referring Provider: Treating Provider/Extender:Madesyn Ast, Reggy Eye, Leonidas Romberg in Treatment: 0 Information Obtained from: Patient Chief Complaint 10/28/2018;; patient is here for review wound on her right heel 11/07/19; patient is here for review of painful areas on her right medial ankle/ Electronic Signature(s) Signed: 11/07/2019 5:45:44 PM By: Linton Ham MD Entered By: Linton Ham on 11/07/2019 16:17:53 -------------------------------------------------------------------------------- HPI Details Patient Name: Date of Service: Julia Robbins 11/07/2019 2:45 PM Medical Record [id number] Patient Account Number: [id number] Date of Birth/Sex: Treating RN: [birth year] (86 y.o. F) Primary Care Provider: Merrilee Seashore Other Clinician: Referring Provider: Treating Provider/Extender:Aliciana Ricciardi, Reggy Eye, Leonidas Romberg in Treatment: 0 History of Present Illness HPI Description: ADMISSION 10/21/2018 This is a 86 year old woman who arrives in clinic accompanied by her daughter. They are here for review of an area on the right medial heel just above the plantar surface. This is below the medial malleolus. Apparently it has been there for several months according to the patient and the daughter only became aware of it when she noticed it was a affecting her ability to walk she apparently was in this clinic in 2014 cared for by Dr. Jerline Pain with similar lesions although I do not have any of these records. 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 ABIs in our clinic were 1.14 on the right 10/28/18. This is an area on the right medial heel just above the plantar surface and below the medial malleolus. When she arrived in clinic last week this was covered in a hyperkeratotic surface. I removed some of the hyperkeratosis and then biopsied the underlying tissue however the biopsy result is coming back saying that there was no underlying epidermis and dermis. I thought I was careful to provide this, nevertheless they did not comment underlying malignancy. There is a history of her being in this clinic years ago with apparently similar lesions on her bilateral lower legs cared for by Dr. Jerline Pain. I do not have any of these records and I'm really not sure what was done. She does have chronic venous insufficiency but it is difficult to implicate chronic venous insufficiency/stasis dermatitis in the pathogenesis of a wound area that look like this 2/10; right medial heel just above the plantar surface. This area is thick with hyperkeratotic surface material. I removed the hyperkeratotic material and there is still a small open area. More puzzling just superior to this and just under the malleolus is a hard stick. I removed this to reveal a small open area as well. I have already biopsied this area that did not show malignancy although I did not get any surface skin 2/18; right medial heel just above the plantar surface. The patient had 2 wounds last week and has 1 remaining. She has hyperkeratotic skin around this that is tender. I cannot really explain the hyperkeratotic material. Biopsy I did was negative although I did not get any actual subcutaneous tissue 2/25 right medial heel just above the plantar surface. She has the same wound remaining as last week.  This had  a completely necrotic surface I been using silver collagen. Her original 2 wounds have healed Right medial heel just above the plantar surface. Still 1 small punched out area that continues to have necrotic surface. Scaling skin around this wound I removed. We have been using silver collagen. We have not been able to get home health [[facility]] 3/12; right medial heel just above the plantar surface a small area remains. This has about 2 mm of depth. We have been using silver collagen. The wound measures smaller 12/25/18 on evaluation today patient presents for follow-up concerning her right heel ulcer immediately. She's been seeing Dr. Dellia Nims this is actually the first time that I have seen her. With that being said she had one remaining small punch out area on the proximal portion of her right medial heel which currently doesn't seem to be causing any problems and there's no evidence of significant drainage. There was a little bit of dry drainage overlying I removed this and upon removal found that it appears to be completely healed underneath. This is good news. Overall I think just a couple of days for the Band-Aid in this form would be helpful that after that just using moisturizer lotion to keep things moist. READMISSION 11/07/2019 This is a now 86 year old patient we have seen to times in this clinic several years ago by Dr. Jerline Pain and then in the early part of 2020 by myself. At that point she had wounds on the right medial lower ankle/calcaneus. At that point she had wounds that were relatively easy to deal with compression. We ordered her juxta lite stockings but she does not wear them. According to her daughter who she now lives with she is complaining of pain when she walks but also more disturbingly episodic pain at night that wakes her up from sleep at 2 or 3:00 in the morning. They are religiously using Eucerin cream to moisten her skin. As noted she will not wear compression  stockings. Past medical history includes the placement of a Greenfield filter many years ago, chronic venous insufficiency, gastrointestinal stromal tumor, hypertension, history of colon CA status post partial colectomy, aortic stenosis, severe stasis dermatitis. The patient is not a diabetic. She was in hospital in December with GI bleeding ABIs in our clinic have previously been normal at 1.14 last time in March 2020 Electronic Signature(s) Signed: 11/07/2019 5:45:44 PM By: Linton Ham MD Entered By: Linton Ham on 11/07/2019 16:21:42 -------------------------------------------------------------------------------- Physical Exam Details Patient Name: Date of Service: Julia Robbins 11/07/2019 2:45 PM Medical Record [id number] Patient Account Number: [id number] Date of Birth/Sex: Treating RN: 06/02/1923 (86 y.o. F) Primary Care Provider: Merrilee Seashore Other Clinician: Referring Provider: Treating Provider/Extender:Oaklee Esther, Reggy Eye, Leonidas Romberg in Treatment: 0 Constitutional Sitting or standing Blood Pressure is within target range for patient.. Pulse regular and within target range for patient.Marland Kitchen Respirations regular, non-labored and within target range.. Temperature is normal and within the target range for the patient.Marland Kitchen Appears in no distress. Cardiovascular Popliteal pulses palpable. Both dorsalis pedis and posterior tibial pulses are easily palpable. Widespread hemosiderin deposition bilaterally to three quarters of the way towards her knee. Integumentary (Hair, Skin) Severe scaly dry skin especially in the medial ankle area and medial heel. Severe hemosiderin deposition. Neurological Marked reduction in sensation to the microfilament and vibration. Notes Wound exam; there is no open wound. She has dry scaly skin on the medial ankle and foot on the right but there is no open area Electronic Signature(s) Signed:  11/07/2019 5:45:44 PM By: Linton Ham MD Entered By: Linton Ham on 11/07/2019 16:26:54 -------------------------------------------------------------------------------- Physician Orders Details Patient Name: Date of Service: Julia Robbins 11/07/2019 2:45 PM Medical Record [id number] Patient Account Number: [id number] Date of Birth/Sex: Treating RN: 1922-12-31 (86 y.o. Clearnce Sorrel Primary Care Provider: Merrilee Seashore Other Clinician: Referring Provider: Treating Provider/Extender:Joycelin Radloff, Reggy Eye, Leonidas Romberg in Treatment: 0 Verbal / Phone Orders: No Diagnosis Coding Discharge From [facility] Services Discharge from [place] - call if a wound develops Skin Barriers/Peri-Wound Care Moisturizing lotion - moisturize bilateral lower extremities at night Edema Control Patient to wear own compression stockings - wear compression stockings to bilateral lower extremities. Place on first thing in the morning when you wake up and remove at night before bed Avoid standing for long periods of time Elevate legs to the level of the heart or above for 30 minutes daily and/or when sitting, a frequency of: Exercise regularly Other: - Will order Bilateral Juxtalites Patient Medications Allergies: No Known Drug Allergies Notifications Medication Indication Start End triamcinolone acetonide 11/07/2019 DOSE topical 0.1 % cream - cream topical 1:4 in certaphil cream apply daily to affected areas Electronic Signature(s) Signed: 11/07/2019 4:29:16 PM By: Linton Ham MD Entered By: Linton Ham on 11/07/2019 16:29:15 -------------------------------------------------------------------------------- Problem List Details Patient Name: Date of Service: Julia Robbins 11/07/2019 2:45 PM Medical Record [id number] Patient Account Number: [id number] Date of Birth/Sex: Treating RN: [birth year] (86 y.o. F) Primary Care Provider: Merrilee Seashore Other Clinician: Referring Provider:  Treating Provider/Extender:Cj Beecher, Reggy Eye, Leonidas Romberg in Treatment: 0 Active Problems ICD-10 Evaluated Encounter Code Description Active Date Today Diagnosis I87.323 Chronic venous hypertension (idiopathic) with 11/07/2019 No Yes inflammation of bilateral lower extremity G90.09 Other idiopathic peripheral autonomic neuropathy 11/07/2019 No Yes Inactive Problems Resolved Problems Electronic Signature(s) Signed: 11/07/2019 5:45:44 PM By: Linton Ham MD Entered By: Linton Ham on 11/07/2019 16:16:47 -------------------------------------------------------------------------------- Progress Note Details Patient Name: Date of Service: Julia Robbins 11/07/2019 2:45 PM Medical Record [id number] Patient Account Number: [id number] Date of Birth/Sex: Treating RN: [birth year] (86 y.o. F) Primary Care Provider: Merrilee Seashore Other Clinician: Referring Provider: Treating Provider/Extender:Serapio Edelson, Reggy Eye, Leonidas Romberg in Treatment: 0 Subjective Chief Complaint Information obtained from Patient 10/28/2018;; patient is here for review wound on her right heel 11/07/19; patient is here for review of painful areas on her right medial ankle/ History of Present Illness (HPI) ADMISSION 10/21/2018 This is a 86 year old woman who arrives in clinic accompanied by her daughter. They are here for review of an area on the right medial heel just above the plantar surface. This is below the medial malleolus. Apparently it has been there for several months according to the patient and the daughter only became aware of it when she noticed it was a affecting her ability to walk she apparently was in this clinic in 2014 cared for by Dr. Jerline Pain with similar lesions although I do not have any of these records. She apparently went to her primary physician and was given triamcinolone which did not help Past medical history includes a remote history of stage III adeno CA of the  colon treated with surgery and chemotherapy, DVT necessitating placement of a Greenfield filter, chronic lower extremity venous disease. ABIs in our clinic were 1.14 on the right 10/28/18. This is an area on the right medial heel just above the plantar surface and below the medial malleolus. When she arrived in clinic last week this was covered in a hyperkeratotic surface. I removed some of the hyperkeratosis  and then biopsied the underlying tissue however the biopsy result is coming back saying that there was no underlying epidermis and dermis. I thought I was careful to provide this, nevertheless they did not comment underlying malignancy. There is a history of her being in this clinic years ago with apparently similar lesions on her bilateral lower legs cared for by Dr. Jerline Pain. I do not have any of these records and I'm really not sure what was done. She does have chronic venous insufficiency but it is difficult to implicate chronic venous insufficiency/stasis dermatitis in the pathogenesis of a wound area that look like this 2/10; right medial heel just above the plantar surface. This area is thick with hyperkeratotic surface material. I removed the hyperkeratotic material and there is still a small open area. More puzzling just superior to this and just under the malleolus is a hard stick. I removed this to reveal a small open area as well. I have already biopsied this area that did not show malignancy although I did not get any surface skin 2/18; right medial heel just above the plantar surface. The patient had 2 wounds last week and has 1 remaining. She has hyperkeratotic skin around this that is tender. I cannot really explain the hyperkeratotic material. Biopsy I did was negative although I did not get any actual subcutaneous tissue 2/25 right medial heel just above the plantar surface. She has the same wound remaining as last week. This had a completely necrotic surface I been using  silver collagen. Her original 2 wounds have healed Right medial heel just above the plantar surface. Still 1 small punched out area that continues to have necrotic surface. Scaling skin around this wound I removed. We have been using silver collagen. We have not been able to get home health [[facility]] 3/12; right medial heel just above the plantar surface a small area remains. This has about 2 mm of depth. We have been using silver collagen. The wound measures smaller 12/25/18 on evaluation today patient presents for follow-up concerning her right heel ulcer immediately. She's been seeing Dr. Dellia Nims this is actually the first time that I have seen her. With that being said she had one remaining small punch out area on the proximal portion of her right medial heel which currently doesn't seem to be causing any problems and there's no evidence of significant drainage. There was a little bit of dry drainage overlying I removed this and upon removal found that it appears to be completely healed underneath. This is good news. Overall I think just a couple of days for the Band-Aid in this form would be helpful that after that just using moisturizer lotion to keep things moist. READMISSION 11/07/2019 This is a now 86 year old patient we have seen to times in this clinic several years ago by Dr. Jerline Pain and then in the early part of 2020 by myself. At that point she had wounds on the right medial lower ankle/calcaneus. At that point she had wounds that were relatively easy to deal with compression. We ordered her juxta lite stockings but she does not wear them. According to her daughter who she now lives with she is complaining of pain when she walks but also more disturbingly episodic pain at night that wakes her up from sleep at 2 or 3:00 in the morning. They are religiously using Eucerin cream to moisten her skin. As noted she will not wear compression stockings. Past medical history includes  the placement of a Greenfield  filter many years ago, chronic venous insufficiency, gastrointestinal stromal tumor, hypertension, history of colon CA status post partial colectomy, aortic stenosis, severe stasis dermatitis. The patient is not a diabetic. She was in hospital in December with GI bleeding ABIs in our clinic have previously been normal at 1.14 last time in March 2020 Patient History Information obtained from Patient. Allergies No Known Drug Allergies Family History Heart Disease - Mother, No family history of Cancer, Diabetes, Hereditary Spherocytosis, Hypertension, Kidney Disease, Lung Disease, Seizures, Stroke, Thyroid Problems, Tuberculosis. Social History Never smoker, Marital Status - Widowed, Alcohol Use - Never, Drug Use - No History, Caffeine Use - Daily. Medical History Eyes Denies history of Cataracts, Glaucoma, Optic Neuritis Ear/Nose/Mouth/Throat Denies history of Chronic sinus problems/congestion, Middle ear problems Hematologic/Lymphatic Patient has history of Anemia Denies history of Hemophilia, Human Immunodeficiency Virus, Lymphedema, Sickle Cell Disease Respiratory Denies history of Aspiration, Asthma, Chronic Obstructive Pulmonary Disease (COPD), Pneumothorax, Sleep Apnea, Tuberculosis Cardiovascular Patient has history of Deep Vein Thrombosis, Hypertension, Peripheral Arterial Disease Denies history of Angina, Arrhythmia, Congestive Heart Failure, Coronary Artery Disease, Hypotension, Myocardial Infarction, Peripheral Venous Disease, Phlebitis, Vasculitis Gastrointestinal Denies history of Cirrhosis , Colitis, Crohnoos, Hepatitis A, Hepatitis B, Hepatitis C Endocrine Denies history of Type I Diabetes, Type II Diabetes Genitourinary Denies history of End Stage Renal Disease Immunological Denies history of Lupus Erythematosus, Raynaudoos, Scleroderma Integumentary (Skin) Denies history of History of Burn Musculoskeletal Patient has history of  Osteoarthritis Denies history of Gout, Rheumatoid Arthritis, Osteomyelitis Neurologic Denies history of Dementia, Neuropathy, Quadriplegia, Paraplegia, Seizure Disorder Oncologic Patient has history of Received Chemotherapy, Received Radiation Psychiatric Denies history of Anorexia/bulimia, Confinement Anxiety Hospitalization/Surgery History - cellulitis. - GI bleed. Medical And Surgical History Notes Cardiovascular aorttic stenosis Gastrointestinal GI bleed, gastric tumor Genitourinary CKD stage 3, kidney stones Oncologic gastric tumor, hx colon cancer Review of Systems (ROS) Constitutional Symptoms (General Health) Denies complaints or symptoms of Fatigue, Fever, Chills, Marked Weight Change. Eyes Complains or has symptoms of Glasses / Contacts. Ear/Nose/Mouth/Throat Denies complaints or symptoms of Chronic sinus problems or rhinitis. Respiratory Complains or has symptoms of Shortness of Breath. Denies complaints or symptoms of Chronic or frequent coughs. Gastrointestinal Denies complaints or symptoms of Frequent diarrhea, Nausea, Vomiting. Endocrine Denies complaints or symptoms of Heat/cold intolerance. Integumentary (Skin) Complains or has symptoms of Wounds - right lower leg. Musculoskeletal Denies complaints or symptoms of Muscle Pain, Muscle Weakness. Neurologic Denies complaints or symptoms of Numbness/parasthesias. Psychiatric Denies complaints or symptoms of Claustrophobia, Suicidal. Objective Constitutional Sitting or standing Blood Pressure is within target range for patient.. Pulse regular and within target range for patient.Marland Kitchen Respirations regular, non-labored and within target range.. Temperature is normal and within the target range for the patient.Marland Kitchen Appears in no distress. Vitals Time Taken: 3:14 PM, Height: 65 in, Source: Stated, Weight: 180 lbs, BMI: 30, Temperature: 98.6 F, Pulse: 90 bpm, Respiratory Rate: 18 breaths/min, Blood Pressure: 108/65  mmHg. Cardiovascular Popliteal pulses palpable. Both dorsalis pedis and posterior tibial pulses are easily palpable. Widespread hemosiderin deposition bilaterally to three quarters of the way towards her knee. Neurological Marked reduction in sensation to the microfilament and vibration. General Notes: Wound exam; there is no open wound. She has dry scaly skin on the medial ankle and foot on the right but there is no open area Integumentary (Hair, Skin) Severe scaly dry skin especially in the medial ankle area and medial heel. Severe hemosiderin deposition. Assessment Active Problems ICD-10 Chronic venous hypertension (idiopathic) with inflammation of bilateral lower extremity Other idiopathic peripheral  autonomic neuropathy Plan Discharge From [facility] Services: Discharge from [place] - call if a wound develops Skin Barriers/Peri-Wound Care: Moisturizing lotion - moisturize bilateral lower extremities at night Edema Control: Patient to wear own compression stockings - wear compression stockings to bilateral lower extremities. Place on first thing in the morning when you wake up and remove at night before bed Avoid standing for long periods of time Elevate legs to the level of the heart or above for 30 minutes daily and/or when sitting, a frequency of: Exercise regularly Other: - Will order Bilateral Juxtalites The following medication(s) was prescribed: triamcinolone acetonide topical 0.1 % cream cream topical 1:4 in certaphil cream apply daily to affected areas starting 11/07/2019 1. The patient has no open wound 2. It is difficult to explain her constellation of complaints. Certainly the severely fibrosed skin in the lower extremities could cause episodic inflammation if there is swelling that could explain pain but really would not easily explain pain at night that wakes her from sleep. There is no evidence of PAD. I wonder about neuropathic pain. Although the patient is  apparently not a diabetic she has markedly reduced sensation to vibration in the microfilament in both feet bilaterally. 3. She may have an idiopathic form of peripheral neuropathy which is not uncommon in very elderly people. I wonder about empiric Cymbalta at a small dose at night 5. The skin is tightly fibrotic around the right ankle I have prescribed her triamcinolone 0.1% 1-4 and Cetaphil to apply to these areas at night. Loosening up the skin and giving her some anti-inflammatory effect may relieve some of the symptoms. I am simply not sure what it is that she is exactly describing. 6. There is very little evidence of PAD certainly not severe enough to cause rest claudication at night I spent 35 minutes in the review of this patient's records, face-to-face evaluation and preparation of this record Electronic Signature(s) Signed: 11/07/2019 5:45:44 PM By: Linton Ham MD Entered By: Linton Ham on 11/07/2019 16:33:00 -------------------------------------------------------------------------------- HxROS Details Patient Name: Date of Service: Julia Robbins 11/07/2019 2:45 PM Medical Record [id number] Patient Account Number: [id number] Date of Birth/Sex: Treating RN: 08-26-1923 (86 y.o. Elam Dutch Primary Care Provider: Merrilee Seashore Other Clinician: Referring Provider: Treating Provider/Extender:Madilynn Montante, Reggy Eye, Leonidas Romberg in Treatment: 0 Information Obtained From Patient Constitutional Symptoms (General Health) Complaints and Symptoms: Negative for: Fatigue; Fever; Chills; Marked Weight Change Eyes Complaints and Symptoms: Positive for: Glasses / Contacts Medical History: Negative for: Cataracts; Glaucoma; Optic Neuritis Ear/Nose/Mouth/Throat Complaints and Symptoms: Negative for: Chronic sinus problems or rhinitis Medical History: Negative for: Chronic sinus problems/congestion; Middle ear problems Respiratory Complaints and  Symptoms: Positive for: Shortness of Breath Negative for: Chronic or frequent coughs Medical History: Negative for: Aspiration; Asthma; Chronic Obstructive Pulmonary Disease (COPD); Pneumothorax; Sleep Apnea; Tuberculosis Gastrointestinal Complaints and Symptoms: Negative for: Frequent diarrhea; Nausea; Vomiting Medical History: Negative for: Cirrhosis ; Colitis; Crohns; Hepatitis A; Hepatitis B; Hepatitis C Past Medical History Notes: GI bleed, gastric tumor Endocrine Complaints and Symptoms: Negative for: Heat/cold intolerance Medical History: Negative for: Type I Diabetes; Type II Diabetes Integumentary (Skin) Complaints and Symptoms: Positive for: Wounds - right lower leg Medical History: Negative for: History of Burn Musculoskeletal Complaints and Symptoms: Negative for: Muscle Pain; Muscle Weakness Medical History: Positive for: Osteoarthritis Negative for: Gout; Rheumatoid Arthritis; Osteomyelitis Neurologic Complaints and Symptoms: Negative for: Numbness/parasthesias Medical History: Negative for: Dementia; Neuropathy; Quadriplegia; Paraplegia; Seizure Disorder Psychiatric Complaints and Symptoms: Negative for: Claustrophobia; Suicidal Medical History:  Negative for: Anorexia/bulimia; Confinement Anxiety Hematologic/Lymphatic Medical History: Positive for: Anemia Negative for: Hemophilia; Human Immunodeficiency Virus; Lymphedema; Sickle Cell Disease Cardiovascular Medical History: Positive for: Deep Vein Thrombosis; Hypertension; Peripheral Arterial Disease Negative for: Angina; Arrhythmia; Congestive Heart Failure; Coronary Artery Disease; Hypotension; Myocardial Infarction; Peripheral Venous Disease; Phlebitis; Vasculitis Past Medical History Notes: aorttic stenosis Genitourinary Medical History: Negative for: End Stage Renal Disease Past Medical History Notes: CKD stage 3, kidney stones Immunological Medical History: Negative for: Lupus  Erythematosus; Raynauds; Scleroderma Oncologic Medical History: Positive for: Received Chemotherapy; Received Radiation Past Medical History Notes: gastric tumor, hx colon cancer Immunizations Pneumococcal Vaccine: Received Pneumococcal Vaccination: Yes Implantable Devices Yes Hospitalization / Surgery History Type of Hospitalization/Surgery cellulitis GI bleed Family and Social History Cancer: No; Diabetes: No; Heart Disease: Yes - Mother; Hereditary Spherocytosis: No; Hypertension: No; Kidney Disease: No; Lung Disease: No; Seizures: No; Stroke: No; Thyroid Problems: No; Tuberculosis: No; Never smoker; Marital Status - Widowed; Alcohol Use: Never; Drug Use: No History; Caffeine Use: Daily; Financial Concerns: No; Food, Clothing or Shelter Needs: No; Support System Lacking: No; Transportation Concerns: No Engineer, maintenance) Signed: 11/07/2019 4:26:03 PM By: Baruch Gouty RN, BSN Signed: 11/07/2019 5:45:44 PM By: Linton Ham MD Entered By: Baruch Gouty on 11/07/2019 15:22:24 -------------------------------------------------------------------------------- SuperBill Details Patient Name: Date of Service: Julia Robbins 11/07/2019 Medical Record Number:[id number] Patient Account Number: [id number] Date of Birth/Sex: Treating RN: Oct 03, 1922 (86 y.o. Clearnce Sorrel Primary Care Provider: Merrilee Seashore Other Clinician: Referring Provider: Treating Provider/Extender:Sritha Chauncey, Reggy Eye, Leonidas Romberg in Treatment: 0 Diagnosis Coding ICD-10 Codes Code Description [phone] Chronic venous hypertension (idiopathic) with inflammation of bilateral lower extremity G90.09 Other idiopathic peripheral autonomic neuropathy Facility Procedures CPT4 Code: [id number] Description: 99213 - WOUND CARE VISIT-LEV 3 EST PT Modifier: Quantity: 1 Physician Procedures CPT4 Code Description: [id number] [place] - WC PHYS LEVEL 4 - EST PT ICD-10 Diagnosis Description I87.323 Chronic  venous hypertension (idiopathic) with inflammati extremity G90.09 Other idiopathic peripheral autonomic neuropathy Modifier: on of bilateral Quantity: 1 lower Electronic Signature(s) Signed: 11/07/2019 5:45:44 PM By: Linton Ham MD Entered By: Linton Ham on 11/07/2019 16:33:23

## 2019-11-07 NOTE — Progress Notes (Signed)
Julia Robbins, Julia Robbins ([id number]) Visit Report for 11/07/2019 Abuse/Suicide Risk Screen Details Patient Name: Date of Service: Julia Robbins, Julia Robbins 11/07/2019 2:45 PM Medical Record Number:[id number] Patient Account Number: [id number] Date of Birth/Sex: Treating RN: 06/08/1923 (86 y.o. Elam Dutch Primary Care Surya Folden: Merrilee Seashore Other Clinician: Referring Cheridan Kibler: Treating Aldous Housel/Extender:Robson, Reggy Eye, Leonidas Romberg in Treatment: 0 Abuse/Suicide Risk Screen Items Answer ABUSE RISK SCREEN: Has anyone close to you tried to hurt or harm you recentlyo No Do you feel uncomfortable with anyone in your familyo No Has anyone forced you do things that you didnt want to doo No Electronic Signature(s) Signed: 11/07/2019 4:26:03 PM By: Baruch Gouty RN, BSN Entered By: Baruch Gouty on 11/07/2019 15:22:35 -------------------------------------------------------------------------------- Activities of Daily Living Details Patient Name: Date of Service: Julia Robbins, Julia Robbins 11/07/2019 2:45 PM Medical Record Number:[id number] Patient Account Number: [id number] Date of Birth/Sex: Treating RN: 05-01-1923 (86 y.o. Elam Dutch Primary Care Kandon Hosking: Merrilee Seashore Other Clinician: Referring Heber Hoog: Treating Jesiah Grismer/Extender:Robson, Reggy Eye, Leonidas Romberg in Treatment: 0 Activities of Daily Living Items Answer Activities of Daily Living (Please select one for each item) Drive Automobile Not Able Take Medications Need Assistance Use Telephone Completely Able Care for Appearance Completely Able Use Toilet Completely Able Bath / Shower Completely Able Dress Self Completely Able Feed Self Completely Able Walk Need Assistance Get In / Out Bed Completely [place] Need Assistance Shop for Self Need Assistance Electronic Signature(s) Signed: 11/07/2019 4:26:03 PM By: Baruch Gouty RN,  BSN Entered By: Baruch Gouty on 11/07/2019 15:24:06 -------------------------------------------------------------------------------- Education Screening Details Patient Name: Date of Service: Julia Robbins 11/07/2019 2:45 PM Medical Record [id number] Patient Account Number: [id number] Date of Birth/Sex: Treating RN: September 03, 1923 (86 y.o. Elam Dutch Primary Care Sheryll Dymek: Merrilee Seashore Other Clinician: Referring Franca Stakes: Treating Emslee Lopezmartinez/Extender:Robson, Reggy Eye, Leonidas Romberg in Treatment: 0 Primary Learner Assessed: Patient Learning Preferences/Education Level/Primary Language Learning Preference: Explanation, Demonstration, Printed Material Highest Education Level: High School Preferred Language: English Cognitive Barrier Language Barrier: No Translator Needed: No Memory Deficit: No Emotional Barrier: No Cultural/Religious Beliefs Affecting Medical Care: No Physical Barrier Impaired Vision: Yes Glasses Impaired Hearing: No Decreased Hand dexterity: No Knowledge/Comprehension Knowledge Level: High Comprehension Level: High Ability to understand written High instructions: Ability to understand verbal High instructions: Motivation Anxiety Level: Calm Cooperation: Cooperative Education Importance: Acknowledges Need Interest in Health Problems: Asks Questions Perception: Coherent Willingness to Engage in Self- High Management Activities: Readiness to Engage in Self- High Management Activities: Electronic Signature(s) Signed: 11/07/2019 4:26:03 PM By: Baruch Gouty RN, BSN Entered By: Baruch Gouty on 11/07/2019 15:24:54 -------------------------------------------------------------------------------- Fall Risk Assessment Details Patient Name: Date of Service: Julia Robbins 11/07/2019 2:45 PM Medical Record [id number] Patient Account Number: [id number] Date of Birth/Sex: Treating RN: 16-Dec-1922 (86 y.o. Elam Dutch Primary Care Ellarie Picking: Merrilee Seashore Other Clinician: Referring Lindy Pennisi: Treating Jerrel Tiberio/Extender:Robson, Reggy Eye, Leonidas Romberg in Treatment: 0 Fall Risk Assessment Items Have you had 2 or more falls in the last 12 monthso 0 No Have you had any fall that resulted in injury in the last 12 monthso 0 No FALLS RISK SCREEN History of falling - immediate or within 3 months 0 No Secondary diagnosis (Do you have 2 or more medical diagnoseso) 0 No Ambulatory aid None/bed rest/wheelchair/nurse 0 No Crutches/cane/walker 15 Yes Furniture 0 No Intravenous therapy Access/Saline/Heparin Lock 0 No Weak (short steps with or without shuffle, stooped but able to lift head 10 Yes while walking, may seek support from furniture) Impaired (  short steps with shuffle, may have difficulty arising from chair, 0 No head down, impaired balance) Mental Status Oriented to own ability 0 Yes Overestimates or forgets limitations 0 No Risk Level: Medium Risk Score: 25 Electronic Signature(s) Signed: 11/07/2019 4:26:03 PM By: Baruch Gouty RN, BSN Entered By: Baruch Gouty on 11/07/2019 15:25:11 -------------------------------------------------------------------------------- Foot Assessment Details Patient Name: Date of Service: Julia Robbins 11/07/2019 2:45 PM Medical Record [id number] Patient Account Number: [id number] Date of Birth/Sex: Treating RN: 1922-10-06 (86 y.o. Elam Dutch Primary Care Kahlee Metivier: Merrilee Seashore Other Clinician: Referring Thara Searing: Treating Costa Jha/Extender:Robson, Reggy Eye, Leonidas Romberg in Treatment: 0 Foot Assessment Items Site Locations + = Sensation present, - = Sensation absent, C = Callus, U = Ulcer R = Redness, W = Warmth, M = Maceration, PU = Pre-ulcerative lesion F = Fissure, S = Swelling, D = Dryness Assessment Right: Left: Other Deformity: No No Prior Foot Ulcer: No No Prior Amputation: No No Charcot Joint:  No No Ambulatory Status: Ambulatory With Help Assistance Device: Walker Gait: Steady Electronic Signature(s) Signed: 11/07/2019 4:26:03 PM By: Baruch Gouty RN, BSN Entered By: Baruch Gouty on 11/07/2019 15:30:41 -------------------------------------------------------------------------------- Nutrition Risk Screening Details Patient Name: Date of Service: Julia Robbins 11/07/2019 2:45 PM Medical Record [id number] Patient Account Number: [id number] Date of Birth/Sex: Treating RN: 14-Nov-1922 (86 y.o. Elam Dutch Primary Care Haedyn Ancrum: Merrilee Seashore Other Clinician: Referring Vanessa Kampf: Treating Milagro Belmares/Extender:Robson, Reggy Eye, Leonidas Romberg in Treatment: 0 Height (in): 65 Weight (lbs): 180 Body Mass Index (BMI): 30 Nutrition Risk Screening Items Score Screening NUTRITION RISK SCREEN: I have an illness or condition that made me change the kind and/or 0 No amount of food I eat I eat fewer than two meals per day 0 No I eat few fruits and vegetables, or milk products 0 No I have three or more drinks of beer, liquor or wine almost every day 0 No I have tooth or mouth problems that make it hard for me to eat 0 No I don't always have enough money to buy the food I need 0 No I eat alone most of the time 0 No I take three or more different prescribed or over-the-counter drugs a day 1 Yes 2 Yes Without wanting to, I have lost or gained 10 pounds in the last six months I am not always physically able to shop, cook and/or feed myself 0 No Nutrition Protocols Good Risk Protocol Provide education on Moderate Risk Protocol 0 nutrition High Risk Proctocol Risk Level: Moderate Risk Score: 3 Electronic Signature(s) Signed: 11/07/2019 4:26:03 PM By: Baruch Gouty RN, BSN Entered By: Baruch Gouty on 11/07/2019 15:25:42

## 2019-11-11 NOTE — Progress Notes (Signed)
CHELSI, SHIPLEY ([id number]) Visit Report for 11/07/2019 Allergy List Details Patient Name: Date of Service: CONNA, SHAH 11/07/2019 2:45 PM Medical Record Number:[id number] Patient Account Number: [id number] Date of Birth/Sex: Treating RN: [birth year] (86 y.o. Elam Dutch Primary Care Lincy Belles: Merrilee Seashore Other Clinician: Referring Avant Printy: Treating Helyn Schwan/Extender:Robson, Reggy Eye, Leonidas Romberg in Treatment: 0 Allergies Active Allergies No Known Drug Allergies Allergy Notes Electronic Signature(s) Signed: 11/07/2019 4:26:03 PM By: Baruch Gouty RN, BSN Entered By: Baruch Gouty on 11/07/2019 15:15:27 -------------------------------------------------------------------------------- Arrival Information Details Patient Name: Date of Service: Trevor Mace 11/07/2019 2:45 PM Medical Record [id number] Patient Account Number: [id number] Date of Birth/Sex: Treating RN: [birth year] (86 y.o. Elam Dutch Primary Care Makai Agostinelli: Merrilee Seashore Other Clinician: Referring Diron Haddon: Treating Chandra Asher/Extender:Robson, Reggy Eye, Leonidas Romberg in Treatment: 0 Visit Information Patient Arrived: Walker Arrival Time: 15:12 Accompanied By: daughter Transfer Assistance: None Patient Identification Verified: Yes Secondary Verification Process Completed: Yes Patient Requires Transmission-Based No Precautions: Patient Has Alerts: No History Since Last Visit Electronic Signature(s) Signed: 11/07/2019 4:26:03 PM By: Baruch Gouty RN, BSN Entered By: Baruch Gouty on 11/07/2019 15:13:44 -------------------------------------------------------------------------------- Clinic Level of Care Assessment Details Patient Name: Date of Service: AKYLAH, WOJAHN 11/07/2019 2:45 PM Medical Record [id number] Patient Account Number: [id number] Date of Birth/Sex: Treating RN: [birth year] (86 y.o. Clearnce Sorrel Primary Care Ilian Wessell:  Merrilee Seashore Other Clinician: Referring Quisha Mabie: Treating Arianie Couse/Extender:Robson, Reggy Eye, Leonidas Romberg in Treatment: 0 Clinic Level of Care Assessment Items TOOL 4 Quantity Score X - Use when only an EandM is performed on FOLLOW-UP visit 1 0 ASSESSMENTS - Nursing Assessment / Reassessment X - Reassessment of Co-morbidities (includes updates in patient status) 1 10 X - Reassessment of Adherence to Treatment Plan 1 5 ASSESSMENTS - Wound and Skin Assessment / Reassessment X - Simple Wound Assessment / Reassessment - one wound 1 5 []  - Complex Wound Assessment / Reassessment - multiple wounds 0 []  - Dermatologic / Skin Assessment (not related to wound area) 0 ASSESSMENTS - Focused Assessment X - Circumferential Edema Measurements - multi extremities 1 5 []  - Nutritional Assessment / Counseling / Intervention 0 X - Lower Extremity Assessment (monofilament, tuning fork, pulses) 1 5 []  - Peripheral Arterial Disease Assessment (using hand held doppler) 0 ASSESSMENTS - Ostomy and/or Continence Assessment and Care []  - Incontinence Assessment and Management 0 []  - Ostomy Care Assessment and Management (repouching, etc.) 0 PROCESS - Coordination of Care X - Simple Patient / Family Education for ongoing care 1 15 []  - Complex (extensive) Patient / Family Education for ongoing care 0 X - Staff obtains Programmer, systems, Records, Test Results / Process Orders 1 10 []  - Staff telephones HHA, Nursing Homes / Clarify orders / etc 0 []  - Routine Transfer to another Facility (non-emergent condition) 0 []  - Routine Hospital Admission (non-emergent condition) 0 X - New Admissions / Biomedical engineer / Ordering NPWT, Apligraf, etc. 1 15 []  - Emergency Hospital Admission (emergent condition) 0 X - Simple Discharge Coordination 1 10 []  - Complex (extensive) Discharge Coordination 0 PROCESS - Special Needs []  - Pediatric / Minor Patient Management 0 []  - Isolation Patient Management  0 []  - Hearing / Language / Visual special needs 0 []  - Assessment of Community assistance (transportation, D/C planning, etc.) 0 []  - Additional assistance / Altered mentation 0 []  - Support Surface(s) Assessment (bed, cushion, seat, etc.) 0 INTERVENTIONS - Wound Cleansing / Measurement []  - Simple Wound Cleansing - one wound 0 []  - Complex Wound Cleansing - multiple wounds  0 []  - Wound Imaging (photographs - any number of wounds) 0 []  - Wound Tracing (instead of photographs) 0 []  - Simple Wound Measurement - one wound 0 []  - Complex Wound Measurement - multiple wounds 0 INTERVENTIONS - Wound Dressings []  - Small Wound Dressing one or multiple wounds 0 []  - Medium Wound Dressing one or multiple wounds 0 []  - Large Wound Dressing one or multiple wounds 0 []  - Application of Medications - topical 0 []  - Application of Medications - injection 0 INTERVENTIONS - Miscellaneous []  - External ear exam 0 []  - Specimen Collection (cultures, biopsies, blood, body fluids, etc.) 0 []  - Specimen(s) / Culture(s) sent or taken to Lab for analysis 0 []  - Patient Transfer (multiple staff / Civil Service fast streamer / Similar devices) 0 []  - Simple Staple / Suture removal (25 or less) 0 []  - Complex Staple / Suture removal (26 or more) 0 []  - Hypo / Hyperglycemic Management (close monitor of Blood Glucose) 0 []  - Ankle / Brachial Index (ABI) - do not check if billed separately 0 X - Vital Signs 1 5 Has the patient been seen at the hospital within the last three years: Yes Total Score: 85 Level Of Care: New/Established - Level 3 Electronic Signature(s) Signed: 11/07/2019 5:49:59 PM By: Kela Millin Entered By: Kela Millin on 11/07/2019 15:59:04 -------------------------------------------------------------------------------- Lower Extremity Assessment Details Patient Name: Date of Service: FATEN, DILLING 11/07/2019 2:45 PM Medical Record [id number] Patient Account Number: [id number] Date of  Birth/Sex: Treating RN: Aug 03, 1923 (86 y.o. Elam Dutch Primary Care Rai Severns: Merrilee Seashore Other Clinician: Referring Alvetta Hidrogo: Treating Tazia Illescas/Extender:Robson, Reggy Eye, Leonidas Romberg in Treatment: 0 Edema Assessment Assessed: [Left: No] [Right: No] Edema: [Left: No] [Right: No] Calf Left: Right: Point of Measurement: 31 cm From Medial Instep 32.4 cm 33.5 cm Ankle Left: Right: Point of Measurement: 10 cm From Medial Instep 19 cm 19 cm Vascular Assessment Pulses: Dorsalis Pedis Palpable: [Left:Yes] [Right:Yes] Electronic Signature(s) Signed: 11/07/2019 4:26:03 PM By: Baruch Gouty RN, BSN Signed: 11/07/2019 5:49:59 PM By: Kela Millin Entered By: Kela Millin on 11/07/2019 16:07:23 -------------------------------------------------------------------------------- Multi Wound Chart Details Patient Name: Date of Service: Trevor Mace 11/07/2019 2:45 PM Medical Record [id number] Patient Account Number: [id number] Date of Birth/Sex: Treating RN: Jul 17, 1923 (86 y.o. F) Primary Care Vlad Mayberry: Merrilee Seashore Other Clinician: Referring Toia Micale: Treating Boone Gear/Extender:Robson, Reggy Eye, Leonidas Romberg in Treatment: 0 Vital Signs Height(in): 65 Pulse(bpm): 90 Weight(lbs): 180 Blood Pressure(mmHg): 108/65 Body Mass Index(BMI): 30 Temperature(F): 98.6 Respiratory 18 Rate(breaths/min): Wound Assessments Treatment Notes Electronic Signature(s) Signed: 11/07/2019 5:45:44 PM By: Linton Ham MD Entered By: Linton Ham on 11/07/2019 16:16:56 -------------------------------------------------------------------------------- Multi-Disciplinary Care Plan Details Patient Name: Date of Service: Trevor Mace 11/07/2019 2:45 PM Medical Record [id number] Patient Account Number: [id number] Date of Birth/Sex: Treating RN: 1923-01-04 (86 y.o. Clearnce Sorrel Primary Care Sira Adsit: Merrilee Seashore Other  Clinician: Referring Crestina Strike: Treating Zaira Iacovelli/Extender:Robson, Reggy Eye, Leonidas Romberg in Treatment: 0 Active Inactive Electronic Signature(s) Signed: 11/10/2019 5:38:07 PM By: Kela Millin Previous Signature: 11/07/2019 5:49:59 PM Version By: Kela Millin Entered By: Kela Millin on 11/10/2019 09:20:24 -------------------------------------------------------------------------------- Pain Assessment Details Patient Name: Date of Service: Trevor Mace 11/07/2019 2:45 PM Medical Record [id number] Patient Account Number: [id number] Date of Birth/Sex: Treating RN: 1923/04/18 (86 y.o. Elam Dutch Primary Care Monserratt Knezevic: Merrilee Seashore Other Clinician: Referring Marilu Rylander: Treating Kasra Melvin/Extender:Robson, Reggy Eye, Leonidas Romberg in Treatment: 0 Active Problems Location of Pain Severity and Description of Pain Patient Has Paino Yes Site Locations Pain Location: Pain in Ulcers  Duration of the Pain. Constant / Intermittento Intermittent Rate the pain. Current Pain Level: 0 Worst Pain Level: 10 Character of Pain Describe the Pain: Aching, Throbbing Pain Management and Medication Current Pain Management: Medication: Yes Is the Current Pain Management Adequate: Adequate Activity: Yes How does your wound impact your activities of daily livingo Sleep: Yes Bathing: No Appetite: No Relationship With Others: No Bladder Continence: No Emotions: No Bowel Continence: No Hobbies: No Toileting: No Dressing: No Notes reports pain mostly at night Electronic Signature(s) Signed: 11/07/2019 4:26:03 PM By: Baruch Gouty RN, BSN Entered By: Baruch Gouty on 11/07/2019 15:34:17 -------------------------------------------------------------------------------- Patient/Caregiver Education Details Patient Name: Date of Service: Trevor Mace 2/5/2021andnbsp2:45 PM Medical Record [phone] Patient Account Number:  [id number] Date of Birth/Gender: Treating RN: 12/15/1922 (86 y.o. Clearnce Sorrel Primary Care Physician: Merrilee Seashore Other Clinician: Referring Physician: Treating Physician/Extender:Robson, Reggy Eye, Leonidas Romberg in Treatment: 0 Education Assessment Education Provided To: Patient Education Topics Provided Nutrition: Methods: Explain/Verbal Responses: State content correctly Pain: Methods: Explain/Verbal Responses: State content correctly Electronic Signature(s) Signed: 11/07/2019 5:49:59 PM By: Kela Millin Entered By: Kela Millin on 11/07/2019 16:00:58 -------------------------------------------------------------------------------- Vitals Details Patient Name: Date of Service: Trevor Mace 11/07/2019 2:45 PM Medical Record [id number] Patient Account Number: [id number] Date of Birth/Sex: Treating RN: [birth year] (85 y.o. Elam Dutch Primary Care Lavonta Tillis: Merrilee Seashore Other Clinician: Referring Baker Kogler: Treating Daquan Crapps/Extender:Robson, Reggy Eye, Leonidas Romberg in Treatment: 0 Vital Signs Time Taken: 15:14 Temperature (F): 98.6 Height (in): 65 Pulse (bpm): 90 Source: Stated Respiratory Rate (breaths/min): 18 Weight (lbs): 180 Blood Pressure (mmHg): 108/65 Body Mass Index (BMI): 30 Reference Range: 80 - 120 mg / dl Electronic Signature(s) Signed: 11/07/2019 4:26:03 PM By: Baruch Gouty RN, BSN Entered By: Baruch Gouty on 11/07/2019 15:15:18

## 2020-01-21 DIAGNOSIS — Z85038 Personal history of other malignant neoplasm of large intestine: Secondary | ICD-10-CM | POA: Diagnosis not present

## 2020-01-21 DIAGNOSIS — R634 Abnormal weight loss: Secondary | ICD-10-CM | POA: Diagnosis not present

## 2020-01-21 DIAGNOSIS — T148XXD Other injury of unspecified body region, subsequent encounter: Secondary | ICD-10-CM | POA: Diagnosis not present

## 2020-01-21 DIAGNOSIS — N390 Urinary tract infection, site not specified: Secondary | ICD-10-CM | POA: Diagnosis not present

## 2020-01-21 DIAGNOSIS — E782 Mixed hyperlipidemia: Secondary | ICD-10-CM | POA: Diagnosis not present

## 2020-02-10 DIAGNOSIS — E538 Deficiency of other specified B group vitamins: Secondary | ICD-10-CM | POA: Diagnosis not present

## 2020-02-10 DIAGNOSIS — R269 Unspecified abnormalities of gait and mobility: Secondary | ICD-10-CM | POA: Diagnosis not present

## 2020-06-28 DIAGNOSIS — I739 Peripheral vascular disease, unspecified: Secondary | ICD-10-CM | POA: Diagnosis not present

## 2020-06-28 DIAGNOSIS — M2041 Other hammer toe(s) (acquired), right foot: Secondary | ICD-10-CM | POA: Diagnosis not present

## 2020-06-28 DIAGNOSIS — L84 Corns and callosities: Secondary | ICD-10-CM | POA: Diagnosis not present

## 2020-06-28 DIAGNOSIS — M21612 Bunion of left foot: Secondary | ICD-10-CM | POA: Diagnosis not present

## 2020-06-28 DIAGNOSIS — M21611 Bunion of right foot: Secondary | ICD-10-CM | POA: Diagnosis not present

## 2020-06-28 DIAGNOSIS — M2042 Other hammer toe(s) (acquired), left foot: Secondary | ICD-10-CM | POA: Diagnosis not present

## 2020-07-27 ENCOUNTER — Emergency Department (HOSPITAL_COMMUNITY): Payer: Medicare Other

## 2020-07-27 ENCOUNTER — Encounter (HOSPITAL_COMMUNITY): Payer: Self-pay

## 2020-07-27 ENCOUNTER — Emergency Department (HOSPITAL_COMMUNITY)
Admission: EM | Admit: 2020-07-27 | Discharge: 2020-07-28 | Disposition: A | Discharge status: Home / Self Care | Payer: Medicare Other | Attending: Emergency Medicine | Admitting: Emergency Medicine

## 2020-07-27 ENCOUNTER — Other Ambulatory Visit: Payer: Self-pay

## 2020-07-27 DIAGNOSIS — I313 Pericardial effusion (noninflammatory): Secondary | ICD-10-CM | POA: Diagnosis not present

## 2020-07-27 DIAGNOSIS — W0110XA Fall on same level from slipping, tripping and stumbling with subsequent striking against unspecified object, initial encounter: Secondary | ICD-10-CM | POA: Insufficient documentation

## 2020-07-27 DIAGNOSIS — W19XXXA Unspecified fall, initial encounter: Secondary | ICD-10-CM

## 2020-07-27 DIAGNOSIS — I714 Abdominal aortic aneurysm, without rupture, unspecified: Secondary | ICD-10-CM

## 2020-07-27 DIAGNOSIS — K8020 Calculus of gallbladder without cholecystitis without obstruction: Secondary | ICD-10-CM | POA: Diagnosis not present

## 2020-07-27 DIAGNOSIS — S0990XA Unspecified injury of head, initial encounter: Secondary | ICD-10-CM | POA: Diagnosis not present

## 2020-07-27 DIAGNOSIS — S0003XA Contusion of scalp, initial encounter: Secondary | ICD-10-CM | POA: Insufficient documentation

## 2020-07-27 DIAGNOSIS — Z79899 Other long term (current) drug therapy: Secondary | ICD-10-CM | POA: Diagnosis not present

## 2020-07-27 DIAGNOSIS — S3991XA Unspecified injury of abdomen, initial encounter: Secondary | ICD-10-CM | POA: Diagnosis not present

## 2020-07-27 DIAGNOSIS — S0011XA Contusion of right eyelid and periocular area, initial encounter: Secondary | ICD-10-CM | POA: Diagnosis not present

## 2020-07-27 DIAGNOSIS — N183 Chronic kidney disease, stage 3 unspecified: Secondary | ICD-10-CM | POA: Insufficient documentation

## 2020-07-27 DIAGNOSIS — J90 Pleural effusion, not elsewhere classified: Secondary | ICD-10-CM | POA: Diagnosis not present

## 2020-07-27 DIAGNOSIS — R0789 Other chest pain: Secondary | ICD-10-CM | POA: Insufficient documentation

## 2020-07-27 DIAGNOSIS — I517 Cardiomegaly: Secondary | ICD-10-CM | POA: Diagnosis not present

## 2020-07-27 DIAGNOSIS — R059 Cough, unspecified: Secondary | ICD-10-CM | POA: Diagnosis not present

## 2020-07-27 DIAGNOSIS — K5730 Diverticulosis of large intestine without perforation or abscess without bleeding: Secondary | ICD-10-CM | POA: Diagnosis not present

## 2020-07-27 DIAGNOSIS — I5042 Chronic combined systolic (congestive) and diastolic (congestive) heart failure: Secondary | ICD-10-CM | POA: Diagnosis not present

## 2020-07-27 DIAGNOSIS — Y92002 Bathroom of unspecified non-institutional (private) residence single-family (private) house as the place of occurrence of the external cause: Secondary | ICD-10-CM | POA: Insufficient documentation

## 2020-07-27 DIAGNOSIS — R053 Chronic cough: Secondary | ICD-10-CM | POA: Diagnosis not present

## 2020-07-27 DIAGNOSIS — N179 Acute kidney failure, unspecified: Secondary | ICD-10-CM | POA: Insufficient documentation

## 2020-07-27 DIAGNOSIS — I130 Hypertensive heart and chronic kidney disease with heart failure and stage 1 through stage 4 chronic kidney disease, or unspecified chronic kidney disease: Secondary | ICD-10-CM | POA: Diagnosis not present

## 2020-07-27 DIAGNOSIS — Z043 Encounter for examination and observation following other accident: Secondary | ICD-10-CM | POA: Diagnosis not present

## 2020-07-27 DIAGNOSIS — R011 Cardiac murmur, unspecified: Secondary | ICD-10-CM | POA: Diagnosis not present

## 2020-07-27 DIAGNOSIS — K838 Other specified diseases of biliary tract: Secondary | ICD-10-CM | POA: Diagnosis not present

## 2020-07-27 MED ORDER — ACETAMINOPHEN 325 MG PO TABS
650.0000 mg | ORAL_TABLET | Freq: Four times a day (QID) | ORAL | Status: AC | PRN
  Administered 2020-07-27: 650 mg via ORAL
  Filled 2020-07-27: qty 2

## 2020-07-27 NOTE — ED Provider Notes (Signed)
Julia Rob[facility] DEPT Provider Note   CSN: [id number] Arrival date & time: 07/27/20  2219     History Chief Complaint  Patient presents with  . Fall    Julia Robbins is a 86 y.o. 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  The patient has fallen twice in the last 2 days. Yesterday, she fell in the bathroom and hit the left side of her face.  Her daughter noted bruising around her left eye.  She did not not have a syncopal episode.  She was able to ambulate with her walker at baseline after the fall yesterday.  Prior to the 2 falls over the last 2 days, she has not a history of falls.  Earlier tonight, the patient was getting up from the dinner table and fell onto her right side.  Her daughter noticed a lump on the right side of her head and bruising around her right eye from the fall.  She is endorsing pain in her right ribs.  She denied a syncopal episode.  She was able to get up from the fall with her daughter's assistance and has been able to ambulate with her walker since the fall, which occurred just prior to arrival.  She does not take any blood thinners.  She denies visual changes, chest pain, shortness of breath, dysuria, neck pain, back pain, hip pain.  Her daughter reports that she has had a progressively worsening cough since the beginning of the year.  Cough is initially dry, but is becoming increasingly productive.  She has been prescribed multiple cough medications by primary care without improvement in her symptoms.  Her daughter also reports that she does have poor fluid intake and may only drink 1 glass or bottle of water daily.  The patient does  endorse dizziness and lightheadedness with standing, which is chronic.  Her daughter reports that at her next visit with her PCP on 11/11 that they are planning to address home health.  The patient lives with her daughter, but her daughter works outside of the home 4 to 6 hours daily.  She does have security cameras in the home where she is able to check in on the patient during most of the day.  Family also tries to check on the patient when they are available.  They report that her mobility has been slowly declining over the last year and a half since the Covid-19 pandemic started.  The history is provided by the patient and medical records. No language interpreter was used.       Past Medical History:  Diagnosis Date  . Abnormal EKG    hx left bundle branch block dating back to 2012 on old ekg's  . Arthritis    degenerative.   . Cholelithiasis 2010   15 mm CBD on CT scan 2010.   Marland Kitchen Colon cancer ([place]) 1998   chemo and colectomy.  T3 N1 stage 3, 2/15 nodes +.   . DVT (deep venous thrombosis) ([facility])    right leg  . Elevated cholesterol   . Family history of adverse reaction to anesthesia    daughter has post op n/v  . Gastric ulcer 1998  . [place] filter in place 1998  for DVT. right leg  . HOH (hard of hearing)    slight    Patient Active Problem List   Diagnosis Date Noted  . Symptomatic anemia   . Acute combined systolic and diastolic heart failure ([place])   . Moderate aortic stenosis   . Moderate aortic regurgitation   . Acute lower GI bleeding 09/04/2019  . Hematemesis 09/04/2019  . Cholelithiasis 09/04/2019  . Abnormal abdominal CT scan   . RLQ abdominal pain   . Acute blood loss anemia 12/02/2015  . CKD (chronic kidney disease) stage 3, GFR 30-59 ml/min ([facility]) 12/02/2015  . Gastrointestinal hemorrhage 12/01/2015  . Acute kidney injury ([place]) 12/01/2015  . Elevated lactic acid level 12/01/2015  . Essential hypertension 12/01/2015  . History of DVT (deep vein  thrombosis) 10/31/2012  . Hyperlipidemia 10/31/2012  . History of colon cancer 03/29/2010    Past Surgical History:  Procedure Laterality Date  . COLON SURGERY  1998  . COLONOSCOPY WITH PROPOFOL N/A 04/12/2017   Procedure: COLONOSCOPY WITH PROPOFOL;  Surgeon: Milus Banister, MD;  Location: [facility];  Service: Endoscopy;  Laterality: N/A;  . DILATION AND CURETTAGE OF UTERUS  yrs ago  . ESOPHAGOGASTRODUODENOSCOPY (EGD) WITH PROPOFOL N/A 03/08/2017   Procedure: ESOPHAGOGASTRODUODENOSCOPY (EGD) WITH PROPOFOL;  Surgeon: Milus Banister, MD;  Location: [facility];  Service: Endoscopy;  Laterality: N/A;  . ESOPHAGOGASTRODUODENOSCOPY (EGD) WITH PROPOFOL N/A 09/05/2019   Procedure: ESOPHAGOGASTRODUODENOSCOPY (EGD) WITH PROPOFOL;  Surgeon: Yetta Flock, MD;  Location: [facility];  Service: Gastroenterology;  Laterality: N/A;     OB History   No obstetric history on file.     Family History  Problem Relation Age of Onset  . Heart disease Mother   . Colon cancer Neg Hx   . Stomach cancer Neg Hx   . Esophageal cancer Neg Hx   . Pancreatic cancer Neg Hx   . Liver disease Neg Hx     Social History   Tobacco Use  . Smoking status: Never Smoker  . Smokeless tobacco: Never Used  Vaping Use  . Vaping Use: Never used  Substance Use Topics  . Alcohol use: No  . Drug use: No    Home Medications Prior to Admission medications   Medication Sig Start Date End Date Taking? Authorizing Provider  acetaminophen (TYLENOL) 500 MG tablet Take 1,000 mg by mouth 2 (two) times daily as needed for moderate pain or headache.   Yes [provider]  Cholecalciferol (VITAMIN D-3) 1000 units CAPS Take 1 tablet by mouth in the morning and at bedtime.    Yes [provider]  Dextromethorphan-guaiFENesin (MUCUS-DM) 30-600 MG TB12 Take 1 tablet by mouth in the morning and at bedtime.   Yes [provider]  ferrous sulfate 325 (65 FE) MG tablet Take 325 mg by mouth 2  (two) times daily with a meal.    Yes [provider]  furosemide (LASIX) 40 MG tablet Take 80 mg by mouth daily.   Yes [provider]  nortriptyline (PAMELOR) 10 MG capsule Take 10 mg by mouth 2 (two) times daily.   Yes [provider]  pantoprazole (PROTONIX) 40 MG tablet Take 1 tablet (40 mg total) by mouth 2 (two) times daily before a meal. 09/09/19 07/28/20 Yes Rai, Ripudeep K, MD  potassium chloride SA (KLOR-CON) 20 MEQ tablet Take 20 mEq by mouth 2 (two) times daily.   Yes [provider]  vitamin B-12 (CYANOCOBALAMIN) 1000 MCG tablet Take 1,000 mcg by mouth  daily.   Yes [provider]  donepezil (ARICEPT) 10 MG tablet Take 10 mg by mouth at bedtime.  07/28/20  [provider]  metoprolol succinate (TOPROL-XL) 25 MG 24 hr tablet Take 1 tablet (25 mg total) by mouth daily. Patient not taking: Reported on 07/28/2020 09/09/19 07/28/20  Rai, Vernelle Emerald, MD  sucralfate (CARAFATE) 1 GM/10ML suspension Take 10 mLs (1 g total) by mouth 4 (four) times daily -  with meals and at bedtime. Patient not taking: Reported on 07/28/2020 09/09/19 07/28/20  Mendel Corning, MD    Allergies    Patient has no known allergies.  Review of Systems   Review of Systems  Constitutional: Negative for activity change, chills and fever.  HENT: Negative for congestion, dental problem and sore throat.   Eyes: Negative for visual disturbance.  Respiratory: Positive for cough and wheezing. Negative for shortness of breath.   Cardiovascular: Negative for chest pain, palpitations and leg swelling.  Gastrointestinal: Positive for diarrhea. Negative for abdominal pain, nausea and vomiting.  Endocrine: Negative for polyuria.  Genitourinary: Positive for flank pain. Negative for dysuria, frequency, urgency and vaginal bleeding.  Musculoskeletal: Positive for arthralgias and myalgias. Negative for back pain, neck pain and neck stiffness.  Skin: Negative for rash.   Allergic/Immunologic: Negative for immunocompromised state.  Neurological: Positive for headaches. Negative for dizziness, seizures, syncope, weakness and numbness.  Psychiatric/Behavioral: Negative for confusion.    Physical Exam Updated Vital Signs BP 127/80   Pulse 97   Temp 98 F (36.7 C)   Resp 17   SpO2 98%   Physical Exam Vitals and nursing note reviewed.  Constitutional:      General: She is not in acute distress. HENT:     Head: Normocephalic. No Battle's sign.     Jaw: There is normal jaw occlusion.     Comments: Ecchymosis noted to the lateral aspects of the periorbital regions bilaterally.  She has tenderness to palpation to the inferior periorbital region on the right.  No left-sided periorbital tenderness.  Small hematoma noted to the right temporal scalp.  No lacerations.    Nose: Nose normal.  Eyes:     Extraocular Movements: Extraocular movements intact.     Conjunctiva/sclera: Conjunctivae normal.     Right eye: Right conjunctiva is not injected. No hemorrhage.    Left eye: Left conjunctiva is not injected. No hemorrhage.    Pupils: Pupils are equal, round, and reactive to light.  Cardiovascular:     Rate and Rhythm: Normal rate and regular rhythm.     Pulses: Normal pulses.     Heart sounds: Murmur heard.  No friction rub. No gallop.   Pulmonary:     Effort: Pulmonary effort is normal. No respiratory distress.     Breath sounds: No stridor. No wheezing, rhonchi or rales.     Comments: Tender to palpation to the right lateral inferior ribs.  No crepitus or step-offs.  No increased work of breathing.  Lungs are clear to auscultation bilaterally.  Rhonchorous sounding cough noted on exam. Chest:     Chest wall: Tenderness present.  Abdominal:     General: There is no distension.     Palpations: Abdomen is soft. There is no mass.     Tenderness: There is abdominal tenderness. There is no right CVA tenderness, left CVA tenderness, guarding or rebound.      Hernia: No hernia is present.     Comments: Tender to palpation in the bilateral upper abdomen  and left lower abdomen.  No rebound or guarding.  Abdomen is soft and nondistended.  Negative Murphy sign.  No CVA tenderness bilaterally.  No masses or hepatosplenomegaly.  Musculoskeletal:     Cervical back: Neck supple.     Comments: No tenderness to palpation to the cervical, thoracic, or lumbar spinous processes or bilateral paraspinal muscles.  No crepitus or step-offs.  Pelvis is nontender and stable.  No tenderness to the joints of the bilateral upper or lower extremities.  Skin:    General: Skin is warm.     Findings: No rash.     Comments: No ecchymosis noted to the thorax or abdomen.  Neurological:     Mental Status: She is alert.     Comments: Oriented to self.  She knows the state of [place].  She knows that she is currently in the hospital.  She does not know the month, day, year.  She thinks the president is [place].  GCS 14 due to confusion.  Moves all 4 extremities spontaneously.  Cranial nerves II through XII are grossly intact.  Sensation is intact and equal throughout. FTN intact bilaterally.  5-5 strength against resistance of the bilateral upper and lower extremities.  Psychiatric:        Behavior: Behavior normal.     ED Results / Procedures / Treatments   Labs (all labs ordered are listed, but only abnormal results are displayed) Labs Reviewed  COMPREHENSIVE METABOLIC PANEL - Abnormal; Notable for the following components:      Result Value   Glucose, Bld 104 (*)    Creatinine, Ser 1.40 (*)    Albumin 3.2 (*)    GFR, Estimated 35 (*)    All other components within normal limits  CBC  URINALYSIS, ROUTINE W REFLEX MICROSCOPIC    EKG None  Radiology CT ABDOMEN PELVIS WO CONTRAST  Result Date: 07/28/2020 CLINICAL DATA:  86 year old post abdominal trauma. Two falls this week. EXAM: CT CHEST, ABDOMEN AND PELVIS WITHOUT CONTRAST TECHNIQUE: Multidetector CT  imaging of the chest, abdomen and pelvis was performed following the standard protocol without IV contrast. COMPARISON:  Chest radiograph yesterday. Abdominopelvic CT 09/04/2019 FINDINGS: CT CHEST FINDINGS Cardiovascular: Mild cardiomegaly. Tortuous elongated thoracic aorta with atherosclerosis. Ascending aorta mildly aneurysmal 4.1 cm. No periaortic stranding. Small pericardial effusion. This appears similar to the included on abdominal CT 09/04/2019. Mediastinum/Nodes: No mediastinal hemorrhage. Patulous esophagus without wall thickening. No pneumomediastinum. No enlarged mediastinal lymph nodes. 10 mm left thyroid nodule. In the setting of significant comorbidities or limited life expectancy, no follow-up recommended (ref: J Am Coll Radiol. 2015 Feb;12(2): 143-50). Lungs/Pleura: Peripheral subpleural reticulation throughout both lungs without definite apical basilar gradient. Right upper lobe bronchiectasis and areas of bronchial thickening. No pneumothorax. No pulmonary mass. No significant pleural effusion. Musculoskeletal: No acute fracture of the ribs, sternum, included clavicles or shoulder girdles. Scoliosis with diffuse thoracic spondylosis without acute fracture. No confluent chest wall contusion. CT ABDOMEN PELVIS FINDINGS Hepatobiliary: Limited assessment for injury in the absence of IV contrast. Allowing for this, no focal liver abnormality is seen. There is no perihepatic hematoma. Multiple intraluminal gallstones in the gallbladder. Chronically dilated common bile duct measuring up to 15 mm, unchanged. Questionable stone in the mid CBD, series 2, image 48. There is streak artifact in the distal common bile duct from adjacent IVC filter. Pancreas: Fatty atrophy. No evidence of injury or acute inflammation. Spleen: Limited assessment for injury in the absence of IV contrast. No focal abnormality or perisplenic  fluid. Adrenals/Urinary Tract: Normal adrenal glands. Thinning of bilateral renal parenchyma.  No hydronephrosis. No evidence of renal injury on noncontrast exam. No renal calculi. Small posterior right bladder diverticulum. No bladder wall thickening. Stomach/Bowel: No bowel wall thickening, inflammation, or evidence of injury. Sigmoid diverticulosis. Partial colectomy with enteric sutures noted in the proximal sigmoid. Suspected duodenal diverticulum without acute inflammation. Vascular/Lymphatic: Aorto bi-iliac atherosclerosis and tortuosity. No abdominal aortic aneurysm. Infrarenal IVC filter in place. No bulky abdominopelvic adenopathy. Prominent right internal and external iliac nodes are unchanged from prior exam, largest 10 mm. Reproductive: Small calcification in the uterine fundus likely fibroid. No adnexal mass. Other: No free fluid or free air. There is no confluent body wall contusion. There are surgical clips in the left upper quadrant. Musculoskeletal: Lumbar spondylosis with multilevel degenerative change. No fracture of the pelvis or lumbar spine. IMPRESSION: 1. No evidence of acute traumatic injury to the chest, abdomen, or pelvis allowing for lack of IV contrast. No rib fracture. 2. Again seen gallstones. Chronically dilated common bile duct measuring up to 15 mm, unchanged from CT 09/04/2019. Questionable stone in the mid CBD. Recommend correlation with LFTs. While MRCP could be considered for further evaluation, this would only be diagnostic if patient is able to tolerate breath hold technique. 3. Chronic findings include cardiomegaly, small pericardial effusion, and colonic diverticulosis. Aortic Atherosclerosis (ICD10-I70.0). Electronically Signed   By: Keith Rake M.D.   On: 07/28/2020 02:19   DG Chest 2 View  Result Date: 07/27/2020 CLINICAL DATA:  Fall onto right side tonight, worsening cough. EXAM: CHEST - 2 VIEW COMPARISON:  Chest radiograph 12/01/2015 FINDINGS: Chronic elevation of right hemidiaphragm. Stable cardiomegaly. Unchanged mediastinal contours. No pneumothorax  or large pleural effusion. Mild vascular congestion without convincing pulmonary edema. No confluent consolidation. No visualized rib fracture or acute osseous abnormality. IMPRESSION: 1. Mild vascular congestion. 2. Chronic cardiomegaly.  Chronic elevation of right hemidiaphragm. Electronically Signed   By: Keith Rake M.D.   On: 07/27/2020 23:51   CT Head Wo Contrast  Result Date: 07/28/2020 CLINICAL DATA:  Fall EXAM: CT CERVICAL SPINE WITHOUT CONTRAST TECHNIQUE: Multidetector CT imaging of the cervical spine was performed without intravenous contrast. Multiplanar CT image reconstructions were also generated. COMPARISON:  None. FINDINGS: Brain: No evidence of acute territorial infarction, hemorrhage, hydrocephalus,extra-axial collection or mass lesion/mass effect. There is dilatation the ventricles and sulci consistent with age-related atrophy. Low-attenuation changes in the deep white matter consistent with small vessel ischemia. Vascular: No hyperdense vessel or unexpected calcification. Skull: The skull is intact. No fracture or focal lesion identified. Sinuses/Orbits: The visualized paranasal sinuses and mastoid air cells are clear. The orbits and globes intact. Other: None Face: Osseous: No acute fracture or other significant osseous abnormality.The nasal bone, mandibles, zygomatic arches and pterygoid plates are intact. Orbits: No fracture identified. Unremarkable appearance of globes and orbits. Sinuses: The visualized paranasal sinuses and mastoid air cells are unremarkable. Soft tissues:  No acute findings. Limited intracranial: No acute findings. Cervical spine: Alignment: Physiologic Skull base and vertebrae: Visualized skull base is intact. No atlanto-occipital dissociation. The vertebral body heights are well maintained. No fracture or pathologic osseous lesion seen. Soft tissues and spinal canal: The visualized paraspinal soft tissues are unremarkable. No prevertebral soft tissue swelling  is seen. The spinal canal is grossly unremarkable, no large epidural collection or significant canal narrowing. Disc levels: Disc height loss with uncovertebral osteophytes and disc osteophyte complexes most notable at C3 through C5 with mild neural foraminal narrowing and mild effacement  anterior thecal sac. Upper chest: Nonspecific partially visualized left upper lung patchy airspace opacity seen. Thoracic inlet is within normal limits. Other: None IMPRESSION: No acute intracranial abnormality. No acute facial fracture Findings consistent with age related atrophy and chronic small vessel ischemia No acute fracture or malalignment of the spine. Partially visualized patchy airspace opacity in the left upper lung which is nonspecific. Electronically Signed   By: Prudencio Pair M.D.   On: 07/28/2020 00:05   CT Chest Wo Contrast  Result Date: 07/28/2020 CLINICAL DATA:  86 year old post abdominal trauma. Two falls this week. EXAM: CT CHEST, ABDOMEN AND PELVIS WITHOUT CONTRAST TECHNIQUE: Multidetector CT imaging of the chest, abdomen and pelvis was performed following the standard protocol without IV contrast. COMPARISON:  Chest radiograph yesterday. Abdominopelvic CT 09/04/2019 FINDINGS: CT CHEST FINDINGS Cardiovascular: Mild cardiomegaly. Tortuous elongated thoracic aorta with atherosclerosis. Ascending aorta mildly aneurysmal 4.1 cm. No periaortic stranding. Small pericardial effusion. This appears similar to the included on abdominal CT 09/04/2019. Mediastinum/Nodes: No mediastinal hemorrhage. Patulous esophagus without wall thickening. No pneumomediastinum. No enlarged mediastinal lymph nodes. 10 mm left thyroid nodule. In the setting of significant comorbidities or limited life expectancy, no follow-up recommended (ref: J Am Coll Radiol. 2015 Feb;12(2): 143-50). Lungs/Pleura: Peripheral subpleural reticulation throughout both lungs without definite apical basilar gradient. Right upper lobe bronchiectasis and  areas of bronchial thickening. No pneumothorax. No pulmonary mass. No significant pleural effusion. Musculoskeletal: No acute fracture of the ribs, sternum, included clavicles or shoulder girdles. Scoliosis with diffuse thoracic spondylosis without acute fracture. No confluent chest wall contusion. CT ABDOMEN PELVIS FINDINGS Hepatobiliary: Limited assessment for injury in the absence of IV contrast. Allowing for this, no focal liver abnormality is seen. There is no perihepatic hematoma. Multiple intraluminal gallstones in the gallbladder. Chronically dilated common bile duct measuring up to 15 mm, unchanged. Questionable stone in the mid CBD, series 2, image 48. There is streak artifact in the distal common bile duct from adjacent IVC filter. Pancreas: Fatty atrophy. No evidence of injury or acute inflammation. Spleen: Limited assessment for injury in the absence of IV contrast. No focal abnormality or perisplenic fluid. Adrenals/Urinary Tract: Normal adrenal glands. Thinning of bilateral renal parenchyma. No hydronephrosis. No evidence of renal injury on noncontrast exam. No renal calculi. Small posterior right bladder diverticulum. No bladder wall thickening. Stomach/Bowel: No bowel wall thickening, inflammation, or evidence of injury. Sigmoid diverticulosis. Partial colectomy with enteric sutures noted in the proximal sigmoid. Suspected duodenal diverticulum without acute inflammation. Vascular/Lymphatic: Aorto bi-iliac atherosclerosis and tortuosity. No abdominal aortic aneurysm. Infrarenal IVC filter in place. No bulky abdominopelvic adenopathy. Prominent right internal and external iliac nodes are unchanged from prior exam, largest 10 mm. Reproductive: Small calcification in the uterine fundus likely fibroid. No adnexal mass. Other: No free fluid or free air. There is no confluent body wall contusion. There are surgical clips in the left upper quadrant. Musculoskeletal: Lumbar spondylosis with multilevel  degenerative change. No fracture of the pelvis or lumbar spine. IMPRESSION: 1. No evidence of acute traumatic injury to the chest, abdomen, or pelvis allowing for lack of IV contrast. No rib fracture. 2. Again seen gallstones. Chronically dilated common bile duct measuring up to 15 mm, unchanged from CT 09/04/2019. Questionable stone in the mid CBD. Recommend correlation with LFTs. While MRCP could be considered for further evaluation, this would only be diagnostic if patient is able to tolerate breath hold technique. 3. Chronic findings include cardiomegaly, small pericardial effusion, and colonic diverticulosis. Aortic Atherosclerosis (ICD10-I70.0). Electronically Signed  By: Keith Rake M.D.   On: 07/28/2020 02:19   CT Cervical Spine Wo Contrast  Result Date: 07/28/2020 CLINICAL DATA:  Fall EXAM: CT CERVICAL SPINE WITHOUT CONTRAST TECHNIQUE: Multidetector CT imaging of the cervical spine was performed without intravenous contrast. Multiplanar CT image reconstructions were also generated. COMPARISON:  None. FINDINGS: Brain: No evidence of acute territorial infarction, hemorrhage, hydrocephalus,extra-axial collection or mass lesion/mass effect. There is dilatation the ventricles and sulci consistent with age-related atrophy. Low-attenuation changes in the deep white matter consistent with small vessel ischemia. Vascular: No hyperdense vessel or unexpected calcification. Skull: The skull is intact. No fracture or focal lesion identified. Sinuses/Orbits: The visualized paranasal sinuses and mastoid air cells are clear. The orbits and globes intact. Other: None Face: Osseous: No acute fracture or other significant osseous abnormality.The nasal bone, mandibles, zygomatic arches and pterygoid plates are intact. Orbits: No fracture identified. Unremarkable appearance of globes and orbits. Sinuses: The visualized paranasal sinuses and mastoid air cells are unremarkable. Soft tissues:  No acute findings. Limited  intracranial: No acute findings. Cervical spine: Alignment: Physiologic Skull base and vertebrae: Visualized skull base is intact. No atlanto-occipital dissociation. The vertebral body heights are well maintained. No fracture or pathologic osseous lesion seen. Soft tissues and spinal canal: The visualized paraspinal soft tissues are unremarkable. No prevertebral soft tissue swelling is seen. The spinal canal is grossly unremarkable, no large epidural collection or significant canal narrowing. Disc levels: Disc height loss with uncovertebral osteophytes and disc osteophyte complexes most notable at C3 through C5 with mild neural foraminal narrowing and mild effacement anterior thecal sac. Upper chest: Nonspecific partially visualized left upper lung patchy airspace opacity seen. Thoracic inlet is within normal limits. Other: None IMPRESSION: No acute intracranial abnormality. No acute facial fracture Findings consistent with age related atrophy and chronic small vessel ischemia No acute fracture or malalignment of the spine. Partially visualized patchy airspace opacity in the left upper lung which is nonspecific. Electronically Signed   By: Prudencio Pair M.D.   On: 07/28/2020 00:05   CT Maxillofacial Wo Contrast  Result Date: 07/28/2020 CLINICAL DATA:  Fall EXAM: CT CERVICAL SPINE WITHOUT CONTRAST TECHNIQUE: Multidetector CT imaging of the cervical spine was performed without intravenous contrast. Multiplanar CT image reconstructions were also generated. COMPARISON:  None. FINDINGS: Brain: No evidence of acute territorial infarction, hemorrhage, hydrocephalus,extra-axial collection or mass lesion/mass effect. There is dilatation the ventricles and sulci consistent with age-related atrophy. Low-attenuation changes in the deep white matter consistent with small vessel ischemia. Vascular: No hyperdense vessel or unexpected calcification. Skull: The skull is intact. No fracture or focal lesion identified.  Sinuses/Orbits: The visualized paranasal sinuses and mastoid air cells are clear. The orbits and globes intact. Other: None Face: Osseous: No acute fracture or other significant osseous abnormality.The nasal bone, mandibles, zygomatic arches and pterygoid plates are intact. Orbits: No fracture identified. Unremarkable appearance of globes and orbits. Sinuses: The visualized paranasal sinuses and mastoid air cells are unremarkable. Soft tissues:  No acute findings. Limited intracranial: No acute findings. Cervical spine: Alignment: Physiologic Skull base and vertebrae: Visualized skull base is intact. No atlanto-occipital dissociation. The vertebral body heights are well maintained. No fracture or pathologic osseous lesion seen. Soft tissues and spinal canal: The visualized paraspinal soft tissues are unremarkable. No prevertebral soft tissue swelling is seen. The spinal canal is grossly unremarkable, no large epidural collection or significant canal narrowing. Disc levels: Disc height loss with uncovertebral osteophytes and disc osteophyte complexes most notable at C3 through C5 with  mild neural foraminal narrowing and mild effacement anterior thecal sac. Upper chest: Nonspecific partially visualized left upper lung patchy airspace opacity seen. Thoracic inlet is within normal limits. Other: None IMPRESSION: No acute intracranial abnormality. No acute facial fracture Findings consistent with age related atrophy and chronic small vessel ischemia No acute fracture or malalignment of the spine. Partially visualized patchy airspace opacity in the left upper lung which is nonspecific. Electronically Signed   By: Prudencio Pair M.D.   On: 07/28/2020 00:05    Procedures Procedures (including critical care time)  Medications Ordered in ED Medications  acetaminophen (TYLENOL) tablet 650 mg (650 mg Oral Given 07/27/20 2322)  sodium chloride 0.9 % bolus 500 mL (0 mLs Intravenous Stopped 07/28/20 0330)    ED Course   I have reviewed the triage vital signs and the nursing notes.  Pertinent labs & imaging results that were available during my care of the patient were reviewed by me and considered in my medical decision making (see chart for details).    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  She is endorsing right-sided chest and abdominal pain and has bruising noted to the bilateral periorbital regions and a right-sided temporal hematoma.  She is accompanied to the emergency department by her daughter.  Vital signs are normal.  She is at her baseline mentation.  Aside from confusion, she has no acute neurologic deficits.   This patient presents to the ED for concern of fall this involves an extensive number of treatment options as patient does not have a history of falls, and is a complaint that carries with it a high risk of complications and morbidity.  The differential diagnosis includes fall secondary to orthostatic hypotension, dehydration, UTI, community-acquired pneumonia, rib fractures secondary to fall, acute injury to solid and hollow abdominal viscera.    Lab Tests:    I Ordered, reviewed, and interpreted labs, which included  CBC, metabolic panel, and UA that showed creatinine of 1.40, AKI.    Medicines ordered:    I ordered 500 cc of IV fluids and as needed Tylenol.   Imaging Studies ordered:    I ordered imaging studies which included  CT maxillofacial, head, cervical spine, chest, and abdomen and a chest x-ray  I independently visualized and interpreted imaging which did not demonstrate any acute findings, but did demonstrate an ascending abdominal aortic aneurysm  and chronically dilated common bile duct and multiple gallstones.  No evidence of cholecystitis or choledocholithiasis.   Additional history obtained:    Previous records obtained and reviewed    Reevaluation:   After the interventions stated above, I reevaluated the patient and found improved after IV fluids.  All labs and imaging were discussed at bedside with the patient's daughter, who is in agreement with work-up and plan.  Following IV fluids, patient reports that she was feeling much improved and was ambulated with her walker in the department and was at her baseline.  I discussed with the patient's daughter that she can follow-up in the outpatient setting for abdominal aortic aneurysm given the patient's age and the size of the aneurysm, likely  no need for intervention.  However, we did discuss risks of known AAA.  We also discussed chronic gallbladder findings.  She will be given a referral to pulmonology for her chronic cough.  Discussed there is no evidence of community-acquired pneumonia on CT. patient was agreeable with this plan.  All questions answered.  She is hemodynamically stable and in no acute distress.  Safe for discharged home with outpatient follow-up as indicated.    Final Clinical Impression(s) / ED Diagnoses Final diagnoses:  Fall from standing, initial encounter  AKI (acute kidney injury) ([place])  Calculus of gallbladder without cholecystitis without obstruction  Abdominal aortic aneurysm (AAA) without rupture ([place])  Chronic cough    Rx / DC Orders ED Discharge Orders    None       Joanne Gavel, PA-C 07/28/20 3888    Merrily Pew, MD 07/28/20 2351

## 2020-07-27 NOTE — ED Triage Notes (Signed)
Patient arrived with family who states she has falling two times this week. Reports tonight she fell and has a knot on the right side of her head. Family states she does not remember falling. Declines LOC but family states she was more disorientated than normal.

## 2020-07-28 ENCOUNTER — Emergency Department (HOSPITAL_COMMUNITY): Payer: Medicare Other

## 2020-07-28 DIAGNOSIS — K5730 Diverticulosis of large intestine without perforation or abscess without bleeding: Secondary | ICD-10-CM | POA: Diagnosis not present

## 2020-07-28 DIAGNOSIS — S3991XA Unspecified injury of abdomen, initial encounter: Secondary | ICD-10-CM | POA: Diagnosis not present

## 2020-07-28 DIAGNOSIS — I313 Pericardial effusion (noninflammatory): Secondary | ICD-10-CM | POA: Diagnosis not present

## 2020-07-28 DIAGNOSIS — K8020 Calculus of gallbladder without cholecystitis without obstruction: Secondary | ICD-10-CM | POA: Diagnosis not present

## 2020-07-28 DIAGNOSIS — K838 Other specified diseases of biliary tract: Secondary | ICD-10-CM | POA: Diagnosis not present

## 2020-07-28 LAB — CBC
HCT: 39.3 % (ref 36.0–46.0)
Hemoglobin: 12.9 g/dL (ref 12.0–15.0)
MCH: 31.4 pg (ref 26.0–34.0)
MCHC: 32.8 g/dL (ref 30.0–36.0)
MCV: 95.6 fL (ref 80.0–100.0)
Platelets: 211 10*3/uL (ref 150–400)
RBC: 4.11 MIL/uL (ref 3.87–5.11)
RDW: 12.9 % (ref 11.5–15.5)
WBC: 6.9 10*3/uL (ref 4.0–10.5)
nRBC: 0 % (ref 0.0–0.2)

## 2020-07-28 LAB — COMPREHENSIVE METABOLIC PANEL
ALT: 11 U/L (ref 0–44)
AST: 17 U/L (ref 15–41)
Albumin: 3.2 g/dL — ABNORMAL LOW (ref 3.5–5.0)
Alkaline Phosphatase: 67 U/L (ref 38–126)
Anion gap: 10 (ref 5–15)
BUN: 12 mg/dL (ref 8–23)
CO2: 30 mmol/L (ref 22–32)
Calcium: 9.2 mg/dL (ref 8.9–10.3)
Chloride: 102 mmol/L (ref 98–111)
Creatinine, Ser: 1.4 mg/dL — ABNORMAL HIGH (ref 0.44–1.00)
GFR, Estimated: 35 mL/min — ABNORMAL LOW (ref >60)
Glucose, Bld: 104 mg/dL — ABNORMAL HIGH (ref 70–99)
Potassium: 4.5 mmol/L (ref 3.5–5.1)
Sodium: 142 mmol/L (ref 135–145)
Total Bilirubin: 0.6 mg/dL (ref 0.3–1.2)
Total Protein: 7.5 g/dL (ref 6.5–8.1)

## 2020-07-28 LAB — URINALYSIS, ROUTINE W REFLEX MICROSCOPIC
Bilirubin Urine: NEGATIVE
Glucose, UA: NEGATIVE mg/dL
Hgb urine dipstick: NEGATIVE
Ketones, ur: NEGATIVE mg/dL
Leukocytes,Ua: NEGATIVE
Nitrite: NEGATIVE
Protein, ur: NEGATIVE mg/dL
Specific Gravity, Urine: 1.01 (ref 1.005–1.030)
pH: 5 (ref 5.0–8.0)

## 2020-07-28 MED ORDER — SODIUM CHLORIDE 0.9 % IV BOLUS
500.0000 mL | Freq: Once | INTRAVENOUS | Status: AC
  Administered 2020-07-28: 500 mL via INTRAVENOUS

## 2020-07-28 NOTE — Discharge Instructions (Signed)
Thank you for allowing me to care for you today in the Emergency Department.   You can call to establish with a geriatric specialist to take over her primary care.  The office information is listed above.  I also provided you with a referral to pulmonology for further work-up and evaluation of your cough.  As we discussed, you did have an aneurysm of the ascending abdominal aorta.  Primary care can continue to follow this.  Your lab work did show that you were slightly dehydrated today.  This was treated with IV fluids.  There are no injuries associated with your falls.  However, for pain, you can take 650 mg of Tylenol once every 6 hours.   You can apply an ice pack to the lump on your scalp to the bruising around your eyes to help with pain and swelling.  These will resolve on their own in the next few days.  Return to the emergency department if you continue to have recurrent falls, if you pass out, if you develop uncontrollable vomiting, severe abdominal pain, new numbness or weakness, or other new, concerning symptoms.

## 2020-07-28 NOTE — ED Notes (Signed)
Patient transported to CT 

## 2020-07-28 NOTE — ED Notes (Signed)
Pt ambulated steady with walker.

## 2020-07-29 DIAGNOSIS — M21611 Bunion of right foot: Secondary | ICD-10-CM | POA: Diagnosis not present

## 2020-07-29 DIAGNOSIS — M792 Neuralgia and neuritis, unspecified: Secondary | ICD-10-CM | POA: Diagnosis not present

## 2020-07-29 DIAGNOSIS — M2042 Other hammer toe(s) (acquired), left foot: Secondary | ICD-10-CM | POA: Diagnosis not present

## 2020-07-29 DIAGNOSIS — M2041 Other hammer toe(s) (acquired), right foot: Secondary | ICD-10-CM | POA: Diagnosis not present

## 2020-08-18 DIAGNOSIS — N1831 Chronic kidney disease, stage 3a: Secondary | ICD-10-CM | POA: Diagnosis not present

## 2020-08-18 DIAGNOSIS — Z23 Encounter for immunization: Secondary | ICD-10-CM | POA: Diagnosis not present

## 2020-08-18 DIAGNOSIS — Z0000 Encounter for general adult medical examination without abnormal findings: Secondary | ICD-10-CM | POA: Diagnosis not present

## 2020-08-18 DIAGNOSIS — I700 Atherosclerosis of aorta: Secondary | ICD-10-CM | POA: Diagnosis not present

## 2020-09-08 DIAGNOSIS — Z9181 History of falling: Secondary | ICD-10-CM | POA: Diagnosis not present

## 2020-09-08 DIAGNOSIS — M150 Primary generalized (osteo)arthritis: Secondary | ICD-10-CM | POA: Diagnosis not present

## 2020-09-08 DIAGNOSIS — N1831 Chronic kidney disease, stage 3a: Secondary | ICD-10-CM | POA: Diagnosis not present

## 2020-09-08 DIAGNOSIS — Z8673 Personal history of transient ischemic attack (TIA), and cerebral infarction without residual deficits: Secondary | ICD-10-CM | POA: Diagnosis not present

## 2020-09-08 DIAGNOSIS — I700 Atherosclerosis of aorta: Secondary | ICD-10-CM | POA: Diagnosis not present

## 2020-09-10 ENCOUNTER — Institutional Professional Consult (permissible substitution): Payer: Medicare Other | Admitting: Pulmonary Disease

## 2020-09-14 DIAGNOSIS — Z8673 Personal history of transient ischemic attack (TIA), and cerebral infarction without residual deficits: Secondary | ICD-10-CM | POA: Diagnosis not present

## 2020-09-14 DIAGNOSIS — M150 Primary generalized (osteo)arthritis: Secondary | ICD-10-CM | POA: Diagnosis not present

## 2020-09-14 DIAGNOSIS — N1831 Chronic kidney disease, stage 3a: Secondary | ICD-10-CM | POA: Diagnosis not present

## 2020-09-14 DIAGNOSIS — I700 Atherosclerosis of aorta: Secondary | ICD-10-CM | POA: Diagnosis not present

## 2020-09-14 DIAGNOSIS — Z9181 History of falling: Secondary | ICD-10-CM | POA: Diagnosis not present

## 2020-09-16 DIAGNOSIS — Z9181 History of falling: Secondary | ICD-10-CM | POA: Diagnosis not present

## 2020-09-16 DIAGNOSIS — N1831 Chronic kidney disease, stage 3a: Secondary | ICD-10-CM | POA: Diagnosis not present

## 2020-09-16 DIAGNOSIS — Z8673 Personal history of transient ischemic attack (TIA), and cerebral infarction without residual deficits: Secondary | ICD-10-CM | POA: Diagnosis not present

## 2020-09-16 DIAGNOSIS — I700 Atherosclerosis of aorta: Secondary | ICD-10-CM | POA: Diagnosis not present

## 2020-09-16 DIAGNOSIS — M150 Primary generalized (osteo)arthritis: Secondary | ICD-10-CM | POA: Diagnosis not present

## 2020-09-17 DIAGNOSIS — M150 Primary generalized (osteo)arthritis: Secondary | ICD-10-CM | POA: Diagnosis not present

## 2020-09-17 DIAGNOSIS — N1831 Chronic kidney disease, stage 3a: Secondary | ICD-10-CM | POA: Diagnosis not present

## 2020-09-28 DIAGNOSIS — I700 Atherosclerosis of aorta: Secondary | ICD-10-CM | POA: Diagnosis not present

## 2020-09-28 DIAGNOSIS — Z8673 Personal history of transient ischemic attack (TIA), and cerebral infarction without residual deficits: Secondary | ICD-10-CM | POA: Diagnosis not present

## 2020-09-28 DIAGNOSIS — M150 Primary generalized (osteo)arthritis: Secondary | ICD-10-CM | POA: Diagnosis not present

## 2020-09-28 DIAGNOSIS — Z9181 History of falling: Secondary | ICD-10-CM | POA: Diagnosis not present

## 2020-09-28 DIAGNOSIS — N1831 Chronic kidney disease, stage 3a: Secondary | ICD-10-CM | POA: Diagnosis not present

## 2020-09-30 DIAGNOSIS — I700 Atherosclerosis of aorta: Secondary | ICD-10-CM | POA: Diagnosis not present

## 2020-09-30 DIAGNOSIS — Z8673 Personal history of transient ischemic attack (TIA), and cerebral infarction without residual deficits: Secondary | ICD-10-CM | POA: Diagnosis not present

## 2020-09-30 DIAGNOSIS — N1831 Chronic kidney disease, stage 3a: Secondary | ICD-10-CM | POA: Diagnosis not present

## 2020-09-30 DIAGNOSIS — M150 Primary generalized (osteo)arthritis: Secondary | ICD-10-CM | POA: Diagnosis not present

## 2020-09-30 DIAGNOSIS — Z9181 History of falling: Secondary | ICD-10-CM | POA: Diagnosis not present

## 2020-10-05 DIAGNOSIS — Z8673 Personal history of transient ischemic attack (TIA), and cerebral infarction without residual deficits: Secondary | ICD-10-CM | POA: Diagnosis not present

## 2020-10-05 DIAGNOSIS — I700 Atherosclerosis of aorta: Secondary | ICD-10-CM | POA: Diagnosis not present

## 2020-10-05 DIAGNOSIS — N1831 Chronic kidney disease, stage 3a: Secondary | ICD-10-CM | POA: Diagnosis not present

## 2020-10-05 DIAGNOSIS — Z9181 History of falling: Secondary | ICD-10-CM | POA: Diagnosis not present

## 2020-10-05 DIAGNOSIS — M150 Primary generalized (osteo)arthritis: Secondary | ICD-10-CM | POA: Diagnosis not present

## 2020-10-07 DIAGNOSIS — Z8673 Personal history of transient ischemic attack (TIA), and cerebral infarction without residual deficits: Secondary | ICD-10-CM | POA: Diagnosis not present

## 2020-10-07 DIAGNOSIS — I700 Atherosclerosis of aorta: Secondary | ICD-10-CM | POA: Diagnosis not present

## 2020-10-07 DIAGNOSIS — Z9181 History of falling: Secondary | ICD-10-CM | POA: Diagnosis not present

## 2020-10-07 DIAGNOSIS — M150 Primary generalized (osteo)arthritis: Secondary | ICD-10-CM | POA: Diagnosis not present

## 2020-10-07 DIAGNOSIS — N1831 Chronic kidney disease, stage 3a: Secondary | ICD-10-CM | POA: Diagnosis not present

## 2020-10-12 DIAGNOSIS — N1831 Chronic kidney disease, stage 3a: Secondary | ICD-10-CM | POA: Diagnosis not present

## 2020-10-12 DIAGNOSIS — I700 Atherosclerosis of aorta: Secondary | ICD-10-CM | POA: Diagnosis not present

## 2020-10-12 DIAGNOSIS — Z9181 History of falling: Secondary | ICD-10-CM | POA: Diagnosis not present

## 2020-10-12 DIAGNOSIS — Z8673 Personal history of transient ischemic attack (TIA), and cerebral infarction without residual deficits: Secondary | ICD-10-CM | POA: Diagnosis not present

## 2020-10-12 DIAGNOSIS — M150 Primary generalized (osteo)arthritis: Secondary | ICD-10-CM | POA: Diagnosis not present

## 2020-10-14 DIAGNOSIS — Z9181 History of falling: Secondary | ICD-10-CM | POA: Diagnosis not present

## 2020-10-14 DIAGNOSIS — M150 Primary generalized (osteo)arthritis: Secondary | ICD-10-CM | POA: Diagnosis not present

## 2020-10-14 DIAGNOSIS — N1831 Chronic kidney disease, stage 3a: Secondary | ICD-10-CM | POA: Diagnosis not present

## 2020-10-14 DIAGNOSIS — Z8673 Personal history of transient ischemic attack (TIA), and cerebral infarction without residual deficits: Secondary | ICD-10-CM | POA: Diagnosis not present

## 2020-10-14 DIAGNOSIS — I700 Atherosclerosis of aorta: Secondary | ICD-10-CM | POA: Diagnosis not present

## 2020-10-15 ENCOUNTER — Ambulatory Visit (INDEPENDENT_AMBULATORY_CARE_PROVIDER_SITE_OTHER): Payer: Medicare Other | Admitting: Pulmonary Disease

## 2020-10-15 ENCOUNTER — Other Ambulatory Visit: Payer: Self-pay

## 2020-10-15 ENCOUNTER — Encounter: Payer: Self-pay | Admitting: Pulmonary Disease

## 2020-10-15 VITALS — BP 114/72 | HR 75 | Temp 98.3°F | Ht 65.0 in | Wt 166.8 lb

## 2020-10-15 DIAGNOSIS — R1310 Dysphagia, unspecified: Secondary | ICD-10-CM | POA: Diagnosis not present

## 2020-10-15 DIAGNOSIS — K219 Gastro-esophageal reflux disease without esophagitis: Secondary | ICD-10-CM

## 2020-10-15 DIAGNOSIS — J479 Bronchiectasis, uncomplicated: Secondary | ICD-10-CM

## 2020-10-15 DIAGNOSIS — J849 Interstitial pulmonary disease, unspecified: Secondary | ICD-10-CM | POA: Diagnosis not present

## 2020-10-15 MED ORDER — BENZONATATE 200 MG PO CAPS: 200.0000 mg | ORAL_CAPSULE | Freq: Three times a day (TID) | ORAL | 1 refills | Status: DC | PRN

## 2020-10-15 MED ORDER — IPRATROPIUM-ALBUTEROL 0.5-2.5 (3) MG/3ML IN SOLN: 3.0000 mL | Freq: Four times a day (QID) | RESPIRATORY_TRACT | 1 refills | Status: DC | PRN

## 2020-10-15 NOTE — Progress Notes (Signed)
Synopsis: Referred in 10/2020 for cough by Dr. Merrilee Seashore, MD  Subjective:   PATIENT ID: Julia Robbins DOB: 1923/03/16, MRN: [id number]   HPI  Chief Complaint  Patient presents with  . Consult    Was seen in the ED in October 2021 for a fall and cough. Has had a cough for at least a year. PCP treated her with Mucinex. Non-productive cough. DOE.    Julia Robbins is a 87 year old woman, never smoker with dementia, GERD, hiatal hernia and GI bleeding suspected secondary to GIST tumor in 2020 who has been referred to pulmonary clinic for chronic dry cough and exertional dyspnea.   She is accompanied by her daughter who provides a majority of the history. The cough has been present over the past year and has progressively worsened. The cough is non-productive and worse at nighttime. She does report some concerns for dysphagia as there has been recent trouble swallowing pills. She does report heart burn symptoms. She does have occasional wheezing. There is increasing shortness of breath when ambulating from her bed to the bathroom.   She had a fall last month and was evaluated in the ER. A CT Chest was performed at this time which was notable for a patulous esophagus, peripheral subpleural reticulation throughout both lungs without definite apical basilar gradient. There is right upper lobe bronchiectasis and areas of bronchial thickening.    Past Medical History:  Diagnosis Date  . Abnormal EKG    hx left bundle branch block dating back to 2012 on old ekg's  . Arthritis    degenerative.   . Cholelithiasis 2010   15 mm CBD on CT scan 2010.   Marland Kitchen Colon cancer ([place]) 1998   chemo and colectomy.  T3 N1 stage 3, 2/15 nodes +.   . DVT (deep venous thrombosis) ([facility])    right leg  . Elevated cholesterol   . Family history of adverse reaction to anesthesia    daughter has post op n/v  . Gastric ulcer 1998  . [place] filter in place 1998   for DVT. right leg  . HOH  (hard of hearing)    slight     Family History  Problem Relation Age of Onset  . Heart disease Mother   . Colon cancer Neg Hx   . Stomach cancer Neg Hx   . Esophageal cancer Neg Hx   . Pancreatic cancer Neg Hx   . Liver disease Neg Hx      Social History   Socioeconomic History  . Marital status: Widowed    Spouse name: Not on file  . Number of children: Not on file  . Years of education: Not on file  . Highest education level: Not on file  Occupational History  . Not on file  Tobacco Use  . Smoking status: Never Smoker  . Smokeless tobacco: Never Used  Vaping Use  . Vaping Use: Never used  Substance and Sexual Activity  . Alcohol use: No  . Drug use: No  . Sexual activity: Never  Other Topics Concern  . Not on file  Social History Narrative  . Not on file   Social Determinants of Health   Financial Resource Strain: Not on file  Food Insecurity: Not on file  Transportation Needs: Not on file  Physical Activity: Not on file  Stress: Not on file  Social Connections: Not on file  Intimate Partner Violence: Not on file     No  Known Allergies   Outpatient Medications Prior to Visit  Medication Sig Dispense Refill  . acetaminophen (TYLENOL) 500 MG tablet Take 1,000 mg by mouth 2 (two) times daily as needed for moderate pain or headache.    . Cholecalciferol (VITAMIN D-3) 1000 units CAPS Take 1 tablet by mouth in the morning and at bedtime.     Marland Kitchen Dextromethorphan-guaiFENesin (MUCUS-DM) 30-600 MG TB12 Take 1 tablet by mouth in the morning and at bedtime.    . ferrous sulfate 325 (65 FE) MG tablet Take 325 mg by mouth 2 (two) times daily with a meal.     . furosemide (LASIX) 40 MG tablet Take 80 mg by mouth daily.    . nortriptyline (PAMELOR) 10 MG capsule Take 10 mg by mouth 2 (two) times daily.    . pantoprazole (PROTONIX) 40 MG tablet Take 1 tablet (40 mg total) by mouth 2 (two) times daily before a meal. 60 tablet 3  . potassium chloride SA (KLOR-CON) 20 MEQ  tablet Take 20 mEq by mouth 2 (two) times daily.    . vitamin B-12 (CYANOCOBALAMIN) 1000 MCG tablet Take 1,000 mcg by mouth daily.     No facility-administered medications prior to visit.    Review of Systems  Constitutional: Negative for chills, fever, malaise/fatigue and weight loss.  HENT: Negative for sinus pain and sore throat.   Eyes: Negative.   Respiratory: Positive for cough, shortness of breath and wheezing. Negative for hemoptysis and sputum production.   Cardiovascular: Positive for leg swelling. Negative for chest pain, palpitations, orthopnea, claudication and PND.  Gastrointestinal: Positive for heartburn. Negative for abdominal pain, nausea and vomiting.  Genitourinary: Negative.   Musculoskeletal: Positive for falls.  Skin: Negative for rash.  Neurological: Positive for weakness.  Endo/Heme/Allergies: Negative.   Psychiatric/Behavioral: Positive for memory loss.   Objective:   Vitals:   10/15/20 1606  BP: 114/72  Pulse: 75  Temp: 98.3 F (36.8 C)  TempSrc: Temporal  SpO2: 100%  Weight: 166 lb 12.8 oz (75.7 kg)  Height: 5\' 5"  (1.651 m)     Physical Exam Constitutional:      General: She is not in acute distress.    Appearance: She is normal weight. She is not ill-appearing.  HENT:     Head: Normocephalic and atraumatic.  Eyes:     General: No scleral icterus.    Conjunctiva/sclera: Conjunctivae normal.     Pupils: Pupils are equal, round, and reactive to light.  Cardiovascular:     Rate and Rhythm: Normal rate and regular rhythm.     Pulses: Normal pulses.     Heart sounds: Normal heart sounds. No murmur heard.   Pulmonary:     Effort: Pulmonary effort is normal.     Breath sounds: Rales (bilateral bases) present.     Comments: Scattered end inspiratory chirps of bilateral mid and lower lung fields Abdominal:     General: Bowel sounds are normal.     Palpations: Abdomen is soft.  Musculoskeletal:     Right lower leg: No edema.     Left lower  leg: No edema.  Lymphadenopathy:     Cervical: No cervical adenopathy.  Skin:    General: Skin is warm and dry.  Neurological:     General: No focal deficit present.     Mental Status: She is alert.  Psychiatric:        Mood and Affect: Mood normal.        Behavior: Behavior normal.  Thought Content: Thought content normal.        Judgment: Judgment normal.    CBC    Component Value Date/Time   WBC 6.9 07/27/2020 2313   RBC 4.11 07/27/2020 2313   HGB 12.9 07/27/2020 2313   HGB 13.8 04/15/2015 1012   HCT 39.3 07/27/2020 2313   HCT 40.9 04/15/2015 1012   PLT 211 07/27/2020 2313   PLT 186 04/15/2015 1012   MCV 95.6 07/27/2020 2313   MCV 90.1 04/15/2015 1012   MCH 31.4 07/27/2020 2313   MCHC 32.8 07/27/2020 2313   RDW 12.9 07/27/2020 2313   RDW 12.7 04/15/2015 1012   LYMPHSABS 1.8 09/06/2019 0918   LYMPHSABS 1.9 04/15/2015 1012   MONOABS 0.6 09/06/2019 0918   MONOABS 0.6 04/15/2015 1012   EOSABS 0.5 09/06/2019 0918   EOSABS 0.4 04/15/2015 1012   BASOSABS 0.1 09/06/2019 0918   BASOSABS 0.0 04/15/2015 1012     Chest imaging: CT Chest 07/28/20 1. No evidence of acute traumatic injury to the chest, abdomen, or pelvis allowing for lack of IV contrast. No rib fracture. 2. Peripheral subpleural reticulation throughout both lungs without definite apical basilar gradient. Right upper lobe bronchiectasis and areas of bronchial thickening.  3. Chronic findings include cardiomegaly, small pericardial effusion, and colonic diverticulosis.  PFT: No flowsheet data found.  Echo: 09/06/2019 1. Left ventricular ejection fraction, by visual estimation, is 40 to  45%. The left ventricle has mild to moderately decreased function. There  is mildly increased left ventricular hypertrophy.  2. Left ventricular diastolic parameters are consistent with Grade I  diastolic dysfunction (impaired relaxation).  3. Global right ventricle has normal systolic function.The right   ventricular size is normal. No increase in right ventricular wall  thickness.  4. Left atrial size was normal.  5. Right atrial size was normal.  6. The mitral valve is normal in structure. Mild mitral valve  regurgitation. No evidence of mitral stenosis.  7. The tricuspid valve is normal in structure. Tricuspid valve  regurgitation is not demonstrated.  8. Aortic valve mean gradient measures 23.0 mmHg.  9. Aortic valve regurgitation is moderate.  10. The aortic valve is normal in structure. Aortic valve regurgitation is  moderate. Moderate aortic valve stenosis.  11. The pulmonic valve was normal in structure. Pulmonic valve  regurgitation is not visualized.  12. Mildly elevated pulmonary artery systolic pressure.  13. The inferior vena cava is normal in size with greater than 50%  respiratory variability, suggesting right atrial pressure of 3 mmHg.  14. Moderate aortic stenosis.  15. There is mild global hypokinesis and dyskinesis of the  interventricular septum, LVE 40-45%.   Assessment & Plan:   Bronchiectasis without complication ([place]) - Plan: Ambulatory Referral for DME  Interstitial lung disease ([place])  Gastroesophageal reflux disease without esophagitis  Dysphagia, unspecified type  Discussion: Julia Robbins is a 87 year old woman, never smoker with dementia, GERD, hiatal hernia and GI bleeding suspected secondary to GIST tumor in 2020 who has been referred to pulmonary clinic for chronic dry cough and exertional dyspnea.   Her cough is most likely related to GERD in setting of hiatal hernia and patulous esophagus noted on EGD and chest imaging. She also has RUL bronchiectasis and diffuse subpleural reticulation concerning for interstitial lung disease. The bronchiectasis and parenchymal reticulation could be secondary to chronic reflux/aspiration. There is concern for dysphagia as she recently has had trouble swallowing her pills. We will consider referral to speech  therapy in the future  if needed.   She is already on pantoprazole for GERD. She has been instructed to elevated the head of her bed about 4-6 inches with blocks to reduce nocturnal reflux events. We can consider adding on famotidine therapy in the future if needed.   For the bronchiectasis, we will start her on duoneb nebulizer treatments 2-3 times per day follow by flutter valve therapy.   We will trial her on tessalon perles for symptomatic cough relief.    Follow up in 2 months.  Freda Jackson, MD [facility] Office: [phone]   See Amion for Pager Details    Current Outpatient Medications:  .  acetaminophen (TYLENOL) 500 MG tablet, Take 1,000 mg by mouth 2 (two) times daily as needed for moderate pain or headache., Disp: , Rfl:  .  benzonatate (TESSALON) 200 MG capsule, Take 1 capsule (200 mg total) by mouth 3 (three) times daily as needed for cough., Disp: 30 capsule, Rfl: 1 .  Cholecalciferol (VITAMIN D-3) 1000 units CAPS, Take 1 tablet by mouth in the morning and at bedtime. , Disp: , Rfl:  .  Dextromethorphan-guaiFENesin (MUCUS-DM) 30-600 MG TB12, Take 1 tablet by mouth in the morning and at bedtime., Disp: , Rfl:  .  ferrous sulfate 325 (65 FE) MG tablet, Take 325 mg by mouth 2 (two) times daily with a meal. , Disp: , Rfl:  .  furosemide (LASIX) 40 MG tablet, Take 80 mg by mouth daily., Disp: , Rfl:  .  ipratropium-albuterol (DUONEB) 0.5-2.5 (3) MG/3ML SOLN, Take 3 mLs by nebulization every 6 (six) hours as needed., Disp: 360 mL, Rfl: 1 .  nortriptyline (PAMELOR) 10 MG capsule, Take 10 mg by mouth 2 (two) times daily., Disp: , Rfl:  .  pantoprazole (PROTONIX) 40 MG tablet, Take 1 tablet (40 mg total) by mouth 2 (two) times daily before a meal., Disp: 60 tablet, Rfl: 3 .  potassium chloride SA (KLOR-CON) 20 MEQ tablet, Take 20 mEq by mouth 2 (two) times daily., Disp: , Rfl:  .  vitamin B-12 (CYANOCOBALAMIN) 1000 MCG tablet, Take 1,000 mcg by mouth  daily., Disp: , Rfl:

## 2020-10-15 NOTE — Patient Instructions (Addendum)
Elevate the head of your bed 4-6 inches with blocks to prevent night time reflux  Start using duoneb nebulizer solution 2-3 times per day. Use flutter valve after each nebulizer treatment to aid in mucous clearance  If trouble with swallowing worsens, we can send a referral to speech therapy to assess her swallowing ability  Try Benzonatate (tessalon) 1 tab, 3 times daily as needed for cough

## 2020-10-28 DIAGNOSIS — I700 Atherosclerosis of aorta: Secondary | ICD-10-CM | POA: Diagnosis not present

## 2020-10-28 DIAGNOSIS — Z9181 History of falling: Secondary | ICD-10-CM | POA: Diagnosis not present

## 2020-10-28 DIAGNOSIS — Z8673 Personal history of transient ischemic attack (TIA), and cerebral infarction without residual deficits: Secondary | ICD-10-CM | POA: Diagnosis not present

## 2020-10-28 DIAGNOSIS — M150 Primary generalized (osteo)arthritis: Secondary | ICD-10-CM | POA: Diagnosis not present

## 2020-10-28 DIAGNOSIS — N1831 Chronic kidney disease, stage 3a: Secondary | ICD-10-CM | POA: Diagnosis not present

## 2020-11-04 DIAGNOSIS — Z8673 Personal history of transient ischemic attack (TIA), and cerebral infarction without residual deficits: Secondary | ICD-10-CM | POA: Diagnosis not present

## 2020-11-04 DIAGNOSIS — M150 Primary generalized (osteo)arthritis: Secondary | ICD-10-CM | POA: Diagnosis not present

## 2020-11-04 DIAGNOSIS — N1831 Chronic kidney disease, stage 3a: Secondary | ICD-10-CM | POA: Diagnosis not present

## 2020-11-04 DIAGNOSIS — Z9181 History of falling: Secondary | ICD-10-CM | POA: Diagnosis not present

## 2020-11-04 DIAGNOSIS — I700 Atherosclerosis of aorta: Secondary | ICD-10-CM | POA: Diagnosis not present

## 2020-11-23 ENCOUNTER — Telehealth: Payer: Self-pay | Admitting: Pulmonary Disease

## 2020-11-24 NOTE — Telephone Encounter (Addendum)
Order was sent to [employer] on 1/18.  I called Julia Robbins & she states no shortage she is aware of.  She checked notes and they spoke to dtr on 1/18 & dtr was supposed to call back with card info & a shipping address.  They have not heard back from her.  Called Julia Robbins & she states they told her they were on backorder & they would call her.  I gave her customer service phone # & she is going to call them.  Nothing further needed.

## 2020-11-25 DIAGNOSIS — J42 Unspecified chronic bronchitis: Secondary | ICD-10-CM | POA: Diagnosis not present

## 2020-12-23 ENCOUNTER — Other Ambulatory Visit: Payer: Self-pay | Admitting: *Deleted

## 2020-12-23 NOTE — Patient Outreach (Signed)
[facility]) Care Management  12/23/2020  Julia Robbins 1922-11-12 [id number]   Telephone Screen  Referral Date : 12/21/20 Referral Source: Insurance Plan Referral Reason:Assess for complex case management needs  Insurance:[employer]    Outreach Attempt #1 Subjective: Unsuccessful outreach call to patient/daughter [employer], designated party release  contact number  listed, no answer able to leave a HIPAA compliant voicemail message for return call.     Plan RN Care Coordinator will send patient unsuccessful outreach letter, and plan return call in the next 4 business days.    Joylene Draft, RN, BSN  [facility] Management Coordinator  [phone]- Mobile [phone]- Toll Free Main Office

## 2020-12-28 ENCOUNTER — Other Ambulatory Visit: Payer: Self-pay | Admitting: *Deleted

## 2020-12-28 NOTE — Patient Outreach (Signed)
[facility]) Care Management  12/28/2020  SHELISHA GAUTIER [birth year] [id number]    Telephone Screen  Referral Date : 12/21/20 Referral Source: Insurance Plan Referral Reason:Assess for complex case management needs  Insurance:[employer]    Outreach Attempt #2 Subjective: Unsuccessful outreach call to patient/daughter [employer], designated party release  contact number  listed, no answer able to leave a HIPAA compliant voicemail message for return call.     Plan RN Care Coordinator will send patient unsuccessful outreach letter, and plan return call in the next 4 business days.   Joylene Draft, RN, BSN  [facility] Management Coordinator  [phone]- Mobile [phone]- Toll Free Main Office

## 2020-12-31 ENCOUNTER — Other Ambulatory Visit: Payer: Self-pay | Admitting: *Deleted

## 2020-12-31 DIAGNOSIS — F0281 Dementia in other diseases classified elsewhere with behavioral disturbance: Secondary | ICD-10-CM

## 2020-12-31 DIAGNOSIS — G309 Alzheimer's disease, unspecified: Secondary | ICD-10-CM

## 2020-12-31 NOTE — Patient Instructions (Signed)
Critical care medicine: Principles of diagnosis and management in the adult (4th ed., pp. 0350-0938). Saunders."> Miller's anesthesia (8th ed., pp. 232-250). Saunders.">  Advance Directive  Advance directives are legal documents that allow you to make decisions about your health care and medical treatment in case you become unable to communicate for yourself. Advance directives let your wishes be known to family, friends, and health care providers. Discussing and writing advance directives should happen over time rather than all at once. Advance directives can be changed and updated at any time. There are different types of advance directives, such as:  Medical power of attorney.  Living will.  Do not resuscitate (DNR) order or do not attempt resuscitation (DNAR) order. Health care proxy and medical power of attorney A health care proxy is also called a health care agent. This person is appointed to make medical decisions for you when you are unable to make decisions for yourself. Generally, people ask a trusted friend or family member to act as their proxy and represent their preferences. Make sure you have an agreement with your trusted person to act as your proxy. A proxy may have to make a medical decision on your behalf if your wishes are not known. A medical power of attorney, also called a durable power of attorney for health care, is a legal document that names your health care proxy. Depending on the laws in your state, the document may need to be:  Signed.  Notarized.  Dated.  Copied.  Witnessed.  Incorporated into your medical record. You may also want to appoint a trusted person to manage your money in the event you are unable to do so. This is called a durable power of attorney for finances. It is a separate legal document from the durable power of attorney for health care. You may choose your health care proxy or someone different to act as your agent in money matters. If you  do not appoint a proxy, or there is a concern that the proxy is not acting in your best interest, a court may appoint a guardian to act on your behalf. Living will A living will is a set of instructions that state your wishes about medical care when you cannot express them yourself. Health care providers should keep a copy of your living will in your medical record. You may want to give a copy to family members or friends. To alert caregivers in case of an emergency, you can place a card in your wallet to let them know that you have a living will and where they can find it. A living will is used if you become:  Terminally ill.  Disabled.  Unable to communicate or make decisions. The following decisions should be included in your living will:  To use or not to use life support equipment, such as dialysis machines and breathing machines (ventilators).  Whether you want a DNR or DNAR order. This tells health care providers not to use cardiopulmonary resuscitation (CPR) if breathing or heartbeat stops.  To use or not to use tube feeding.  To be given or not to be given food and fluids.  Whether you want comfort (palliative) care when the goal becomes comfort rather than a cure.  Whether you want to donate your organs and tissues. A living will does not give instructions for distributing your money and property if you should pass away. DNR or DNAR A DNR or DNAR order is a request not to have CPR in  the event that your heart stops beating or you stop breathing. If a DNR or DNAR order has not been made and shared, a health care provider will try to help any patient whose heart has stopped or who has stopped breathing. If you plan to have surgery, talk with your health care provider about how your DNR or DNAR order will be followed if problems occur. What if I do not have an advance directive? Some states assign family decision makers to act on your behalf if you do not have an advance directive.  Each state has its own laws about advance directives. You may want to check with your health care provider, attorney, or state representative about the laws in your state. Summary  Advance directives are legal documents that allow you to make decisions about your health care and medical treatment in case you become unable to communicate for yourself.  The process of discussing and writing advance directives should happen over time. You can change and update advance directives at any time.  Advance directives may include a medical power of attorney, a living will, and a DNR or DNAR order. This information is not intended to replace advice given to you by your health care provider. Make sure you discuss any questions you have with your health care provider. Document Revised: 06/22/2020 Document Reviewed: 06/22/2020 Elsevier Patient Education  [place]. https://[email or web address]">  Dementia Caregiver Guide Dementia is a term used to describe a number of symptoms that affect memory and thinking. The most common symptoms include:  Memory loss.  Trouble with language and communication.  Trouble concentrating.  Poor judgment and problems with reasoning.  Wandering from home or public places.  Extreme anxiety or depression.  Being suspicious or having angry outbursts and accusations.  Child-like behavior and language. Dementia can be frightening and confusing. And taking care of someone with dementia can be challenging. This guide provides tips to help you when providing care for a person with dementia. How to help manage lifestyle changes Dementia usually gets worse slowly over time. In the early stages, people with dementia can stay independent and safe with some help. In later stages, they need help with daily tasks such as dressing, grooming, and using the bathroom. There are actions you can take to help a person manage his or her life while  living with this condition. Communicating  When the person is talking or seems frustrated, make eye contact and hold the person's hand.  Ask specific questions that need yes or no answers.  Use simple words, short sentences, and a calm voice. Only give one direction at a time.  When offering choices, limit the person to just one or two.  Avoid correcting the person in a negative way.  If the person is struggling to find the right words, gently try to help him or her. Preventing injury  Keep floors clear of clutter. Remove rugs, magazine racks, and floor lamps.  Keep hallways well lit, especially at night.  Put a handrail and nonslip mat in the bathtub or shower.  Put childproof locks on cabinets that contain dangerous items, such as medicines, alcohol, guns, toxic cleaning items, sharp tools or utensils, matches, and lighters.  For doors to the outside of the house, put the locks in places where the person cannot see or reach them easily. This will help ensure that the person does not wander out of the house and get lost.  Be prepared for emergencies. Keep a list of emergency  phone numbers and addresses in a convenient area.  Remove car keys and lock garage doors so that the person does not try to get in the car and drive.  Have the person wear a bracelet that tracks locations and identifies the person as having memory problems. This should be worn at all times for safety.   Helping with daily life  Keep the person on track with his or her routine.  Try to identify areas where the person may need help.  Be supportive, patient, calm, and encouraging.  Gently remind the person that adjusting to changes takes time.  Help with the tasks that the person has asked for help with.  Keep the person involved in daily tasks and decisions as much as possible.  Encourage conversation, but try not to get frustrated if the person struggles to find words or does not seem to appreciate  your help.   How to recognize stress Look for signs of stress in yourself and in the person you are caring for. If you notice signs of stress, take steps to manage it. Symptoms of stress include:  Feeling anxious, irritable, frustrated, or angry.  Denying that the person has dementia or that his or her symptoms will not improve.  Feeling depressed, hopeless, or unappreciated.  Difficulty sleeping.  Difficulty concentrating.  Developing stress-related health problems.  Feeling like you have too little time for your own life. Follow these instructions at home: Take care of your health Make sure that you and the person you are caring for:  Get regular sleep.  Exercise regularly.  Eat regular, nutritious meals.  Take over-the-counter and prescription medicines only as told by your health care providers.  Drink enough fluid to keep your urine pale yellow.  Attend all scheduled health care appointments.   General instructions  Join a support group with others who are caregivers.  Ask about respite care resources. Respite care can provide short-term care for the person so that you can have a regular break from the stress of caregiving.  Consider any safety risks and take steps to avoid them.  Organize medicines in a pill box for each day of the week.  Create a plan to handle any legal or financial matters. Get legal or financial advice if needed.  Keep a calendar in a central location to remind the person of appointments or other activities. Where to find support: Many individuals and organizations offer support. These include:  Support groups for people with dementia.  Support groups for caregivers.  Counselors or therapists.  Home health care services.  Adult day care centers. Where to find more information  Centers for Disease Control and Prevention: http://www.wolf.info/  Alzheimer's Association: [employer].co.nz  Family Caregiver Alliance:  www.caregiver.[place]: www.alzfdn.org Contact a health care provider if:  The person's health is rapidly getting worse.  You are no longer able to care for the person.  Caring for the person is affecting your physical and emotional health.  You are feeling depressed or anxious about caring for the person. Get help right away if:  The person threatens himself or herself, you, or anyone else.  You feel depressed or sad, or feel that you want to harm yourself. If you ever feel like your loved one may hurt himself or herself or others, or if he or she shares thoughts about taking his or her own life, get help right away. You can go to your nearest emergency department or:  Call your local  emergency services (911 in the [place]  Call a suicide crisis helpline, such as the [place] at 262-667-3021. This is open 24 hours a day in the [place]  Text the Crisis Text Line at (763)301-3860 (in the [place].). Summary  Dementia is a term used to describe a number of symptoms that affect memory and thinking.  Dementia usually gets worse slowly over time.  Take steps to reduce the person's risk of injury and to plan for future care.  Caregivers need support, relief from caregiving, and time for their own lives. This information is not intended to replace advice given to you by your health care provider. Make sure you discuss any questions you have with your health care provider. Document Revised: 02/02/2020 Document Reviewed: 02/02/2020 Elsevier Patient Education  [place].

## 2020-12-31 NOTE — Patient Outreach (Signed)
[facility]) Care Management  12/31/2020  LEEZA HEINER [birth year] [id number]   Telephone Screen  Referral Date :12/21/20 Referral Source:Insurance Plan Referral Reason:Assess for complex case management needs Insurance:[employer]   Outreach Attempt #3 Subjective: Unsuccessful outreach call to patient/daughter Kathe Becton, designated party release contact number listed, no answer able to leave a HIPAA compliant voicemail message for return call.    Plan Will schedule return call in the next 4 weeks per workflow.    Joylene Draft, RN, BSN  [place] Management Coordinator  [phone]- Mobile [phone]- Toll Free Main Office

## 2020-12-31 NOTE — Patient Outreach (Signed)
[facility]) Care Management  12/31/2020  Julia Robbins [birth year] [id number]   Telephone Screen  Referral Date :12/21/20 Referral Source:Insurance Plan Referral Reason:Assess for complex case management needs Insurance:[employer]   Outreach Attempt #3 Subjective: Return call from Kathe Becton, daughter of patient and listed as designated party release on record. Explained the reason for the call, [facility] care management services.  Patient daughter discussed that patient has Dementia and family she has arranged 24 hours family support to provide care.  Julia Robbins  states that she is in the process of arranging for patient to go to program at [employer] care for a few hours 3 days a week. She reports working with provider at office to get necessary paper work needed. Patient daughter request information on advanced directives. She discussed patient does have memory issues but is alert and oriented and "know what is going on around her" Julia Robbins states patient asked about receiving meals on wheels, and daughter is  interested in receiving information on Dementia care and support.  Julia Robbins is only able to have brief conversation at this time ,  Explained and offered [facility] care management services, daughter is agreeable to services, she is unable to complete further assessment at this time. Brief call at this time as she is just getting off work and needs to check on her mother.She is agreeable to return call for follow up assessment and goal setting.   Plan Patient daughter request to return call to me on next business day as to when to schedule next phone call as they are going out of town next week. Will send patient/daughter education material on Dementia care   Will place referral for Care Guide regarding meals on wheels request, Dementia support resources. Will send Advanced Directives packet and education handout   Julia Draft, RN, BSN  [place]  Management Coordinator  [phone]- Mobile [phone]- [place]

## 2021-01-06 ENCOUNTER — Telehealth: Payer: Self-pay | Admitting: *Deleted

## 2021-01-06 NOTE — Telephone Encounter (Signed)
   Telephone encounter was:  Unsuccessful.  01/06/2021 Name: Julia Robbins MRN: [id number] DOB: [birth year]  Unsuccessful outbound call made today to assist with:  Food Insecurity and Caregiver Stress  Outreach Attempt:  1st Attempt  A HIPAA compliant voice message was left requesting a return call.  Instructed patient to call back at [place], Care Management  [phone] [address] , [place] [address] Email : [email or web address]

## 2021-01-07 ENCOUNTER — Emergency Department (HOSPITAL_COMMUNITY): Payer: Medicare Other

## 2021-01-07 ENCOUNTER — Inpatient Hospital Stay (HOSPITAL_COMMUNITY)
Admission: EM | Admit: 2021-01-07 | Discharge: 2021-01-10 | DRG: 683 | Disposition: A | Discharge status: Home Health | Payer: Medicare Other | Attending: Internal Medicine | Admitting: Internal Medicine

## 2021-01-07 ENCOUNTER — Other Ambulatory Visit: Payer: Self-pay

## 2021-01-07 DIAGNOSIS — N179 Acute kidney failure, unspecified: Principal | ICD-10-CM | POA: Diagnosis present

## 2021-01-07 DIAGNOSIS — K5750 Diverticulosis of both small and large intestine without perforation or abscess without bleeding: Secondary | ICD-10-CM | POA: Diagnosis not present

## 2021-01-07 DIAGNOSIS — Z20822 Contact with and (suspected) exposure to covid-19: Secondary | ICD-10-CM | POA: Diagnosis not present

## 2021-01-07 DIAGNOSIS — I130 Hypertensive heart and chronic kidney disease with heart failure and stage 1 through stage 4 chronic kidney disease, or unspecified chronic kidney disease: Secondary | ICD-10-CM | POA: Diagnosis not present

## 2021-01-07 DIAGNOSIS — I959 Hypotension, unspecified: Secondary | ICD-10-CM | POA: Diagnosis not present

## 2021-01-07 DIAGNOSIS — K3189 Other diseases of stomach and duodenum: Secondary | ICD-10-CM | POA: Diagnosis not present

## 2021-01-07 DIAGNOSIS — Z86718 Personal history of other venous thrombosis and embolism: Secondary | ICD-10-CM

## 2021-01-07 DIAGNOSIS — E861 Hypovolemia: Secondary | ICD-10-CM | POA: Diagnosis not present

## 2021-01-07 DIAGNOSIS — Z95828 Presence of other vascular implants and grafts: Secondary | ICD-10-CM | POA: Diagnosis not present

## 2021-01-07 DIAGNOSIS — I9589 Other hypotension: Secondary | ICD-10-CM | POA: Diagnosis not present

## 2021-01-07 DIAGNOSIS — E860 Dehydration: Secondary | ICD-10-CM | POA: Diagnosis present

## 2021-01-07 DIAGNOSIS — I5040 Unspecified combined systolic (congestive) and diastolic (congestive) heart failure: Secondary | ICD-10-CM

## 2021-01-07 DIAGNOSIS — Z85038 Personal history of other malignant neoplasm of large intestine: Secondary | ICD-10-CM | POA: Diagnosis not present

## 2021-01-07 DIAGNOSIS — A084 Viral intestinal infection, unspecified: Secondary | ICD-10-CM | POA: Diagnosis not present

## 2021-01-07 DIAGNOSIS — Z79899 Other long term (current) drug therapy: Secondary | ICD-10-CM

## 2021-01-07 DIAGNOSIS — R197 Diarrhea, unspecified: Secondary | ICD-10-CM | POA: Diagnosis not present

## 2021-01-07 DIAGNOSIS — I5042 Chronic combined systolic (congestive) and diastolic (congestive) heart failure: Secondary | ICD-10-CM | POA: Diagnosis not present

## 2021-01-07 DIAGNOSIS — Z9049 Acquired absence of other specified parts of digestive tract: Secondary | ICD-10-CM | POA: Diagnosis not present

## 2021-01-07 DIAGNOSIS — R000 Tachycardia, unspecified: Secondary | ICD-10-CM | POA: Diagnosis not present

## 2021-01-07 DIAGNOSIS — I499 Cardiac arrhythmia, unspecified: Secondary | ICD-10-CM | POA: Diagnosis not present

## 2021-01-07 DIAGNOSIS — K8020 Calculus of gallbladder without cholecystitis without obstruction: Secondary | ICD-10-CM | POA: Diagnosis not present

## 2021-01-07 DIAGNOSIS — F0390 Unspecified dementia without behavioral disturbance: Secondary | ICD-10-CM | POA: Diagnosis present

## 2021-01-07 DIAGNOSIS — Z85831 Personal history of malignant neoplasm of soft tissue: Secondary | ICD-10-CM | POA: Diagnosis not present

## 2021-01-07 DIAGNOSIS — N1832 Chronic kidney disease, stage 3b: Secondary | ICD-10-CM | POA: Diagnosis present

## 2021-01-07 DIAGNOSIS — Z743 Need for continuous supervision: Secondary | ICD-10-CM | POA: Diagnosis not present

## 2021-01-07 LAB — COMPREHENSIVE METABOLIC PANEL
ALT: 15 U/L (ref 0–44)
AST: 20 U/L (ref 15–41)
Albumin: 3.8 g/dL (ref 3.5–5.0)
Alkaline Phosphatase: 64 U/L (ref 38–126)
Anion gap: 13 (ref 5–15)
BUN: 69 mg/dL — ABNORMAL HIGH (ref 8–23)
CO2: 18 mmol/L — ABNORMAL LOW (ref 22–32)
Calcium: 9.2 mg/dL (ref 8.9–10.3)
Chloride: 101 mmol/L (ref 98–111)
Creatinine, Ser: 3.23 mg/dL — ABNORMAL HIGH (ref 0.44–1.00)
GFR, Estimated: 13 mL/min — ABNORMAL LOW (ref >60)
Glucose, Bld: 90 mg/dL (ref 70–99)
Potassium: 4.1 mmol/L (ref 3.5–5.1)
Sodium: 132 mmol/L — ABNORMAL LOW (ref 135–145)
Total Bilirubin: 1 mg/dL (ref 0.3–1.2)
Total Protein: 8.6 g/dL — ABNORMAL HIGH (ref 6.5–8.1)

## 2021-01-07 LAB — CBC WITH DIFFERENTIAL/PLATELET
Abs Immature Granulocytes: 0.02 10*3/uL (ref 0.00–0.07)
Basophils Absolute: 0 10*3/uL (ref 0.0–0.1)
Basophils Relative: 0 %
Eosinophils Absolute: 0.1 10*3/uL (ref 0.0–0.5)
Eosinophils Relative: 1 %
HCT: 47.5 % — ABNORMAL HIGH (ref 36.0–46.0)
Hemoglobin: 15.9 g/dL — ABNORMAL HIGH (ref 12.0–15.0)
Immature Granulocytes: 0 %
Lymphocytes Relative: 20 %
Lymphs Abs: 1.3 10*3/uL (ref 0.7–4.0)
MCH: 30.9 pg (ref 26.0–34.0)
MCHC: 33.5 g/dL (ref 30.0–36.0)
MCV: 92.4 fL (ref 80.0–100.0)
Monocytes Absolute: 0.7 10*3/uL (ref 0.1–1.0)
Monocytes Relative: 12 %
Neutro Abs: 4.2 10*3/uL (ref 1.7–7.7)
Neutrophils Relative %: 67 %
Platelets: 247 10*3/uL (ref 150–400)
RBC: 5.14 MIL/uL — ABNORMAL HIGH (ref 3.87–5.11)
RDW: 12.6 % (ref 11.5–15.5)
WBC: 6.3 10*3/uL (ref 4.0–10.5)
nRBC: 0 % (ref 0.0–0.2)

## 2021-01-07 LAB — MAGNESIUM: Magnesium: 2.4 mg/dL (ref 1.7–2.4)

## 2021-01-07 LAB — RESP PANEL BY RT-PCR (FLU A&B, COVID) ARPGX2
Influenza A by PCR: NEGATIVE
Influenza B by PCR: NEGATIVE
SARS Coronavirus 2 by RT PCR: NEGATIVE

## 2021-01-07 MED ORDER — FERROUS SULFATE 325 (65 FE) MG PO TABS
325.0000 mg | ORAL_TABLET | Freq: Two times a day (BID) | ORAL | Status: DC
  Administered 2021-01-08 – 2021-01-10 (×5): 325 mg via ORAL
  Filled 2021-01-07 (×5): qty 1

## 2021-01-07 MED ORDER — POTASSIUM CHLORIDE CRYS ER 20 MEQ PO TBCR
20.0000 meq | EXTENDED_RELEASE_TABLET | Freq: Two times a day (BID) | ORAL | Status: DC
  Administered 2021-01-08 – 2021-01-10 (×6): 20 meq via ORAL
  Filled 2021-01-07 (×6): qty 1

## 2021-01-07 MED ORDER — NORTRIPTYLINE HCL 10 MG PO CAPS
10.0000 mg | ORAL_CAPSULE | Freq: Two times a day (BID) | ORAL | Status: DC
  Administered 2021-01-08 – 2021-01-10 (×6): 10 mg via ORAL
  Filled 2021-01-07 (×7): qty 1

## 2021-01-07 MED ORDER — SODIUM CHLORIDE 0.9 % IV BOLUS
500.0000 mL | Freq: Once | INTRAVENOUS | Status: AC
  Administered 2021-01-07: 500 mL via INTRAVENOUS

## 2021-01-07 MED ORDER — LACTATED RINGERS IV SOLN: INTRAVENOUS | Status: AC

## 2021-01-07 NOTE — ED Provider Notes (Signed)
[facility] DEPT Provider Note   CSN: [id number] Arrival date & time: 01/07/21  1805     History Chief Complaint  Patient presents with  . Diarrhea    Julia Robbins is a 87 y.o. female.  HPI Patient presents with left lateral abdominal pain, anorexia, diarrhea.  Onset was maybe 1 week ago, and she notes that she is actually slightly better today in terms of pain control.  However, during this illness she has had minimal p.o. intake, and was sore, persistent, mild, pain in the left lateral abdomen she was sent here for evaluation. Is unclear if she is taking any medication for pain control, or if there are any clear alleviating or exacerbating factors.    Past Medical History:  Diagnosis Date  . Abnormal EKG    hx left bundle branch block dating back to 2012 on old ekg's  . Arthritis    degenerative.   . Cholelithiasis 2010   15 mm CBD on CT scan 2010.   Marland Kitchen Colon cancer ([place]) 1998   chemo and colectomy.  T3 N1 stage 3, 2/15 nodes +.   . DVT (deep venous thrombosis) (HCC)    right leg  . Elevated cholesterol   . Family history of adverse reaction to anesthesia    daughter has post op n/v  . Gastric ulcer 1998  . Greenfield filter in place 1998   for DVT. right leg  . HOH (hard of hearing)    slight    Patient Active Problem List   Diagnosis Date Noted  . Symptomatic anemia   . Acute combined systolic and diastolic heart failure ([place])   . Moderate aortic stenosis   . Moderate aortic regurgitation   . Acute lower GI bleeding 09/04/2019  . Hematemesis 09/04/2019  . Cholelithiasis 09/04/2019  . Abnormal abdominal CT scan   . RLQ abdominal pain   . Acute blood loss anemia 12/02/2015  . CKD (chronic kidney disease) stage 3, GFR 30-59 ml/min (HCC) 12/02/2015  . Gastrointestinal hemorrhage 12/01/2015  . Acute kidney injury ([place]) 12/01/2015  . Elevated lactic acid level 12/01/2015  . Essential hypertension 12/01/2015  . History of DVT  (deep vein thrombosis) 10/31/2012  . Hyperlipidemia 10/31/2012  . History of colon cancer 03/29/2010    Past Surgical History:  Procedure Laterality Date  . COLON SURGERY  1998  . COLONOSCOPY WITH PROPOFOL N/A 04/12/2017   Procedure: COLONOSCOPY WITH PROPOFOL;  Surgeon: Milus Banister, MD;  Location: [facility];  Service: Endoscopy;  Laterality: N/A;  . DILATION AND CURETTAGE OF UTERUS  yrs ago  . ESOPHAGOGASTRODUODENOSCOPY (EGD) WITH PROPOFOL N/A 03/08/2017   Procedure: ESOPHAGOGASTRODUODENOSCOPY (EGD) WITH PROPOFOL;  Surgeon: Milus Banister, MD;  [facility];  Service: Endoscopy;  Laterality: N/A;  . ESOPHAGOGASTRODUODENOSCOPY (EGD) WITH PROPOFOL N/A 09/05/2019   Procedure: ESOPHAGOGASTRODUODENOSCOPY (EGD) WITH PROPOFOL;  Surgeon: Yetta Flock, MD;  Location: [facility];  Service: Gastroenterology;  Laterality: N/A;     OB History   No obstetric history on file.     Family History  Problem Relation Age of Onset  . Heart disease Mother   . Colon cancer Neg Hx   . Stomach cancer Neg Hx   . Esophageal cancer Neg Hx   . Pancreatic cancer Neg Hx   . Liver disease Neg Hx     Social History   Tobacco Use  . Smoking status: Never Smoker  . Smokeless tobacco: Never Used  Vaping Use  . Vaping  Use: Never used  Substance Use Topics  . Alcohol use: No  . Drug use: No    Home Medications Prior to Admission medications   Medication Sig Start Date End Date Taking? Authorizing Provider  acetaminophen (TYLENOL) 500 MG tablet Take 1,000 mg by mouth 2 (two) times daily as needed for moderate pain or headache.    [provider]  benzonatate (TESSALON) 200 MG capsule Take 1 capsule (200 mg total) by mouth 3 (three) times daily as needed for cough. 10/15/20   Freddi Starr, MD  Cholecalciferol (VITAMIN D-3) 1000 units CAPS Take 1 tablet by mouth in the morning and at bedtime.     [provider]  Dextromethorphan-guaiFENesin (MUCUS-DM) 30-600  MG TB12 Take 1 tablet by mouth in the morning and at bedtime.    [provider]  ferrous sulfate 325 (65 FE) MG tablet Take 325 mg by mouth 2 (two) times daily with a meal.     [provider]  furosemide (LASIX) 40 MG tablet Take 80 mg by mouth daily.    [provider]  ipratropium-albuterol (DUONEB) 0.5-2.5 (3) MG/3ML SOLN Take 3 mLs by nebulization every 6 (six) hours as needed. 10/15/20   Freddi Starr, MD  nortriptyline (PAMELOR) 10 MG capsule Take 10 mg by mouth 2 (two) times daily.    [provider]  pantoprazole (PROTONIX) 40 MG tablet Take 1 tablet (40 mg total) by mouth 2 (two) times daily before a meal. 09/09/19 07/28/20  Rai, Ripudeep K, MD  potassium chloride SA (KLOR-CON) 20 MEQ tablet Take 20 mEq by mouth 2 (two) times daily.    [provider]  vitamin B-12 (CYANOCOBALAMIN) 1000 MCG tablet Take 1,000 mcg by mouth daily.    [provider]  donepezil (ARICEPT) 10 MG tablet Take 10 mg by mouth at bedtime.  07/28/20  [provider]  metoprolol succinate (TOPROL-XL) 25 MG 24 hr tablet Take 1 tablet (25 mg total) by mouth daily. Patient not taking: Reported on 07/28/2020 09/09/19 07/28/20  Rai, Vernelle Emerald, MD  sucralfate (CARAFATE) 1 GM/10ML suspension Take 10 mLs (1 g total) by mouth 4 (four) times daily -  with meals and at bedtime. Patient not taking: Reported on 07/28/2020 09/09/19 07/28/20  Mendel Corning, MD    Allergies    Patient has no known allergies.  Review of Systems   Review of Systems  Constitutional:       Per HPI, otherwise negative  HENT:       Per HPI, otherwise negative  Respiratory:       Per HPI, otherwise negative  Cardiovascular:       Per HPI, otherwise negative  Gastrointestinal: Positive for abdominal pain, diarrhea and nausea. Negative for vomiting.  Endocrine:       Negative aside from HPI  Genitourinary:       Neg aside from HPI   Musculoskeletal:       Per HPI, otherwise  negative  Skin: Negative.   Neurological: Negative for syncope.    Physical Exam Updated Vital Signs BP 95/73   Pulse (!) 102   Temp 98.4 F (36.9 C) (Oral)   Resp 18   Ht 5\' 5"  (1.651 m)   Wt 75.8 kg   SpO2 96%   BMI 27.79 kg/m   Physical Exam Vitals and nursing note reviewed.  Constitutional:      General: She is not in acute distress.    Appearance: She is well-developed.  HENT:  Head: Normocephalic and atraumatic.  Eyes:     Conjunctiva/sclera: Conjunctivae normal.  Cardiovascular:     Rate and Rhythm: Regular rhythm. Tachycardia present.  Pulmonary:     Effort: Pulmonary effort is normal. No respiratory distress.     Breath sounds: Normal breath sounds. No stridor.  Abdominal:     General: There is no distension.     Tenderness: There is abdominal tenderness. There is guarding.     Comments: LLQ pain w guarding  Skin:    General: Skin is warm and dry.  Neurological:     Mental Status: She is alert and oriented to person, place, and time.     Cranial Nerves: No cranial nerve deficit.     ED Results / Procedures / Treatments   Labs (all labs ordered are listed, but only abnormal results are displayed) Labs Reviewed  COMPREHENSIVE METABOLIC PANEL - Abnormal; Notable for the following components:      Result Value   Sodium 132 (*)    CO2 18 (*)    BUN 69 (*)    Creatinine, Ser 3.23 (*)    Total Protein 8.6 (*)    GFR, Estimated 13 (*)    All other components within normal limits  CBC WITH DIFFERENTIAL/PLATELET - Abnormal; Notable for the following components:   RBC 5.14 (*)    Hemoglobin 15.9 (*)    HCT 47.5 (*)    All other components within normal limits  RESP PANEL BY RT-PCR (FLU A&B, COVID) ARPGX2  MAGNESIUM    EKG None  Radiology CT ABDOMEN PELVIS WO CONTRAST  Result Date: 01/07/2021 CLINICAL DATA:  Diverticulitis suspected 87 year old with diarrhea today.  Left-sided abdominal pain. EXAM: CT ABDOMEN AND PELVIS WITHOUT CONTRAST  TECHNIQUE: Multidetector CT imaging of the abdomen and pelvis was performed following the standard protocol without IV contrast. COMPARISON:  CT 07/28/2020 FINDINGS: Lower chest: The heart is enlarged. Basilar interstitial thickening is similar to prior exam. No acute airspace disease or pleural effusion. Hepatobiliary: No focal hepatic abnormality on noncontrast exam. Multiple calcified gallstones in the gallbladder, including a stone in the gallbladder neck. Chronically dilated common bile duct with probable stone in the upper CBD, series 2, image 20. This was also seen on prior exam. There is no pericholecystic fat stranding. Pancreas: Fatty atrophy.  No ductal dilatation or inflammation. Spleen: Normal in size.  No focal abnormality on noncontrast exam. Adrenals/Urinary Tract: Normal adrenal glands. No hydronephrosis, renal stone, or focal renal abnormality. Mild bilateral renal parenchymal atrophy. Minimally distended urinary bladder. Previous right bladder diverticulum not seen on the current exam. Stomach/Bowel: Decompressed stomach. Duodenal diverticulum without inflammation. No small bowel obstruction or inflammation. Suspected subtotal colectomy with enteric sutures noted in the proximal sigmoid colon. Sigmoid diverticulosis without focal diverticulitis. No bowel wall thickening or inflammatory change. Vascular/Lymphatic: Aortic atherosclerosis and tortuosity. No aortic aneurysm. Infrarenal IVC filter in place, stable in appearance. No bulky abdominopelvic adenopathy. Reproductive: Small calcified uterine fibroid no adnexal mass. Other: No free air, free fluid or ascites. No body wall hernia. There is surgical clips in the right mid abdomen and left upper quadrant Musculoskeletal: Multilevel degenerative change throughout the spine. No acute osseous abnormality or focal bone lesion. IMPRESSION: 1. No acute abnormality in the abdomen/pelvis. 2. Sigmoid diverticulosis without acute diverticulitis. 3.  Gallstones. Chronically dilated common bile duct with probable stone in the upper CBD, also seen on prior exam. No pericholecystic fat stranding to suggest acute cholecystitis. Aortic Atherosclerosis (ICD10-I70.0). Electronically Signed   By: Threasa Beards  San[place] M.D.   On: 01/07/2021 20:14    Procedures Procedures   Medications Ordered in ED Medications  sodium chloride 0.9 % bolus 500 mL (has no administration in time range)  sodium chloride 0.9 % bolus 500 mL (500 mLs Intravenous New Bag/Given 01/07/21 1920)    ED Course  I have reviewed the triage vital signs and the nursing notes.  Pertinent labs & imaging results that were available during my care of the patient were reviewed by me and considered in my medical decision making (see chart for details).   Repeat exam the patient is in similar condition. She has mild hypotension, 95/45.  When she is now accompanied by family member. Together we discussed CT scan which I have reviewed, labs, also reviewed.  CT scan generally reassuring, and though the patient does have some gallstones, no evidence for acute cholecystitis, and the patient's pain is largely left-sided. Given the left-sided pain, diarrhea, CT scan was performed apart to evaluate for diverticulitis, no evidence for this. Given her diarrhea for about 1 week, diminished p.o. intake patient's findings concerning for acute kidney injury consistent with illness. Patient has begun to receive fluid resuscitation here, has Covid test pending, will require admission for resuscitation due to acute kidney injury.  Final Clinical Impression(s) / ED Diagnoses Final diagnoses:  Diarrhea, unspecified type  AKI (acute kidney injury) ([place])      Carmin Muskrat, MD 01/07/21 2119

## 2021-01-07 NOTE — H&P (Signed)
History and Physical    Julia Robbins IHK:[id number] DOB: [birth year] DOA: 01/07/2021  PCP: Merrilee Seashore, MD  Patient coming from: Home, lives with daughter who is at bedside   I have personally briefly reviewed patient's old medical records in [place]  Chief Complaint: nausea, vomiting, and diarrhea  HPI: Julia Robbins is a 87 y.o. 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   Daughter at bedside provides most the history as patient has dementia.  Patient started to have nausea, vomiting and diarrhea about 4 days ago.  Has been having decreased p.o. intake.  Daughter states that normally she cannot get her to drink much even when she is well.  She also notes some left-sided and mid abdominal pain.  Notes that the stool was dark but no bright red blood.  Denies any fever.  No recent new medications or antibiotics.  No recent travels.  No sick contact other than visiting grandkids who has some GI symptoms but that was more than 2 weeks ago.  ED Course: She was afebrile, tachycardic and hypotensive down to systolic of 75I over 43P.  Hemoconcentrated with hemoglobin 15.9, hematocrit of 47.  Mild hyponatremia with sodium 132.  Significant AKI with creatinine of 3.23 from a prior of 1.4.  Review of Systems: Unable to fully obtain given patient's dementia  Past Medical History:  Diagnosis Date  . Abnormal EKG    hx left bundle branch block dating back to 2012 on old ekg's  . Arthritis    degenerative.   . Cholelithiasis 2010   15 mm CBD on CT scan 2010.   Marland Kitchen Colon cancer ([place]) 1998   chemo and colectomy.  T3 N1 stage 3, 2/15 nodes +.   . DVT (deep venous thrombosis) (HCC)    right leg  . Elevated cholesterol   . Family history of adverse reaction  to anesthesia    daughter has post op n/v  . Gastric ulcer 1998  . Greenfield filter in place 1998   for DVT. right leg  . HOH (hard of hearing)    slight    Past Surgical History:  Procedure Laterality Date  . COLON SURGERY  1998  . COLONOSCOPY WITH PROPOFOL N/A 04/12/2017   Procedure: COLONOSCOPY WITH PROPOFOL;  Surgeon: Milus Banister, MD;  Location: [facility];  Service: Endoscopy;  Laterality: N/A;  . DILATION AND CURETTAGE OF UTERUS  yrs ago  . ESOPHAGOGASTRODUODENOSCOPY (EGD) WITH PROPOFOL N/A 03/08/2017   Procedure: ESOPHAGOGASTRODUODENOSCOPY (EGD) WITH PROPOFOL;  Surgeon: Milus Banister, MD;  [facility];  Service: Endoscopy;  Laterality: N/A;  . ESOPHAGOGASTRODUODENOSCOPY (EGD) WITH PROPOFOL N/A 09/05/2019   Procedure: ESOPHAGOGASTRODUODENOSCOPY (EGD) WITH PROPOFOL;  Surgeon: Yetta Flock, MD;  [facility];  Service: Gastroenterology;  Laterality: N/A;     reports that she has never smoked. She has never used smokeless tobacco. She reports that she does not drink alcohol and does not use drugs. Social History  No Known Allergies  Family History  Problem Relation Age of Onset  . Heart disease Mother   . Colon cancer Neg Hx   . Stomach cancer Neg Hx   . Esophageal cancer Neg Hx   . Pancreatic cancer Neg Hx   . Liver disease Neg Hx  Prior to Admission medications   Medication Sig Start Date End Date Taking? Authorizing Provider  Calcium Carbonate+Vitamin D (CALCIUM 600+D3) 600-200 MG-UNIT TABS Take 1 tablet by mouth daily.   Yes [provider]  ferrous sulfate 325 (65 FE) MG tablet Take 325 mg by mouth 2 (two) times daily with a meal.    Yes [provider]  furosemide (LASIX) 40 MG tablet Take 80 mg by mouth daily.   Yes [provider]  nortriptyline (PAMELOR) 10 MG capsule Take 10 mg by mouth 2 (two) times daily.   Yes [provider]  pantoprazole (PROTONIX) 40 MG tablet Take 1 tablet (40 mg  total) by mouth 2 (two) times daily before a meal. 09/09/19 07/28/20 Yes Rai, Ripudeep K, MD  potassium chloride SA (KLOR-CON) 20 MEQ tablet Take 20 mEq by mouth 2 (two) times daily.   Yes [provider]  benzonatate (TESSALON) 200 MG capsule Take 1 capsule (200 mg total) by mouth 3 (three) times daily as needed for cough. Patient not taking: No sig reported 10/15/20   Freddi Starr, MD  ipratropium-albuterol (DUONEB) 0.5-2.5 (3) MG/3ML SOLN Take 3 mLs by nebulization every 6 (six) hours as needed. Patient not taking: Reported on 01/07/2021 10/15/20   Freddi Starr, MD  donepezil (ARICEPT) 10 MG tablet Take 10 mg by mouth at bedtime.  07/28/20  [provider]  metoprolol succinate (TOPROL-XL) 25 MG 24 hr tablet Take 1 tablet (25 mg total) by mouth daily. Patient not taking: Reported on 07/28/2020 09/09/19 07/28/20  Rai, Vernelle Emerald, MD  sucralfate (CARAFATE) 1 GM/10ML suspension Take 10 mLs (1 g total) by mouth 4 (four) times daily -  with meals and at bedtime. Patient not taking: Reported on 07/28/2020 09/09/19 07/28/20  Mendel Corning, MD    Physical Exam: Vitals:   01/07/21 2037 01/07/21 2114 01/07/21 2201 01/07/21 2247  BP: 113/79 95/73 106/71 101/71  Pulse: (!) 101 (!) 102 (!) 103 100  Resp: (!) 27 18 (!) 22 20  Temp:      TempSrc:      SpO2: 97% 96% 96% 93%  Weight:      Height:        Constitutional: NAD, calm, comfortable, thin elderly pleasantly demented lady lying flat in bed Vitals:   01/07/21 2037 01/07/21 2114 01/07/21 2201 01/07/21 2247  BP: 113/79 95/73 106/71 101/71  Pulse: (!) 101 (!) 102 (!) 103 100  Resp: (!) 27 18 (!) 22 20  Temp:      TempSrc:      SpO2: 97% 96% 96% 93%  Weight:      Height:       Eyes: PERRL, lids and conjunctivae normal ENMT: Mucous membranes are moist.  Neck: normal, supple Respiratory: clear to auscultation bilaterally, no wheezing, no crackles. Normal respiratory effort. No accessory muscle use.   Cardiovascular: Regular rate and rhythm, no murmurs / rubs / gallops. No extremity edema. .  Abdomen: Mild tenderness to mid and left lateral abdomen with no rebound tenderness, guarding or rigidity,, no masses palpated.  Bowel sounds positive.  Musculoskeletal: no clubbing / cyanosis. No joint deformity upper and lower extremities.   Skin: no rashes, lesions, ulcers. No induration Neurologic: CN 2-12 grossly intact. Sensation intact,  Strength 5/5 in all 4.  Psychiatric: Alert and oriented to self and family. Normal mood.     Labs on Admission: I have personally reviewed following labs and imaging studies  CBC: Recent Labs  Lab 01/07/21  1842  WBC 6.3  NEUTROABS 4.2  HGB 15.9*  HCT 47.5*  MCV 92.4  PLT 856   Basic Metabolic Panel: Recent Labs  Lab 01/07/21 1842  NA 132*  K 4.1  CL 101  CO2 18*  GLUCOSE 90  BUN 69*  CREATININE 3.23*  CALCIUM 9.2  MG 2.4   GFR: Estimated Creatinine Clearance: 10.6 mL/min (A) (by C-G formula based on SCr of 3.23 mg/dL (H)). Liver Function Tests: Recent Labs  Lab 01/07/21 1842  AST 20  ALT 15  ALKPHOS 64  BILITOT 1.0  PROT 8.6*  ALBUMIN 3.8   No results for input(s): LIPASE, AMYLASE in the last 168 hours. No results for input(s): AMMONIA in the last 168 hours. Coagulation Profile: No results for input(s): INR, PROTIME in the last 168 hours. Cardiac Enzymes: No results for input(s): CKTOTAL, CKMB, CKMBINDEX, TROPONINI in the last 168 hours. BNP (last 3 results) No results for input(s): PROBNP in the last 8760 hours. HbA1C: No results for input(s): HGBA1C in the last 72 hours. CBG: No results for input(s): GLUCAP in the last 168 hours. Lipid Profile: No results for input(s): CHOL, HDL, LDLCALC, TRIG, CHOLHDL, LDLDIRECT in the last 72 hours. Thyroid Function Tests: No results for input(s): TSH, T4TOTAL, FREET4, T3FREE, THYROIDAB in the last 72 hours. Anemia Panel: No results for input(s): VITAMINB12, FOLATE, FERRITIN,  TIBC, IRON, RETICCTPCT in the last 72 hours. Urine analysis:    Component Value Date/Time   COLORURINE YELLOW 07/27/2020 2314   APPEARANCEUR CLEAR 07/27/2020 2314   LABSPEC 1.010 07/27/2020 2314   PHURINE 5.0 07/27/2020 2314   GLUCOSEU NEGATIVE 07/27/2020 2314   HGBUR NEGATIVE 07/27/2020 2314   [place] 07/27/2020 [place] 07/27/2020 2314   PROTEINUR NEGATIVE 07/27/2020 2314   UROBILINOGEN 0.2 10/04/2008 1330   NITRITE NEGATIVE 07/27/2020 2314   LEUKOCYTESUR NEGATIVE 07/27/2020 2314    Radiological Exams on Admission: CT ABDOMEN PELVIS WO CONTRAST  Result Date: 01/07/2021 CLINICAL DATA:  Diverticulitis suspected 87 year old with diarrhea today.  Left-sided abdominal pain. EXAM: CT ABDOMEN AND PELVIS WITHOUT CONTRAST TECHNIQUE: Multidetector CT imaging of the abdomen and pelvis was performed following the standard protocol without IV contrast. COMPARISON:  CT 07/28/2020 FINDINGS: Lower chest: The heart is enlarged. Basilar interstitial thickening is similar to prior exam. No acute airspace disease or pleural effusion. Hepatobiliary: No focal hepatic abnormality on noncontrast exam. Multiple calcified gallstones in the gallbladder, including a stone in the gallbladder neck. Chronically dilated common bile duct with probable stone in the upper CBD, series 2, image 20. This was also seen on prior exam. There is no pericholecystic fat stranding. Pancreas: Fatty atrophy.  No ductal dilatation or inflammation. Spleen: Normal in size.  No focal abnormality on noncontrast exam. Adrenals/Urinary Tract: Normal adrenal glands. No hydronephrosis, renal stone, or focal renal abnormality. Mild bilateral renal parenchymal atrophy. Minimally distended urinary bladder. Previous right bladder diverticulum not seen on the current exam. Stomach/Bowel: Decompressed stomach. Duodenal diverticulum without inflammation. No small bowel obstruction or inflammation. Suspected subtotal colectomy  with enteric sutures noted in the proximal sigmoid colon. Sigmoid diverticulosis without focal diverticulitis. No bowel wall thickening or inflammatory change. Vascular/Lymphatic: Aortic atherosclerosis and tortuosity. No aortic aneurysm. Infrarenal IVC filter in place, stable in appearance. No bulky abdominopelvic adenopathy. Reproductive: Small calcified uterine fibroid no adnexal mass. Other: No free air, free fluid or ascites. No body wall hernia. There is surgical clips in the right mid abdomen and left upper quadrant Musculoskeletal: Multilevel degenerative change throughout  the spine. No acute osseous abnormality or focal bone lesion. IMPRESSION: 1. No acute abnormality in the abdomen/pelvis. 2. Sigmoid diverticulosis without acute diverticulitis. 3. Gallstones. Chronically dilated common bile duct with probable stone in the upper CBD, also seen on prior exam. No pericholecystic fat stranding to suggest acute cholecystitis. Aortic Atherosclerosis (ICD10-I70.0). Electronically Signed   By: Keith Rake M.D.   On: 01/07/2021 20:14      Assessment/Plan  Nausea, vomiting diarrhea likely secondary to viral gastroenteritis No leukocytosis or fever.  No recent antibiotics. Obtain GI stool panel Continue to monitor with fluids Daughter also mentioned dark stool- obtain FOBT -Hgb actually appears heme-concentrated from hypovolemia  Hypotension Due to dehydration Monitor with IV fluids  AKI on CKD 3B Creatinine significant elevated at 3.23 from a prior of 1.4 Received 500 cc normal saline bolus in the ED Continuous IV 75 cc LR fluid Obtain FENA lab Hold home Lasix for now  Combined systolic and diastolic dysfunction Appears dehydrated Daughter at bedside reports that her echo results are erroneous and that patient has never received an echocardiogram in the past. In reviewing of her past documentation, I noted to her that patient was noted to have a 2D echo performed during admission for  GI bleed in 09/2019. This was noted to be done for cardic clearance. Echo showed EF of 40-45% with grade 1 diastolic dysfunction.   DVT prophylaxis:SCD Code Status: Full Family Communication: Plan discussed with daughter at bedside  disposition Plan: Home with observation Consults called:  Admission status: Observation  Level of care: Telemetry  Status is: Observation  The patient remains OBS appropriate and will d/c before 2 midnights.  Dispo: The patient is from: Home              Anticipated d/c is to: Home              Patient currently is not medically stable to d/c.   Difficult to place patient No         Orene Desanctis DO [facility]   If 7PM-7AM, please contact night-coverage [email or web address]   01/07/2021, 11:03 PM

## 2021-01-07 NOTE — ED Triage Notes (Signed)
Ems brings pt in from home. Family reports diarrhea today. Family also reports pt has not ate any food in the past 5 days. Pt complains of left sided abdominal pain.

## 2021-01-08 ENCOUNTER — Other Ambulatory Visit: Payer: Self-pay

## 2021-01-08 ENCOUNTER — Encounter (HOSPITAL_COMMUNITY): Payer: Self-pay | Admitting: Family Medicine

## 2021-01-08 DIAGNOSIS — Z85038 Personal history of other malignant neoplasm of large intestine: Secondary | ICD-10-CM | POA: Diagnosis not present

## 2021-01-08 DIAGNOSIS — A084 Viral intestinal infection, unspecified: Secondary | ICD-10-CM

## 2021-01-08 DIAGNOSIS — Z85831 Personal history of malignant neoplasm of soft tissue: Secondary | ICD-10-CM | POA: Diagnosis not present

## 2021-01-08 DIAGNOSIS — Z79899 Other long term (current) drug therapy: Secondary | ICD-10-CM | POA: Diagnosis not present

## 2021-01-08 DIAGNOSIS — I959 Hypotension, unspecified: Secondary | ICD-10-CM

## 2021-01-08 DIAGNOSIS — N1832 Chronic kidney disease, stage 3b: Secondary | ICD-10-CM | POA: Diagnosis present

## 2021-01-08 DIAGNOSIS — Z86718 Personal history of other venous thrombosis and embolism: Secondary | ICD-10-CM | POA: Diagnosis not present

## 2021-01-08 DIAGNOSIS — F0390 Unspecified dementia without behavioral disturbance: Secondary | ICD-10-CM | POA: Diagnosis present

## 2021-01-08 DIAGNOSIS — Z95828 Presence of other vascular implants and grafts: Secondary | ICD-10-CM | POA: Diagnosis not present

## 2021-01-08 DIAGNOSIS — I130 Hypertensive heart and chronic kidney disease with heart failure and stage 1 through stage 4 chronic kidney disease, or unspecified chronic kidney disease: Secondary | ICD-10-CM | POA: Diagnosis present

## 2021-01-08 DIAGNOSIS — N179 Acute kidney failure, unspecified: Secondary | ICD-10-CM | POA: Diagnosis present

## 2021-01-08 DIAGNOSIS — E860 Dehydration: Secondary | ICD-10-CM | POA: Diagnosis present

## 2021-01-08 DIAGNOSIS — I5040 Unspecified combined systolic (congestive) and diastolic (congestive) heart failure: Secondary | ICD-10-CM

## 2021-01-08 DIAGNOSIS — Z20822 Contact with and (suspected) exposure to covid-19: Secondary | ICD-10-CM | POA: Diagnosis present

## 2021-01-08 DIAGNOSIS — Z9049 Acquired absence of other specified parts of digestive tract: Secondary | ICD-10-CM | POA: Diagnosis not present

## 2021-01-08 LAB — URINALYSIS, ROUTINE W REFLEX MICROSCOPIC
Bilirubin Urine: NEGATIVE
Bilirubin Urine: NEGATIVE
Glucose, UA: NEGATIVE mg/dL
Glucose, UA: NEGATIVE mg/dL
Hgb urine dipstick: NEGATIVE
Ketones, ur: NEGATIVE mg/dL
Ketones, ur: NEGATIVE mg/dL
Leukocytes,Ua: NEGATIVE
Leukocytes,Ua: NEGATIVE
Nitrite: NEGATIVE
Nitrite: NEGATIVE
Protein, ur: NEGATIVE mg/dL
Protein, ur: NEGATIVE mg/dL
Specific Gravity, Urine: >1.03 — ABNORMAL HIGH (ref 1.005–1.030)
Specific Gravity, Urine: 1.02 (ref 1.005–1.030)
pH: 5.5 (ref 5.0–8.0)
pH: 5 (ref 5.0–8.0)

## 2021-01-08 LAB — BASIC METABOLIC PANEL
Anion gap: 13 (ref 5–15)
BUN: 67 mg/dL — ABNORMAL HIGH (ref 8–23)
CO2: 16 mmol/L — ABNORMAL LOW (ref 22–32)
Calcium: 9.2 mg/dL (ref 8.9–10.3)
Chloride: 108 mmol/L (ref 98–111)
Creatinine, Ser: 2.49 mg/dL — ABNORMAL HIGH (ref 0.44–1.00)
GFR, Estimated: 17 mL/min — ABNORMAL LOW (ref >60)
Glucose, Bld: 78 mg/dL (ref 70–99)
Potassium: 4.4 mmol/L (ref 3.5–5.1)
Sodium: 137 mmol/L (ref 135–145)

## 2021-01-08 LAB — CBC
HCT: 46.1 % — ABNORMAL HIGH (ref 36.0–46.0)
Hemoglobin: 15.2 g/dL — ABNORMAL HIGH (ref 12.0–15.0)
MCH: 30.9 pg (ref 26.0–34.0)
MCHC: 33 g/dL (ref 30.0–36.0)
MCV: 93.7 fL (ref 80.0–100.0)
Platelets: 220 10*3/uL (ref 150–400)
RBC: 4.92 MIL/uL (ref 3.87–5.11)
RDW: 12.7 % (ref 11.5–15.5)
WBC: 5.9 10*3/uL (ref 4.0–10.5)
nRBC: 0 % (ref 0.0–0.2)

## 2021-01-08 LAB — URINALYSIS, MICROSCOPIC (REFLEX)
Bacteria, UA: NONE SEEN
WBC, UA: NONE SEEN WBC/hpf (ref 0–5)

## 2021-01-08 LAB — CREATININE, URINE, RANDOM: Creatinine, Urine: 263.68 mg/dL

## 2021-01-08 LAB — SODIUM, URINE, RANDOM: Sodium, Ur: <10 mmol/L

## 2021-01-08 NOTE — Progress Notes (Signed)
PROGRESS NOTE  [name]  DOB: 1923-06-03  PCP: Merrilee Seashore, MD LZJ:[id number]  DOA: 01/07/2021  LOS: 0 days   Chief Complaint  Patient presents with  . Diarrhea    Brief narrative: Julia Robbins is a 87 y.o. 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   Patient presented to the ED with nausea, vomiting and diarrhea for about 4 days with decreased oral intake, dehydration also started with left-sided and mid abdominal pain  In the ED, patient was afebrile, tachycardic and hypotensive to 90s Hemoconcentrated with hemoglobin at 15.9, creatinine elevated to 3.23 from a baseline of 1.4  Subjective: Patient was seen and examined this morning.  Pleasant elderly African-American female.  Lying on bed.  Not in distress continues to have abdominal pain. Chart reviewed Was tachycardic last night.  Seems to have improved this morning.  Creatinine down to 2.49 today  Assessment/Plan: Acute viral gastroenteritis -Presented with nausea, vomiting, diarrhea, abdominal pain. -Symptomatic management with IV fluid, antiemetics.  GI panel sent. -Says last diarrheal episode was yesterday but continues to have nausea. -Continue to monitor clinically  Hypotension History of essential hypertension History of chronic combined systolic and diastolic CHF.   -Patient was hypotensive to 90s at presentation.  Home meds include Lasix 80 mg daily.  Currently on hold.  Patient is getting LR at 75 mL/h which I will continue for today. -Last echo from December 2020 with EF 40 to 45% -Continue to monitor blood pressure at CHF symptoms.  AKI on CKD 3b -Creatinine significant elevated at 3.23 from a prior of 1.4 -Improving with IV fluid and holding of Lasix Recent Labs    07/27/20 2313 01/07/21 1842 01/08/21 0520  BUN 12 69* 67*   CREATININE 1.40* 3.23* 2.49*   Dementia -Supportive care. -Continue Pamelor.  Mobility: Encourage ambulation.  PT eval Code Status:   Code Status: Full Code  Nutritional status: Body mass index is 27.79 kg/m.     Diet Order            Diet Heart Room service appropriate? Yes; Fluid consistency: Thin  Diet effective now                 DVT prophylaxis: SCDs Start: 01/07/21 2300   Antimicrobials:  None Fluid: LR at 75 mL/h Consultants: None Family Communication:  None at bedside  Status is: Observation  The patient will require care spanning > 2 midnights and should be moved to inpatient because: Continues to need IV fluid.  Dispo: The patient is from: Home              Anticipated d/c is to: Home in 1 to 2 days              Patient currently is not medically stable to d/c.   Difficult to place patient No       Infusions:  . lactated ringers 75 mL/hr at 01/08/21 0159    Scheduled Meds: . ferrous sulfate  325 mg Oral BID WC  . nortriptyline  10 mg Oral BID  . potassium chloride SA  20 mEq Oral BID    Antimicrobials: Anti-infectives (From admission, onward)   None      PRN meds:    Objective: Vitals:   01/08/21 0102 01/08/21 0522  BP: 103/77 115/81  Pulse: 93 81  Resp: 20 (!) 24  Temp: 98 F (36.7 C) 98.4 F (36.9 C)  SpO2: 100%     Intake/Output Summary (Last 24 hours) at 01/08/2021 0835 Last data filed at 01/08/2021 0600 Gross per 24 hour  Intake 700.11 ml  Output 200 ml  Net 500.11 ml   Filed Weights   01/07/21 1833  Weight: 75.8 kg   Weight change:  Body mass index is 27.79 kg/m.   Physical Exam: General exam: Pleasant elderly African-American female.  Mild distress because of abdominal pain and nausea Skin: No rashes, lesions or ulcers. HEENT: Atraumatic, normocephalic, no obvious bleeding Lungs: Clear to auscultation bilaterally CVS: Regular rate and rhythm, no murmur GI/Abd soft, mild diffuse tenderness, bowel sound  present CNS: Alert, awake, able to answer appropriately.  Oriented to place and person Psychiatry: Mood appropriate Extremities: No pedal edema, no calf tenderness  Data Review: I have personally reviewed the laboratory data and studies available.  Recent Labs  Lab 01/07/21 1842 01/08/21 0520  WBC 6.3 5.9  NEUTROABS 4.2  --   HGB 15.9* 15.2*  HCT 47.5* 46.1*  MCV 92.4 93.7  PLT 247 220   Recent Labs  Lab 01/07/21 1842 01/08/21 0520  NA 132* 137  K 4.1 4.4  CL 101 108  CO2 18* 16*  GLUCOSE 90 78  BUN 69* 67*  CREATININE 3.23* 2.49*  CALCIUM 9.2 9.2  MG 2.4  --     F/u labs ordered Unresulted Labs (From admission, onward)          Start     Ordered   01/07/21 2309  Occult blood card to lab, stool RN will collect  Once,   STAT       Question:  Specimen to be collected by:  Answer:  RN will collect   01/07/21 2308   01/07/21 2303  Gastrointestinal Panel by PCR , Stool  (Gastrointestinal Panel by PCR, Stool                                                                                                                                                     **Does Not include CLOSTRIDIUM DIFFICILE testing. **If CDIFF testing is needed, place order from the "C Difficile Testing" order set.**)  Once,   STAT        01/07/21 2302          Signed, Terrilee Croak, MD [facility] 01/08/2021

## 2021-01-08 NOTE — Plan of Care (Signed)
  Problem: Education: Goal: Knowledge of General Education information will improve Description: Including pain rating scale, medication(s)/side effects and non-pharmacologic comfort measures Outcome: Progressing   Problem: Nutrition: Goal: Adequate nutrition will be maintained Outcome: Progressing   Problem: Safety: Goal: Ability to remain free from injury will improve Outcome: Progressing   

## 2021-01-09 DIAGNOSIS — N179 Acute kidney failure, unspecified: Secondary | ICD-10-CM | POA: Diagnosis not present

## 2021-01-09 LAB — BASIC METABOLIC PANEL
Anion gap: 8 (ref 5–15)
BUN: 37 mg/dL — ABNORMAL HIGH (ref 8–23)
CO2: 18 mmol/L — ABNORMAL LOW (ref 22–32)
Calcium: 8.7 mg/dL — ABNORMAL LOW (ref 8.9–10.3)
Chloride: 110 mmol/L (ref 98–111)
Creatinine, Ser: 1.31 mg/dL — ABNORMAL HIGH (ref 0.44–1.00)
GFR, Estimated: 38 mL/min — ABNORMAL LOW (ref >60)
Glucose, Bld: 73 mg/dL (ref 70–99)
Potassium: 4.9 mmol/L (ref 3.5–5.1)
Sodium: 136 mmol/L (ref 135–145)

## 2021-01-09 MED ORDER — ACETAMINOPHEN 325 MG PO TABS
650.0000 mg | ORAL_TABLET | Freq: Four times a day (QID) | ORAL | Status: DC | PRN
  Administered 2021-01-09: 650 mg via ORAL
  Filled 2021-01-09: qty 2

## 2021-01-09 MED ORDER — SODIUM CHLORIDE 0.9 % IV SOLN: INTRAVENOUS | Status: DC

## 2021-01-09 NOTE — Progress Notes (Signed)
PROGRESS NOTE  Julia Robbins  DOB: 01-23-1923  PCP: Merrilee Seashore, MD QMG:[id number]  DOA: 01/07/2021  LOS: 1 day   Chief Complaint  Patient presents with  . Diarrhea    Brief narrative: Julia Robbins is a 87 y.o. 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   Patient presented to the ED with nausea, vomiting and diarrhea for about 4 days with decreased oral intake, dehydration also started with left-sided and mid abdominal pain  In the ED, patient was afebrile, tachycardic and hypotensive to 90s Hemoconcentrated with hemoglobin at 15.9, creatinine elevated to 3.23 from a baseline of 1.4 Admitted to hospitalist service. See below for details  Subjective: Patient was seen and examined this morning.  Taking her breakfast.  No nausea, vomiting or diarrhea overnight.  Creatinine gradually improving pending labs today  Assessment/Plan: Acute viral gastroenteritis -Presented with nausea, vomiting, diarrhea, abdominal pain. -Symptomatic management with IV fluid, antiemetics.  GI panel ordered with no stool output since admission.. -Says last diarrheal episode was on the day of admission.  Nausea improving -Continue to monitor clinically  Hypotension History of essential hypertension History of chronic combined systolic and diastolic CHF.   -Patient was hypotensive to 90s at presentation.  Home meds include Lasix 80 mg daily.  Currently on hold.  Continue IV fluid.   -Last echo from December 2020 with EF 40 to 45% -Continue to monitor blood pressure at CHF symptoms.  AKI on CKD 3b -Creatinine significant elevated at 3.23 from a prior of 1.4 -Improving with IV fluid and holding of Lasix.  Pending labs today Recent Labs    07/27/20 2313 01/07/21 1842 01/08/21 0520  BUN 12 69* 67*  CREATININE 1.40* 3.23* 2.49*    Dementia -Supportive care. -Continue Pamelor.  Mobility: Encourage ambulation.  PT eval obtained.  Home with PT recommended Code Status:   Code Status: Full Code  Nutritional status: Body mass index is 27.79 kg/m.     Diet Order            Diet Heart Room service appropriate? Yes; Fluid consistency: Thin  Diet effective now                 DVT prophylaxis: SCDs Start: 01/07/21 2300   Antimicrobials:  None Fluid: Normal saline at 75 mL/h Consultants: None Family Communication:  None at bedside  Status is: Inpatient  Continues to remain inpatient because of need of IV fluid and renal function monitoring  Dispo: The patient is from: Home              Anticipated d/c is to: Home in 1 to 2 days              Patient currently is not medically stable to d/c.   Difficult to place patient No       Infusions:  . sodium chloride 75 mL/hr at 01/09/21 1203    Scheduled Meds: . ferrous sulfate  325 mg Oral BID WC  . nortriptyline  10 mg Oral BID  . potassium chloride SA  20 mEq Oral BID    Antimicrobials: Anti-infectives (From admission, onward)   None      PRN meds:    Objective: Vitals:   01/08/21 2102 01/09/21 0638  BP: 112/72 91/65  Pulse: 92 82  Resp: 17 17  Temp: 98.5 F (  36.9 C) 97.9 F (36.6 C)  SpO2: 93% 97%    Intake/Output Summary (Last 24 hours) at 01/09/2021 1307 Last data filed at 01/09/2021 0500 Gross per 24 hour  Intake 120 ml  Output 450 ml  Net -330 ml   Filed Weights   01/07/21 1833  Weight: 75.8 kg   Weight change:  Body mass index is 27.79 kg/m.   Physical Exam: General exam: Pleasant elderly African-American female.  Not in distress  HEENT: Atraumatic, normocephalic, no obvious bleeding Lungs: Clear to auscultation bilaterally CVS: Regular rate and rhythm, no murmur GI/Abd soft, mild diffuse tenderness, bowel sound present CNS: Alert, awake, able to answer appropriately.  Oriented to place and person Psychiatry:  Mood appropriate Extremities: No pedal edema, no calf tenderness  Data Review: I have personally reviewed the laboratory data and studies available.  Recent Labs  Lab 01/07/21 1842 01/08/21 0520  WBC 6.3 5.9  NEUTROABS 4.2  --   HGB 15.9* 15.2*  HCT 47.5* 46.1*  MCV 92.4 93.7  PLT 247 220   Recent Labs  Lab 01/07/21 1842 01/08/21 0520  NA 132* 137  K 4.1 4.4  CL 101 108  CO2 18* 16*  GLUCOSE 90 78  BUN 69* 67*  CREATININE 3.23* 2.49*  CALCIUM 9.2 9.2  MG 2.4  --     F/u labs ordered Unresulted Labs (From admission, onward)          Start     Ordered   01/10/21 6387  Basic metabolic panel  Daily,   R     Question:  Specimen collection method  Answer:  Lab=Lab collect   01/09/21 1305   01/10/21 0500  CBC with Differential/Platelet  Daily,   R     Question:  Specimen collection method  Answer:  Lab=Lab collect   01/09/21 1305   01/09/21 5643  Basic metabolic panel  ONCE - STAT,   STAT       Question:  Specimen collection method  Answer:  Lab=Lab collect   01/09/21 1305   01/07/21 2309  Occult blood card to lab, stool RN will collect  Once,   STAT       Question:  Specimen to be collected by:  Answer:  RN will collect   01/07/21 2308   01/07/21 2303  Gastrointestinal Panel by PCR , Stool  (Gastrointestinal Panel by PCR, Stool                                                                                                                                                     **Does Not include CLOSTRIDIUM DIFFICILE testing. **If CDIFF testing is needed, place order from the "C Difficile Testing" order set.**)  Once,   STAT        01/07/21 2302  Signed, Terrilee Croak, MD [facility] 01/09/2021

## 2021-01-09 NOTE — Evaluation (Signed)
Physical Therapy Evaluation Patient Details Name: Julia Robbins MRN: [id number] DOB: 11/12/1922 Today's Date: 01/09/2021   History of Present Illness  Pt admitted with N/V/D "likely due to viral gastroenteritis"; weakness, and dehydration.  Pt with hx of R LE DVT with filter in place, colon CA s/p colectomy and dementia  Clinical Impression  Pt admitted as above and presenting with functional mobility limitations 2* generalized weakness/deconditioning, limited endurance and ambulatory balance deficits.  Pt very motivated to progress to dc home with family.    Follow Up Recommendations Home health PT    Equipment Recommendations  None recommended by PT    Recommendations for Other Services       Precautions / Restrictions Precautions Precautions: Fall Restrictions Weight Bearing Restrictions: No      Mobility  Bed Mobility Overal bed mobility: Modified Independent             General bed mobility comments: no physical assist to EOB sitting    Transfers Overall transfer level: Needs assistance   Transfers: Sit to/from Stand Sit to Stand: Min assist;Mod assist         General transfer comment: cues for use of UEs to self assist; physical assist to bring wt up and fwd and to balance in standing  Ambulation/Gait Ambulation/Gait assistance: Min assist Gait Distance (Feet): 33 Feet (and 3' to [facility]) Assistive device: Rolling walker (2 wheeled) Gait Pattern/deviations: Step-to pattern;Step-through pattern;Decreased step length - right;Decreased step length - left;Shuffle;Trunk flexed Gait velocity: decr   General Gait Details: cues for posture and position from RW; assist for balance and RW management; distance ltd by fatigue  Stairs            Wheelchair Mobility    Modified Rankin (Stroke Patients Only)       Balance Overall balance assessment: Needs assistance Sitting-balance support: No upper extremity supported;Feet supported Sitting  balance-Leahy Scale: Good     Standing balance support: Bilateral upper extremity supported Standing balance-Leahy Scale: Poor                               Pertinent Vitals/Pain Pain Assessment: No/denies pain    Home Living Family/patient expects to be discharged to:: Private residence Living Arrangements: Children Available Help at Discharge: Family Type of Home: House Home Access: Level entry     Home Layout: One level Home Equipment: Environmental consultant - 2 wheels;Bedside commode Additional Comments: Pt reports lives with dtr    Prior Function Level of Independence: Independent with assistive device(s)         Comments: using RW at home     Hand Dominance        Extremity/Trunk Assessment   Upper Extremity Assessment Upper Extremity Assessment: Generalized weakness    Lower Extremity Assessment Lower Extremity Assessment: Generalized weakness    Cervical / Trunk Assessment Cervical / Trunk Assessment: Kyphotic  Communication   Communication: No difficulties;HOH  Cognition Arousal/Alertness: Awake/alert Behavior During Therapy: WFL for tasks assessed/performed Overall Cognitive Status: Within Functional Limits for tasks assessed                                        General Comments      Exercises     Assessment/Plan    PT Assessment Patient needs continued PT services  PT Problem List Decreased strength;Decreased range of motion;Decreased activity  tolerance;Decreased balance;Decreased mobility;Decreased knowledge of use of DME       PT Treatment Interventions DME instruction;Gait training;Functional mobility training;Therapeutic activities;Therapeutic exercise;Balance training;Patient/family education    PT Goals (Current goals can be found in the Care Plan section)  Acute Rehab PT Goals Patient Stated Goal: HOME PT Goal Formulation: With patient Time For Goal Achievement: 01/23/21 Potential to Achieve Goals: Good     Frequency Min 3X/week   Barriers to discharge        Co-evaluation               AM-PAC PT "6 Clicks" Mobility  Outcome Measure Help needed turning from your back to your side while in a flat bed without using bedrails?: None Help needed moving from lying on your back to sitting on the side of a flat bed without using bedrails?: None Help needed moving to and from a bed to a chair (including a wheelchair)?: A Lot Help needed standing up from a chair using your arms (e.g., wheelchair or bedside chair)?: A Lot Help needed to walk in hospital room?: A Little Help needed climbing 3-5 steps with a railing? : A Lot 6 Click Score: 17    End of Session Equipment Utilized During Treatment: Gait belt Activity Tolerance: Patient tolerated treatment well;Patient limited by fatigue Patient left: in chair;with call bell/phone within reach;with chair alarm set;with nursing/sitter in room Nurse Communication: Mobility status PT Visit Diagnosis: Difficulty in walking, not elsewhere classified (R26.2)    Time: 8882-8003 PT Time Calculation (min) (ACUTE ONLY): 35 min   Charges:   PT Evaluation $PT Eval Low Complexity: 1 Low PT Treatments $Gait Training: 8-22 mins        [place] Pager [phone] Office [phone]   Faythe Heitzenrater 01/09/2021, 12:55 PM

## 2021-01-10 DIAGNOSIS — N179 Acute kidney failure, unspecified: Secondary | ICD-10-CM | POA: Diagnosis not present

## 2021-01-10 LAB — CBC WITH DIFFERENTIAL/PLATELET
Abs Immature Granulocytes: 0.02 10*3/uL (ref 0.00–0.07)
Basophils Absolute: 0.1 10*3/uL (ref 0.0–0.1)
Basophils Relative: 1 %
Eosinophils Absolute: 0.3 10*3/uL (ref 0.0–0.5)
Eosinophils Relative: 5 %
HCT: 40.6 % (ref 36.0–46.0)
Hemoglobin: 12.9 g/dL (ref 12.0–15.0)
Immature Granulocytes: 0 %
Lymphocytes Relative: 42 %
Lymphs Abs: 2.3 10*3/uL (ref 0.7–4.0)
MCH: 31.1 pg (ref 26.0–34.0)
MCHC: 31.8 g/dL (ref 30.0–36.0)
MCV: 97.8 fL (ref 80.0–100.0)
Monocytes Absolute: 0.8 10*3/uL (ref 0.1–1.0)
Monocytes Relative: 14 %
Neutro Abs: 2.1 10*3/uL (ref 1.7–7.7)
Neutrophils Relative %: 38 %
Platelets: 159 10*3/uL (ref 150–400)
RBC: 4.15 MIL/uL (ref 3.87–5.11)
RDW: 13.1 % (ref 11.5–15.5)
WBC: 5.5 10*3/uL (ref 4.0–10.5)
nRBC: 0 % (ref 0.0–0.2)

## 2021-01-10 LAB — BASIC METABOLIC PANEL
Anion gap: 5 (ref 5–15)
BUN: 25 mg/dL — ABNORMAL HIGH (ref 8–23)
CO2: 18 mmol/L — ABNORMAL LOW (ref 22–32)
Calcium: 8.3 mg/dL — ABNORMAL LOW (ref 8.9–10.3)
Chloride: 113 mmol/L — ABNORMAL HIGH (ref 98–111)
Creatinine, Ser: 1.07 mg/dL — ABNORMAL HIGH (ref 0.44–1.00)
GFR, Estimated: 48 mL/min — ABNORMAL LOW (ref >60)
Glucose, Bld: 85 mg/dL (ref 70–99)
Potassium: 4.3 mmol/L (ref 3.5–5.1)
Sodium: 136 mmol/L (ref 135–145)

## 2021-01-10 MED ORDER — FUROSEMIDE 40 MG PO TABS: 40.0000 mg | ORAL_TABLET | Freq: Every day | ORAL | Status: AC | PRN

## 2021-01-10 NOTE — Progress Notes (Signed)
Physical Therapy Treatment Patient Details Name: Julia Robbins MRN: [id number] DOB: 1923/07/12 Today's Date: 01/10/2021    History of Present Illness Pt admitted with N/V/D "likely due to viral gastroenteritis"; weakness, and dehydration.  Pt with hx of R LE DVT with filter in place, colon CA s/p colectomy and dementia    PT Comments    Pt assisted with ambulating short distance in hallway and fatigues quickly.  Continue to recommend HHPT.  Follow Up Recommendations  Home health PT     Equipment Recommendations  None recommended by PT    Recommendations for Other Services       Precautions / Restrictions Precautions Precautions: Fall Restrictions Weight Bearing Restrictions: No    Mobility  Bed Mobility Overal bed mobility: Modified Independent             General bed mobility comments: increased time    Transfers Overall transfer level: Needs assistance Equipment used: Rolling walker (2 wheeled) Transfers: Sit to/from Stand Sit to Stand: Min assist         General transfer comment: cues for use of UEs to self assist; light assist to rise and steady  Ambulation/Gait Ambulation/Gait assistance: Min guard Gait Distance (Feet): 15 Feet (x2) Assistive device: Rolling walker (2 wheeled) Gait Pattern/deviations: Trunk flexed;Decreased stride length;Step-through pattern Gait velocity: decr   General Gait Details: verbal cues for RW positioning and posture, pt fatigued quickly, standing rest break required   Stairs             Wheelchair Mobility    Modified Rankin (Stroke Patients Only)       Balance Overall balance assessment: Needs assistance Sitting-balance support: No upper extremity supported;Feet supported Sitting balance-Leahy Scale: Good     Standing balance support: Bilateral upper extremity supported Standing balance-Leahy Scale: Poor                              Cognition Arousal/Alertness: Awake/alert Behavior  During Therapy: WFL for tasks assessed/performed Overall Cognitive Status: Within Functional Limits for tasks assessed                                        Exercises      General Comments        Pertinent Vitals/Pain Pain Assessment: No/denies pain    Home Living                      Prior Function            PT Goals (current goals can now be found in the care plan section) Progress towards PT goals: Progressing toward goals    Frequency    Min 3X/week      PT Plan Current plan remains appropriate    Co-evaluation              AM-PAC PT "6 Clicks" Mobility   Outcome Measure  Help needed turning from your back to your side while in a flat bed without using bedrails?: None Help needed moving from lying on your back to sitting on the side of a flat bed without using bedrails?: A Little Help needed moving to and from a bed to a chair (including a wheelchair)?: A Lot Help needed standing up from a chair using your arms (e.g., wheelchair or bedside chair)?: A Lot Help needed to  walk in hospital room?: A Lot Help needed climbing 3-5 steps with a railing? : A Lot 6 Click Score: 15    End of Session Equipment Utilized During Treatment: Gait belt Activity Tolerance: Patient tolerated treatment well Patient left: in chair;with call bell/phone within reach;with chair alarm set;with nursing/sitter in room   PT Visit Diagnosis: Difficulty in walking, not elsewhere classified (R26.2)     Time: 7544-9201 PT Time Calculation (min) (ACUTE ONLY): 18 min  Charges:  $Gait Training: 8-22 mins                    Arlyce Dice, DPT Acute Rehabilitation Services Pager: [phone] Office: [phone]  Mahi Zabriskie,KATHrine E 01/10/2021, 10:59 AM

## 2021-01-10 NOTE — Discharge Summary (Addendum)
Physician Discharge Summary  Julia Robbins PRF:[id number] DOB: Jan 21, 1923 DOA: 01/07/2021  PCP: Julia Seashore, MD  Admit date: 01/07/2021 Discharge date: 01/10/2021  Admitted From: Home Discharge disposition: Home with home health PT   Code Status: Full Code  Diet Recommendation: Cardiac diet  Discharge Diagnosis:   Principal Problem:   AKI (acute kidney injury) ([place]) Active Problems:   Hypotension   Viral gastroenteritis   Combined systolic and diastolic heart failure [facility])   Chief Complaint  Patient presents with  . Diarrhea    Brief narrative: Julia Robbins is a 87 y.o. 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   Patient presented to the ED with nausea, vomiting and diarrhea for about 4 days with decreased oral intake, dehydration also started with left-sided and mid abdominal pain  In the ED, patient was afebrile, tachycardic and hypotensive to 90s Hemoconcentrated with hemoglobin at 15.9, creatinine elevated to 3.23 from a baseline of 1.4 Admitted to hospitalist service. See below for details  Subjective: Patient was seen and examined this morning.  No nausea, vomiting or diarrhea overnight.  Creatinine gradually improving pending labs today  Hospital course: Acute viral gastroenteritis -Presented with nausea, vomiting, diarrhea, abdominal pain. -Symptomatic management done with IV fluid, antiemetics.  -Symptoms improved.  Adequately hydrated.  Okay to discharge to home today.    Hypotension History of essential hypertension History of chronic combined systolic and diastolic CHF.   -Patient was hypotensive to 90s at presentation.  Home meds include Lasix 80 mg daily.  Lasix was held.  Patient was given IV hydration.  Blood pressure currently running in low normal range.  -Last echo from  December 2020 with EF 40 to 45% -I will discharge the patient back home on Lasix 40 mg daily as needed.  AKI on CKD 3b -Creatinine significant elevated at 3.23 from a prior of 1.4 -Improved back to baseline with IV fluids. Recent Labs    07/27/20 2313 01/07/21 1842 01/08/21 0520 01/09/21 1416 01/10/21 0556  BUN 12 69* 67* 37* 25*  CREATININE 1.40* 3.23* 2.49* 1.31* 1.07*   Dementia -Supportive care. -Continue Pamelor.  Okay to discharge home today with home health PT.   Wound care:    Discharge Exam:   Vitals:   01/09/21 0638 01/09/21 1338 01/09/21 2100 01/10/21 0511  BP: 91/65 93/75 99/77  102/69  Pulse: 82 92 84 71  Resp: 17 18  14   Temp: 97.9 F (36.6 C) 98.1 F (36.7 C) 98 F (36.7 C) 97.7 F (36.5 C)  TempSrc: Oral Oral Oral Oral  SpO2: 97% 95% 97% 98%  Weight:      Height:        Body mass index is 27.79 kg/m.  General exam: Pleasant, elderly African-American female.  Propped up in bed.  Not in distress Skin: No rashes, lesions or ulcers. HEENT: Atraumatic, normocephalic, no obvious bleeding Lungs: Clear to auscultation bilaterally CVS: Regular rate and rhythm, no murmur GI/Abd soft, nontender, nondistended, bowel sound present CNS: Alert, awake, oriented x3 Psychiatry: Mood appropriate Extremities: No pedal edema, no calf tenderness  Follow ups:   Discharge Instructions    Diet - low sodium heart healthy   Complete by: As directed    Increase activity slowly   Complete by: As directed    Increase activity slowly   Complete by: As directed  Follow-up Information    Julia Seashore, MD Follow up.   Specialty: Internal Medicine Contact information: [address] [place] [address] [phone]        Care, [facility] Follow up.   Specialty: Home Health Services Contact information: [place] [address] [phone]               Recommendations for Outpatient  Follow-Up:   1. Follow-up with PCP as an outpatient  Discharge Instructions:  Follow with Primary MD Julia Seashore, MD in 7 days   Get CBC/BMP checked in next visit within 1 week by PCP or SNF MD ( we routinely change or add medications that can affect your baseline labs and fluid status, therefore we recommend that you get the mentioned basic workup next visit with your PCP, your PCP may decide not to get them or add new tests based on their clinical decision)  On your next visit with your PCP, please Get Medicines reviewed and adjusted.  Please request your PCP  to go over all Hospital Tests and Procedure/Radiological results at the follow up, please get all Hospital records sent to your Prim MD by signing hospital release before you go home.  Activity: As tolerated with Full fall precautions use walker/cane & assistance as needed  For Heart failure patients - Check your Weight same time everyday, if you gain over 2 pounds, or you develop in leg swelling, experience more shortness of breath or chest pain, call your Primary MD immediately. Follow Cardiac Low Salt Diet and 1.5 lit/day fluid restriction.  If you have smoked or chewed Tobacco in the last 2 yrs please stop smoking, stop any regular Alcohol  and or any Recreational drug use.  If you experience worsening of your admission symptoms, develop shortness of breath, life threatening emergency, suicidal or homicidal thoughts you must seek medical attention immediately by calling 911 or calling your MD immediately  if symptoms less severe.  You Must read complete instructions/literature along with all the possible adverse reactions/side effects for all the Medicines you take and that have been prescribed to you. Take any new Medicines after you have completely understood and accpet all the possible adverse reactions/side effects.   Do not drive, operate heavy machinery, perform activities at heights, swimming or participation in  water activities or provide baby sitting services if your were admitted for syncope or siezures until you have seen by Primary MD or a Neurologist and advised to do so again.  Do not drive when taking Pain medications.  Do not take more than prescribed Pain, Sleep and Anxiety Medications  Wear Seat belts while driving.   Please note You were cared for by a hospitalist during your hospital stay. If you have any questions about your discharge medications or the care you received while you were in the hospital after you are discharged, you can call the unit and asked to speak with the hospitalist on call if the hospitalist that took care of you is not available. Once you are discharged, your primary care physician will handle any further medical issues. Please note that NO REFILLS for any discharge medications will be authorized once you are discharged, as it is imperative that you return to your primary care physician (or establish a relationship with a primary care physician if you do not have one) for your aftercare needs so that they can reassess your need for medications and monitor your lab values.  Allergies as of 01/10/2021   No Known Allergies     Medication List    TAKE these medications   benzonatate 200 MG capsule Commonly known as: TESSALON Take 1 capsule (200 mg total) by mouth 3 (three) times daily as needed for cough.   Calcium 600+D3 600-200 MG-UNIT Tabs Generic drug: Calcium Carbonate+Vitamin D Take 1 tablet by mouth daily.   ferrous sulfate 325 (65 FE) MG tablet Take 325 mg by mouth 2 (two) times daily with a meal.   furosemide 40 MG tablet Commonly known as: LASIX Take 1 tablet (40 mg total) by mouth daily as needed for fluid or edema. What changed:   how much to take  when to take this  reasons to take this   ipratropium-albuterol 0.5-2.5 (3) MG/3ML Soln Commonly known as: DUONEB Take 3 mLs by nebulization every 6 (six) hours as needed.   nortriptyline  10 MG capsule Commonly known as: PAMELOR Take 10 mg by mouth 2 (two) times daily.   pantoprazole 40 MG tablet Commonly known as: Protonix Take 1 tablet (40 mg total) by mouth 2 (two) times daily before a meal.   potassium chloride SA 20 MEQ tablet Commonly known as: KLOR-CON Take 20 mEq by mouth 2 (two) times daily.            Durable Medical Equipment  (From admission, onward)         Start     Ordered   01/10/21 1002  For home use only DME 3 n 1  Once        01/10/21 1001          Time coordinating discharge: 35 minutes  The results of significant diagnostics from this hospitalization (including imaging, microbiology, ancillary and laboratory) are listed below for reference.    Procedures and Diagnostic Studies:   CT ABDOMEN PELVIS WO CONTRAST  Result Date: 01/07/2021 CLINICAL DATA:  Diverticulitis suspected 87 year old with diarrhea today.  Left-sided abdominal pain. EXAM: CT ABDOMEN AND PELVIS WITHOUT CONTRAST TECHNIQUE: Multidetector CT imaging of the abdomen and pelvis was performed following the standard protocol without IV contrast. COMPARISON:  CT 07/28/2020 FINDINGS: Lower chest: The heart is enlarged. Basilar interstitial thickening is similar to prior exam. No acute airspace disease or pleural effusion. Hepatobiliary: No focal hepatic abnormality on noncontrast exam. Multiple calcified gallstones in the gallbladder, including a stone in the gallbladder neck. Chronically dilated common bile duct with probable stone in the upper CBD, series 2, image 20. This was also seen on prior exam. There is no pericholecystic fat stranding. Pancreas: Fatty atrophy.  No ductal dilatation or inflammation. Spleen: Normal in size.  No focal abnormality on noncontrast exam. Adrenals/Urinary Tract: Normal adrenal glands. No hydronephrosis, renal stone, or focal renal abnormality. Mild bilateral renal parenchymal atrophy. Minimally distended urinary bladder. Previous right bladder  diverticulum not seen on the current exam. Stomach/Bowel: Decompressed stomach. Duodenal diverticulum without inflammation. No small bowel obstruction or inflammation. Suspected subtotal colectomy with enteric sutures noted in the proximal sigmoid colon. Sigmoid diverticulosis without focal diverticulitis. No bowel wall thickening or inflammatory change. Vascular/Lymphatic: Aortic atherosclerosis and tortuosity. No aortic aneurysm. Infrarenal IVC filter in place, stable in appearance. No bulky abdominopelvic adenopathy. Reproductive: Small calcified uterine fibroid no adnexal mass. Other: No free air, free fluid or ascites. No body wall hernia. There is surgical clips in the right mid abdomen and left upper quadrant Musculoskeletal: Multilevel degenerative change throughout the spine. No acute osseous abnormality or focal bone lesion. IMPRESSION: 1. No  acute abnormality in the abdomen/pelvis. 2. Sigmoid diverticulosis without acute diverticulitis. 3. Gallstones. Chronically dilated common bile duct with probable stone in the upper CBD, also seen on prior exam. No pericholecystic fat stranding to suggest acute cholecystitis. Aortic Atherosclerosis (ICD10-I70.0). Electronically Signed   By: Keith Rake M.D.   On: 01/07/2021 20:14     Labs:   Basic Metabolic Panel: Recent Labs  Lab 01/07/21 1842 01/08/21 0520 01/09/21 1416 01/10/21 0556  NA 132* 137 136 136  K 4.1 4.4 4.9 4.3  CL 101 108 110 113*  CO2 18* 16* 18* 18*  GLUCOSE 90 78 73 85  BUN 69* 67* 37* 25*  CREATININE 3.23* 2.49* 1.31* 1.07*  CALCIUM 9.2 9.2 8.7* 8.3*  MG 2.4  --   --   --    GFR Estimated Creatinine Clearance: 32 mL/min (A) (by C-G formula based on SCr of 1.07 mg/dL (H)). Liver Function Tests: Recent Labs  Lab 01/07/21 1842  AST 20  ALT 15  ALKPHOS 64  BILITOT 1.0  PROT 8.6*  ALBUMIN 3.8   No results for input(s): LIPASE, AMYLASE in the last 168 hours. No results for input(s): AMMONIA in the last 168  hours. Coagulation profile No results for input(s): INR, PROTIME in the last 168 hours.  CBC: Recent Labs  Lab 01/07/21 1842 01/08/21 0520 01/10/21 0556  WBC 6.3 5.9 5.5  NEUTROABS 4.2  --  2.1  HGB 15.9* 15.2* 12.9  HCT 47.5* 46.1* 40.6  MCV 92.4 93.7 97.8  PLT 247 220 159   Cardiac Enzymes: No results for input(s): CKTOTAL, CKMB, CKMBINDEX, TROPONINI in the last 168 hours. BNP: Invalid input(s): POCBNP CBG: No results for input(s): GLUCAP in the last 168 hours. D-Dimer No results for input(s): DDIMER in the last 72 hours. Hgb A1c No results for input(s): HGBA1C in the last 72 hours. Lipid Profile No results for input(s): CHOL, HDL, LDLCALC, TRIG, CHOLHDL, LDLDIRECT in the last 72 hours. Thyroid function studies No results for input(s): TSH, T4TOTAL, T3FREE, THYROIDAB in the last 72 hours.  Invalid input(s): FREET3 Anemia work up No results for input(s): VITAMINB12, FOLATE, FERRITIN, TIBC, IRON, RETICCTPCT in the last 72 hours. Microbiology Recent Results (from the past 240 hour(s))  Resp Panel by RT-PCR (Flu A&B, Covid) Nasopharyngeal Swab     Status: None   Collection Time: 01/07/21 10:15 PM   Specimen: Nasopharyngeal Swab; Nasopharyngeal(NP) swabs in vial transport medium  Result Value Ref Range Status   SARS Coronavirus 2 by RT PCR NEGATIVE NEGATIVE Final    Comment: (NOTE) SARS-CoV-2 target nucleic acids are NOT DETECTED.  The SARS-CoV-2 RNA is generally detectable in upper respiratory specimens during the acute phase of infection. The lowest concentration of SARS-CoV-2 viral copies this assay can detect is 138 copies/mL. A negative result does not preclude SARS-Cov-2 infection and should not be used as the sole basis for treatment or other patient management decisions. A negative result may occur with  improper specimen collection/handling, submission of specimen other than nasopharyngeal swab, presence of viral mutation(s) within the areas targeted by this  assay, and inadequate number of viral copies(<138 copies/mL). A negative result must be combined with clinical observations, patient history, and epidemiological information. The expected result is Negative.  Fact Sheet for Patients:  EntrepreneurPulse.com.au  Fact Sheet for Healthcare Providers:  IncredibleEmployment.be  This test is no t yet approved or cleared by the [place] FDA and  has been authorized for detection and/or diagnosis of SARS-CoV-2 by FDA under an  Emergency Use Authorization (EUA). This EUA will remain  in effect (meaning this test can be used) for the duration of the COVID-19 declaration under Section 564(b)(1) of the Act, 21 U.S.C.section 360bbb-3(b)(1), unless the authorization is terminated  or revoked sooner.       Influenza A by PCR NEGATIVE NEGATIVE Final   Influenza B by PCR NEGATIVE NEGATIVE Final    Comment: (NOTE) The Xpert Xpress SARS-CoV-2/FLU/RSV plus assay is intended as an aid in the diagnosis of influenza from Nasopharyngeal swab specimens and should not be used as a sole basis for treatment. Nasal washings and aspirates are unacceptable for Xpert Xpress SARS-CoV-2/FLU/RSV testing.  Fact Sheet for Patients: EntrepreneurPulse.com.au  Fact Sheet for Healthcare Providers: IncredibleEmployment.be  This test is not yet approved or cleared by the [place] FDA and has been authorized for detection and/or diagnosis of SARS-CoV-2 by FDA under an Emergency Use Authorization (EUA). This EUA will remain in effect (meaning this test can be used) for the duration of the COVID-19 declaration under Section 564(b)(1) of the Act, 21 U.S.C. section 360bbb-3(b)(1), unless the authorization is terminated or revoked.  Performed at [facility], [place] [address], [place], [place] [address]      Signed: Marlowe Aschoff Chukwudi Ewen  [facility] 01/10/2021, 2:48  PM

## 2021-01-10 NOTE — TOC Initial Note (Signed)
Transition of Care [facility]) - Initial/Assessment Note    Patient Details  Name: Julia Robbins MRN: [id number] Date of Birth: March 17, 1923  Transition of [facility]) CM/SW Contact:    Maizee Reinhold, Marjie Skiff, RN Phone Number: 01/10/2021, 10:48 AM  Clinical Narrative:                   Expected Discharge Plan: [place] Barriers to Discharge: No Barriers Identified   Patient Goals and CMS Choice Patient states their goals for this hospitalization and ongoing recovery are:: To get home CMS Medicare.gov Compare Post Acute Care list provided to:: Patient Choice offered to / list presented to : Patient  Expected Discharge Plan and Services Expected Discharge Plan: [place]   Discharge Planning Services: CM Consult Post Acute Care Choice: [place] arrangements for the past 2 months: Apartment Expected Discharge Date: 01/10/21               DME Arranged: 3-N-1 DME Agency: [employer] Date DME Agency Contacted: 01/10/21 Time DME Agency Contacted: 0900   HH Arranged: PT [place]: [place] Date [facility] Agency Contacted: 01/10/21 Time [place] Agency Contacted: [phone] Representative spoke with at [place]: Tommi Rumps  Prior Living Arrangements/Services Living arrangements for the past 2 months: Apartment Lives with:: Adult Children Patient language and need for interpreter reviewed:: Yes Do you feel safe going back to the place where you live?: Yes      Need for Family Participation in Patient Care: Yes (Comment)     Criminal Activity/Legal Involvement Pertinent to Current Situation/Hospitalization: No - Comment as needed  Activities of Daily Living Home Assistive Devices/Equipment: Eyeglasses,Dentures (specify type) ADL Screening (condition at time of admission) Patient's cognitive ability adequate to safely complete daily activities?: Yes Is the patient deaf or have difficulty hearing?: No Does the patient have difficulty seeing, even when  wearing glasses/contacts?: No Does the patient have difficulty concentrating, remembering, or making decisions?: No Patient able to express need for assistance with ADLs?: Yes Does the patient have difficulty dressing or bathing?: Yes Independently performs ADLs?: Yes (appropriate for developmental age) Does the patient have difficulty walking or climbing stairs?: Yes Weakness of Legs: Both Weakness of Arms/Hands: Both  Permission Sought/Granted Permission sought to share information with : Chartered certified accountant granted to share information with : Yes, Verbal Permission Granted     Permission granted to share info w AGENCY: [place]        Emotional Assessment Appearance:: Appears stated age Attitude/Demeanor/Rapport: Charismatic Affect (typically observed): Calm Orientation: : Oriented to Self,Oriented to Place,Oriented to  Time,Oriented to Situation Alcohol / Substance Use: Not Applicable    Admission diagnosis:  AKI (acute kidney injury) ([place]) [N17.9] Diarrhea, unspecified type [R19.7] Patient Active Problem List   Diagnosis Date Noted  . Hypotension 01/08/2021  . Viral gastroenteritis 01/08/2021  . Combined systolic and diastolic heart failure ([place]) 01/08/2021  . AKI (acute kidney injury) ([place]) 01/07/2021  . Symptomatic anemia   . Acute combined systolic and diastolic heart failure ([place])   . Moderate aortic stenosis   . Moderate aortic regurgitation   . Acute lower GI bleeding 09/04/2019  . Hematemesis 09/04/2019  . Cholelithiasis 09/04/2019  . Abnormal abdominal CT scan   . RLQ abdominal pain   . Acute blood loss anemia 12/02/2015  . CKD (chronic kidney disease) stage 3, GFR 30-59 ml/min (HCC) 12/02/2015  . Gastrointestinal hemorrhage 12/01/2015  . Acute kidney injury ([place]) 12/01/2015  .  Elevated lactic acid level 12/01/2015  . Essential hypertension 12/01/2015  . History of DVT (deep vein thrombosis) 10/31/2012  . Hyperlipidemia 10/31/2012  .  History of colon cancer 03/29/2010   PCP:  Merrilee Seashore, MD Pharmacy:   [place], [place] [address] [place] [address] Phone: [phone] Fax: [place], [address] [place] [address] Phone: [phone] Fax: [phone]     Social Determinants of Health (SDOH) Interventions    Readmission Risk Interventions No flowsheet data found.

## 2021-01-12 ENCOUNTER — Other Ambulatory Visit: Payer: Self-pay | Admitting: *Deleted

## 2021-01-12 NOTE — Patient Outreach (Signed)
[place] [facility]) Care Management  [place]  01/12/2021   LYNLEIGH KOVACK 1923-04-18 [id number]   Telephone Assessment   Referral Date :12/21/20 Referral Source:Insurance Plan Referral Reason:Assess for complex case management needs [place]  Patient with [facility] Admission , 4/8-4/11/22 Dx: Acute Kidney injury, Viral gastroenteritis.  Subjective:   Unsuccessful outreach call to patient/daughter Kathe Becton, DPR, able to leave a HIPAA compliant voicemail message for return call.   Plan:  Follow-up: If no return call will schedule follow up call in the next 4 business days.    Joylene Draft, RN, BSN  [place] Management Coordinator  [phone]- Mobile [phone]- Toll Free Main Office

## 2021-01-13 ENCOUNTER — Other Ambulatory Visit: Payer: Self-pay | Admitting: *Deleted

## 2021-01-13 NOTE — Patient Outreach (Signed)
[facility]) Care Management  01/13/2021  Julia Robbins December 07, 1922 [id number]   RED on EMMI Alert  Day:1 Date:01/12/21 Red Alert Reason: Scheduled a follow-up appointment? No  Did medication routine change? Yes   Patient with [facility] Admission , 4/8-4/11/22 Dx: Acute Kidney injury, Viral gastroenteritis.  Outreach Attempt #1 Subjective: Unsuccessful outreach call to patient/daughter [employer], no answer able to leave a HIPAA compliant voice mail message for return call.   Plan If no return call will schedule outreach call in the next 4 business days.    Joylene Draft, RN, BSN  [place] Management Coordinator  [phone]- Mobile [phone]- Toll Free Main Office

## 2021-01-15 DIAGNOSIS — I130 Hypertensive heart and chronic kidney disease with heart failure and stage 1 through stage 4 chronic kidney disease, or unspecified chronic kidney disease: Secondary | ICD-10-CM | POA: Diagnosis not present

## 2021-01-15 DIAGNOSIS — N1832 Chronic kidney disease, stage 3b: Secondary | ICD-10-CM | POA: Diagnosis not present

## 2021-01-15 DIAGNOSIS — I5042 Chronic combined systolic (congestive) and diastolic (congestive) heart failure: Secondary | ICD-10-CM | POA: Diagnosis not present

## 2021-01-15 DIAGNOSIS — M19011 Primary osteoarthritis, right shoulder: Secondary | ICD-10-CM | POA: Diagnosis not present

## 2021-01-17 ENCOUNTER — Telehealth: Payer: Self-pay | Admitting: *Deleted

## 2021-01-17 NOTE — Telephone Encounter (Signed)
   Telephone encounter was:  Unsuccessful.  01/17/2021 Name: Julia Robbins MRN: [id number] DOB: September 09, 1923  Unsuccessful outbound call made today to assist with:  Food Insecurity  Outreach Attempt:  2nd Attempt  A HIPAA compliant voice message was left requesting a return call.  Instructed patient to call back at [place], Care Management  [phone] [address] , [place] [address] Email : [email or web address]

## 2021-01-18 ENCOUNTER — Other Ambulatory Visit: Payer: Self-pay | Admitting: *Deleted

## 2021-01-18 DIAGNOSIS — M19011 Primary osteoarthritis, right shoulder: Secondary | ICD-10-CM | POA: Diagnosis not present

## 2021-01-18 DIAGNOSIS — I130 Hypertensive heart and chronic kidney disease with heart failure and stage 1 through stage 4 chronic kidney disease, or unspecified chronic kidney disease: Secondary | ICD-10-CM | POA: Diagnosis not present

## 2021-01-18 DIAGNOSIS — I5042 Chronic combined systolic (congestive) and diastolic (congestive) heart failure: Secondary | ICD-10-CM | POA: Diagnosis not present

## 2021-01-18 DIAGNOSIS — N1832 Chronic kidney disease, stage 3b: Secondary | ICD-10-CM | POA: Diagnosis not present

## 2021-01-18 NOTE — Patient Outreach (Signed)
[facility]) Care Management  [place]  01/18/2021   Julia Robbins 05/05/1923 [id number]   Telephone Assessment  RED on EMMI Alert  Day:1 Date:01/12/21 Red Alert Reason: Scheduled a follow-up appointment? No  Did medication routine change? Yes   Patient with [facility] Admission , 4/8-4/11/22 Dx: Acute Kidney injury, Viral gastroenteritis.   Outreach attempt #2 Subjective:  Unsuccessful outreach call to patient/daughter [employer], no answer able to leave a HIPAA compliant voice mail message for return call.   Plan If no return call will schedule outreach call in the next 4 business days.   Joylene Draft, RN, BSN  [place] Management Coordinator  [phone]- Mobile [phone]- Toll Free Main Office

## 2021-01-20 ENCOUNTER — Telehealth: Payer: Self-pay | Admitting: *Deleted

## 2021-01-20 NOTE — Telephone Encounter (Signed)
   Telephone encounter was:  Unsuccessful.  01/20/2021 Name: Julia Robbins MRN: [id number] DOB: 23-Jan-1923  Unsuccessful outbound call made today to assist with:  Transportation Needs , Food Insecurity and Caregiver Stress  Outreach Attempt:  3rd Attempt.  Referral closed unable to contact patient.  A HIPAA compliant voice message was left requesting a return call.  Instructed patient to call back at   Instructed patient to call back at [phone]  at their earliest convenience. 3x called will close assit if patient daughter returns call    [place], Care Management  [phone] [address] , [place] [address] Email : [email or web address]

## 2021-01-21 ENCOUNTER — Other Ambulatory Visit: Payer: Self-pay | Admitting: *Deleted

## 2021-01-21 NOTE — Patient Outreach (Signed)
[facility]) Care Management  01/21/2021  NITHILA SUMNERS Dec 06, 1922 [id number]   Telephone Assessment  RED on EMMI Alert  Day: #1 Date: 01/12/21 Reason: Scheduled a follow-up appointment? No  Did medication routine change? Yes    Patient with [facility] Admission , 4/8-4/11/22 Dx: Acute Kidney injury, Viral gastroenteritis.  Outreach Attempt #3 Unsuccessful call to patient/daughter Kathe Becton, no answer able to leave a HIPAA compliant voicemail message for return call.   Patient/daughter previously engaged for [facility] care management services .  Unsuccessful outreach call for follow up and assessment completion.   Plan Unsuccessful call attempts x 3 for EMMI red flag alert. Will plan return call in the next 4 weeks.    Joylene Draft, RN, BSN  [place] Management Coordinator  [phone]- Mobile [phone]- Toll Free Main Office

## 2021-01-25 ENCOUNTER — Other Ambulatory Visit: Payer: Self-pay | Admitting: *Deleted

## 2021-01-25 ENCOUNTER — Encounter: Payer: Self-pay | Admitting: *Deleted

## 2021-01-25 DIAGNOSIS — I5042 Chronic combined systolic (congestive) and diastolic (congestive) heart failure: Secondary | ICD-10-CM | POA: Diagnosis not present

## 2021-01-25 DIAGNOSIS — M19011 Primary osteoarthritis, right shoulder: Secondary | ICD-10-CM | POA: Diagnosis not present

## 2021-01-25 DIAGNOSIS — I130 Hypertensive heart and chronic kidney disease with heart failure and stage 1 through stage 4 chronic kidney disease, or unspecified chronic kidney disease: Secondary | ICD-10-CM | POA: Diagnosis not present

## 2021-01-25 DIAGNOSIS — N1832 Chronic kidney disease, stage 3b: Secondary | ICD-10-CM | POA: Diagnosis not present

## 2021-01-25 NOTE — Patient Outreach (Addendum)
[facility]) Care Management  [place]  01/25/2021   Julia Robbins [birth year] [id number]   Telephone Assessment RED on EMMI Alert  Day: #1 Date: 01/12/21 Reason: Scheduled a follow-up appointment? No  Did medication routine change? Yes   Patient with [facility] Admission , 4/8-4/11/22 Dx: Acute Kidney injury, Viral gastroenteritis  Referral Date :12/21/20 Referral Source:Insurance Plan Referral Reason:Assess for complex case management needs Insurance:[employer]    Subjective:  Incoming call from [facility] office  patient daughter Julia Robbins called office to follow up on calls she was receiving from by number, but reports a different name coming up on her phone when by number dialed her phone to leave messages. She states that she has my name/number saved in her phone and will follow up and reenter.  Discussed recent calls to follow up on EMMI red alert call after patient recent hospital admission.  Discussed emmi red alert, Scheduled follow visit with provider? No, she discussed plans to possibly change provider, she discussed plans to look into [facility] practice that is near by home, she plans to visit center than schedule visit for patient. Reinforced importance of sooner medical follow up, medication review after hospital discharge.  Did medication routine change, yes?, Daughter reports patient taking medications as prescribed.  She discussed patient is regaining her strength,working on  getting appetite back, she makes sure patient is getting adequate fluid intake. She denies patient having episodes of nausea or vomiting.   Discussed prior agreement for  [facility] care management, need for assessment/care planning, Julia Robbins states that she has something else to do today,and not able to complete assessment , she agrees to specific date and time for follow up.  She explains that she has received Advanced directive packet and  following up on completing.    Plan Will plan follow up call in the next week to complete assessment and goal setting.  Unable to complete assessment within 30 days, unsuccessful outreaches.    Joylene Draft, RN, BSN  [place] Management Coordinator  [phone]- Mobile [phone]- Toll Free Main Office

## 2021-01-26 ENCOUNTER — Ambulatory Visit: Payer: Self-pay | Admitting: *Deleted

## 2021-01-27 DIAGNOSIS — I5042 Chronic combined systolic (congestive) and diastolic (congestive) heart failure: Secondary | ICD-10-CM | POA: Diagnosis not present

## 2021-01-27 DIAGNOSIS — M19011 Primary osteoarthritis, right shoulder: Secondary | ICD-10-CM | POA: Diagnosis not present

## 2021-01-27 DIAGNOSIS — I130 Hypertensive heart and chronic kidney disease with heart failure and stage 1 through stage 4 chronic kidney disease, or unspecified chronic kidney disease: Secondary | ICD-10-CM | POA: Diagnosis not present

## 2021-01-27 DIAGNOSIS — N1832 Chronic kidney disease, stage 3b: Secondary | ICD-10-CM | POA: Diagnosis not present

## 2021-02-01 ENCOUNTER — Ambulatory Visit: Payer: Self-pay | Admitting: *Deleted

## 2021-02-02 ENCOUNTER — Ambulatory Visit: Payer: Self-pay | Admitting: *Deleted

## 2021-02-02 DIAGNOSIS — I130 Hypertensive heart and chronic kidney disease with heart failure and stage 1 through stage 4 chronic kidney disease, or unspecified chronic kidney disease: Secondary | ICD-10-CM | POA: Diagnosis not present

## 2021-02-02 DIAGNOSIS — I5042 Chronic combined systolic (congestive) and diastolic (congestive) heart failure: Secondary | ICD-10-CM | POA: Diagnosis not present

## 2021-02-02 DIAGNOSIS — M19011 Primary osteoarthritis, right shoulder: Secondary | ICD-10-CM | POA: Diagnosis not present

## 2021-02-02 DIAGNOSIS — N1832 Chronic kidney disease, stage 3b: Secondary | ICD-10-CM | POA: Diagnosis not present

## 2021-02-03 ENCOUNTER — Other Ambulatory Visit: Payer: Self-pay | Admitting: *Deleted

## 2021-02-03 DIAGNOSIS — F0391 Unspecified dementia with behavioral disturbance: Secondary | ICD-10-CM

## 2021-02-03 DIAGNOSIS — I5032 Chronic diastolic (congestive) heart failure: Secondary | ICD-10-CM

## 2021-02-03 NOTE — Patient Outreach (Signed)
[facility]) Care Management  [place]  02/03/2021   Julia Robbins Feb 01, 1923 [id number]   Telephone Assessment    Patient with [facility] Admission , 4/8-4/11/22 Dx: Acute Kidney injury, Viral gastroenteritis  Referral Date :12/21/20 Referral Source:Insurance Plan Referral Reason:Assess for complex case management needs Insurance:[employer]  Subjective: Successful outreach call to patient/daughter. She reports patient doing okay,getting some strength back. She continues to drink at least one ensure each day along with foods.  She continues to tolerate food and drink being picky about foods that she eats.  She states that patient has not had symptoms of increase in swelling/shortness of breath. She has not been  weighing patient at home but she will begin as patient agrees.    Objective:   Encounter Medications:  Outpatient Encounter Medications as of 02/03/2021  Medication Sig  . benzonatate (TESSALON) 200 MG capsule Take 1 capsule (200 mg total) by mouth 3 (three) times daily as needed for cough.  . Calcium Carbonate+Vitamin D 600-200 MG-UNIT TABS Take 1 tablet by mouth daily.  . ferrous sulfate 325 (65 FE) MG tablet Take 325 mg by mouth 2 (two) times daily with a meal.   . furosemide (LASIX) 40 MG tablet Take 1 tablet (40 mg total) by mouth daily as needed for fluid or edema.  . nortriptyline (PAMELOR) 10 MG capsule Take 10 mg by mouth 2 (two) times daily.  . pantoprazole (PROTONIX) 40 MG tablet Take 1 tablet (40 mg total) by mouth 2 (two) times daily before a meal.  . potassium chloride SA (KLOR-CON) 20 MEQ tablet Take 20 mEq by mouth 2 (two) times daily.  Marland Kitchen ipratropium-albuterol (DUONEB) 0.5-2.5 (3) MG/3ML SOLN Take 3 mLs by nebulization every 6 (six) hours as needed. (Patient not taking: No sig reported)  . [DISCONTINUED] donepezil (ARICEPT) 10 MG tablet Take 10 mg by mouth at bedtime.  . [DISCONTINUED] metoprolol succinate  (TOPROL-XL) 25 MG 24 hr tablet Take 1 tablet (25 mg total) by mouth daily. (Patient not taking: No sig reported)  . [DISCONTINUED] sucralfate (CARAFATE) 1 GM/10ML suspension Take 10 mLs (1 g total) by mouth 4 (four) times daily -  with meals and at bedtime. (Patient not taking: No sig reported)   No facility-administered encounter medications on file as of 02/03/2021.    Functional Status:  In your present state of health, do you have any difficulty performing the following activities: 01/08/2021  Hearing? N  Vision? N  Difficulty concentrating or making decisions? N  Walking or climbing stairs? Y  Dressing or bathing? Y  Doing errands, shopping? Y  Some recent data might be hidden    Fall/Depression Screening: Fall Risk  04/17/2014  Falls in the past year? No  Risk for fall due to : Impaired balance/gait  Risk for fall due to: Comment uses cane, yellow arm band on   PHQ 2/9 Scores 02/03/2021  Exception Documentation Other- indicate reason in comment box  Not completed patient unable to answer questions   Transportation Needs: No Transportation Needs  . Lack of Transportation (Medical): No  . Lack of Transportation (Non-Medical): No   Food Insecurity: No Food Insecurity  . Worried About Charity fundraiser in the Last Year: Never true  . Ran Out of Food in the Last Year: Never true   Assessment:  Goals Addressed            This Visit's Progress   . Care for the [facility]  Timeframe:  Long-Range Goal Priority:  Medium Start Date:    02/03/21                         Expected End Date:    04/30/21                   Follow Up Date 02/23/21   - eat healthy - join a support group    Why is this important?    People who care for a person with dementia often feel stress.   Too much stress can be harmful to both of you.   You may feel depressed, exhausted, have trouble sleeping or have health problems.   You need to take care of yourself and reach out for help  when you need it.   There are some things that can be done to keep the quality of your life satisfying.     Notes: Discussed with daughter her current plans for [facility] day program for Adult memory care program . Encourage to view support programs for caregiver available at center. Will place referral for care guide regarding caregiver resource support classes in community as daughter request    . Find Help in My Community       Timeframe:  Long-Range Goal Priority:  Medium Start Date:     02/03/21                        Expected End Date:     04/30/21                  Follow Up Date 02/23/21   - begin a notebook of services in my neighborhood or community    Why is this important?    Knowing how and where to find help for yourself or family in your neighborhood and community is an important skill.   You will want to take some steps to learn how.    Notes:  Discussed plan  to seek Dementia caregiver support group education class. Reviewed education handout mailing.     . Make and Keep All Appointments       Timeframe:  Long-Range Goal Priority:  High Start Date:   02/03/21                          Expected End Date:   03/31/21                    Follow Up Date 02/23/21   - keep a calendar with appointment dates    Why is this important?    Part of staying healthy is seeing the doctor for follow-up care.   If you forget your appointments, there are some things you can do to stay on track.    Notes:  Daughter states appointment has been made for post discharge visit with provider at Dr. Ashby Dawes . Stressed importance of attending post discharge visit .     Marland Kitchen Prevent Falls-Dementia       Timeframe:  Long-Range Goal Priority:  Medium Start Date:   02/03/21                          Expected End Date:   04/30/21                    Follow  Up Date 02/23/21    - use a cane or walker - always wear low-heeled or flat shoes or slippers with nonskid soles    Why is  this important?    There may be trouble with balance and getting around. Falls can happen.    Notes:  Reinforced participation with home health therapy, reviewed fall prevention measures , using rolling ( caregiver to discuss with home therapy if patient has appropriate size walker or if new on needed, limit scatter rugs, discuss importance of nutrition reinforced continued use of ensure daily     . Track and Manage Symptoms-Heart Failure       Timeframe:  Long-Range Goal Priority:  Medium Start Date:   02/03/21                          Expected End Date:    04/30/21                   Follow Up Date 02/23/21   - follow rescue plan if symptoms flare-up - know when to call the doctor - track symptoms and what helps feel better or worse    Why is this important?    You will be able to handle your symptoms better if you keep track of them.   Making some simple changes to your lifestyle will help.   Eating healthy is one thing you can do to take good care of yourself.    Notes: Encouraged monitoring weights consistently to determine sudden weight gain or worsening symptoms to notify MD sooner. Discussed limiting sodium/salt in in foods.        Plan:  Follow-up:  Patient agrees to Care Plan and Follow-up.Will schedule return call in the next week.  Will send PCP barrier involvement letter, and initial visit note  Will send patient after visit summary   Joylene Draft, RN, BSN  [place] Management Coordinator  [phone]- Mobile [phone]- [place]

## 2021-02-04 DIAGNOSIS — M19011 Primary osteoarthritis, right shoulder: Secondary | ICD-10-CM | POA: Diagnosis not present

## 2021-02-04 DIAGNOSIS — N1832 Chronic kidney disease, stage 3b: Secondary | ICD-10-CM | POA: Diagnosis not present

## 2021-02-04 DIAGNOSIS — I5042 Chronic combined systolic (congestive) and diastolic (congestive) heart failure: Secondary | ICD-10-CM | POA: Diagnosis not present

## 2021-02-04 DIAGNOSIS — I130 Hypertensive heart and chronic kidney disease with heart failure and stage 1 through stage 4 chronic kidney disease, or unspecified chronic kidney disease: Secondary | ICD-10-CM | POA: Diagnosis not present

## 2021-02-04 NOTE — Addendum Note (Signed)
Addended by: Joylene Draft A on: 02/04/2021 01:47 PM   Modules accepted: Orders

## 2021-02-09 ENCOUNTER — Telehealth: Payer: Self-pay | Admitting: Internal Medicine

## 2021-02-09 NOTE — Telephone Encounter (Signed)
   Telephone encounter was:  Unsuccessful.  02/09/2021 Name: Julia Robbins MRN: [id number] DOB: 11/16/1922  Unsuccessful outbound call made today to assist with:  Caregiver Stress  Outreach Attempt:  1st Attempt  A HIPAA compliant voice message was left requesting a return call.  Instructed patient to call back at [phone]. I called and left a message letting her know that if she can't reach me to leave a message with the concerns she has for caring for her mother and what her email address is so that I can send her resources; especially a coming up resource where three is a free class for caregiver skills for family caregivers on June 16th from 6-8pm that might be really great for her.   [place], Care Management Phone: [phone] Email: [email or web address]

## 2021-02-11 DIAGNOSIS — I130 Hypertensive heart and chronic kidney disease with heart failure and stage 1 through stage 4 chronic kidney disease, or unspecified chronic kidney disease: Secondary | ICD-10-CM | POA: Diagnosis not present

## 2021-02-11 DIAGNOSIS — I5042 Chronic combined systolic (congestive) and diastolic (congestive) heart failure: Secondary | ICD-10-CM | POA: Diagnosis not present

## 2021-02-11 DIAGNOSIS — M19011 Primary osteoarthritis, right shoulder: Secondary | ICD-10-CM | POA: Diagnosis not present

## 2021-02-11 DIAGNOSIS — N1832 Chronic kidney disease, stage 3b: Secondary | ICD-10-CM | POA: Diagnosis not present

## 2021-02-14 DIAGNOSIS — K529 Noninfective gastroenteritis and colitis, unspecified: Secondary | ICD-10-CM | POA: Diagnosis not present

## 2021-02-14 DIAGNOSIS — N179 Acute kidney failure, unspecified: Secondary | ICD-10-CM | POA: Diagnosis not present

## 2021-02-21 ENCOUNTER — Telehealth: Payer: Self-pay | Admitting: Internal Medicine

## 2021-02-21 NOTE — Telephone Encounter (Signed)
   Telephone encounter was:  Unsuccessful.  02/21/2021 Name: Julia Robbins MRN: [id number] DOB: Apr 17, 1923  Unsuccessful outbound call made today to assist with:  Caregiver Stress  Outreach Attempt:  2nd Attempt  A HIPAA compliant voice message was left requesting a return call.  Instructed patient to call back at [phone].  [place], Care Management Phone: [phone] Email: [email or web address]

## 2021-02-22 ENCOUNTER — Ambulatory Visit: Payer: Self-pay | Admitting: *Deleted

## 2021-02-22 ENCOUNTER — Telehealth: Payer: Self-pay | Admitting: Internal Medicine

## 2021-02-22 NOTE — Telephone Encounter (Signed)
   Telephone encounter was:  Unsuccessful.  02/22/2021 Name: Julia Robbins MRN: [id number] DOB: [birth year]  Unsuccessful outbound call made today to assist with:  Caregiver Stress  Outreach Attempt:  3rd Attempt.  Referral closed unable to contact patient.  A HIPAA compliant voice message was left requesting a return call.  Instructed patient to call back at [phone].  [place], Care Management Phone: [phone] Email: [email or web address]

## 2021-02-23 ENCOUNTER — Other Ambulatory Visit: Payer: Self-pay | Admitting: *Deleted

## 2021-02-23 NOTE — Patient Outreach (Signed)
[facility]) Care Management  [place]  02/23/2021   Julia Robbins 1922-12-15 [id number]   Telephone Assessment    Patient with [facility] Admission , 4/8-4/11/22 Dx: Acute Kidney injury, Viral gastroenteritis  Referral Date :12/21/20 Referral Source:Insurance Plan Referral Reason:Assess for complex case management needs Insurance:[employer]  Subjective:  Outreach call to patient daughter Kathe Becton she reports patient is doing okay , holding her own.  She has completed home health services. She reports patient tolerating walks in home has a new walker. She reports patient appetite is still not good, she continues to try make sure protein in diet. Weight down one pound, denies patient with recurrent diarrhea.  She reports patient without increase in swelling or shortness of breath. She has recent visit with PCP and concern related to elevation in  liver enzymes with follow up appointment for lab appointment.   Objective:   Encounter Medications:  Outpatient Encounter Medications as of 02/23/2021  Medication Sig  . benzonatate (TESSALON) 200 MG capsule Take 1 capsule (200 mg total) by mouth 3 (three) times daily as needed for cough.  . Calcium Carbonate+Vitamin D 600-200 MG-UNIT TABS Take 1 tablet by mouth daily.  . ferrous sulfate 325 (65 FE) MG tablet Take 325 mg by mouth 2 (two) times daily with a meal.   . furosemide (LASIX) 40 MG tablet Take 1 tablet (40 mg total) by mouth daily as needed for fluid or edema.  Marland Kitchen ipratropium-albuterol (DUONEB) 0.5-2.5 (3) MG/3ML SOLN Take 3 mLs by nebulization every 6 (six) hours as needed. (Patient not taking: No sig reported)  . nortriptyline (PAMELOR) 10 MG capsule Take 10 mg by mouth 2 (two) times daily.  . pantoprazole (PROTONIX) 40 MG tablet Take 1 tablet (40 mg total) by mouth 2 (two) times daily before a meal.  . potassium chloride SA (KLOR-CON) 20 MEQ tablet Take 20 mEq by mouth 2 (two)  times daily.  . [DISCONTINUED] donepezil (ARICEPT) 10 MG tablet Take 10 mg by mouth at bedtime.  . [DISCONTINUED] metoprolol succinate (TOPROL-XL) 25 MG 24 hr tablet Take 1 tablet (25 mg total) by mouth daily. (Patient not taking: No sig reported)  . [DISCONTINUED] sucralfate (CARAFATE) 1 GM/10ML suspension Take 10 mLs (1 g total) by mouth 4 (four) times daily -  with meals and at bedtime. (Patient not taking: No sig reported)   No facility-administered encounter medications on file as of 02/23/2021.    Functional Status:  In your present state of health, do you have any difficulty performing the following activities: 01/08/2021  Hearing? N  Vision? N  Difficulty concentrating or making decisions? N  Walking or climbing stairs? Y  Dressing or bathing? Y  Doing errands, shopping? Y  Some recent data might be hidden    Fall/Depression Screening: Fall Risk  04/17/2014  Falls in the past year? No  Risk for fall due to : Impaired balance/gait  Risk for fall due to: Comment uses cane, yellow arm band on   PHQ 2/9 Scores 02/03/2021  Exception Documentation Other- indicate reason in comment box  Not completed patient unable to answer questions    Assessment:  Goals Addressed            This Visit's Progress   . Care for the Caregiver-Dementia       Timeframe:  Long-Range Goal Priority:  Medium Start Date:    02/03/21  Expected End Date:    04/30/21                   Follow Up Date 03/31/21   - eat healthy - join a support group    Why is this important?    People who care for a person with dementia often feel stress.   Too much stress can be harmful to both of you.   You may feel depressed, exhausted, have trouble sleeping or have health problems.   You need to take care of yourself and reach out for help when you need it.   There are some things that can be done to keep the quality of your life satisfying.    Barriers: Knowledge  Notes:Reviewed  information on dementia support class, encouraged attending as able Discuss self care     . Find Help in My Community   On track    Timeframe:  Long-Range Goal Priority:  Medium Start Date:     02/03/21                        Expected End Date:     04/30/21                  Follow Up Date 03/31/21   - begin a notebook of services in my neighborhood or community    Why is this important?    Knowing how and where to find help for yourself or family in your neighborhood and community is an important skill.   You will want to take some steps to learn how.   Barriers: Knowledge  Notes:  [facility].     . Make and Keep All Appointments   On track    Timeframe:  Long-Range Goal Priority:  High Start Date:   02/03/21                          Expected End Date:   03/31/21                    Follow Up Date 03/31/21   - keep a calendar with appointment dates    Why is this important?    Part of staying healthy is seeing the doctor for follow-up care.   If you forget your appointments, there are some things you can do to stay on track.  Barriers: None  Notes:  Commended for attending post dc visit with PCP and upcoming follow appointment     . Prevent Falls-Dementia   On track    Timeframe:  Long-Range Goal Priority:  Medium Start Date:   02/03/21                          Expected End Date:   04/30/21                    Follow Up Date 03/31/21   - use a cane or walker - always wear low-heeled or flat shoes or slippers with nonskid soles    Why is this important?    There may be trouble with balance and getting around. Falls can happen.   Barriers: Knowledge  Notes:  Reviewed having new walker , and tub bench on order . Encouraged continued mobility as tolerated , fall prevention measures review. Reviewed protein sources to  include in diet, in addition to nutrition supplements    . Track and Manage Symptoms-Heart Failure   On track     Timeframe:  Long-Range Goal Priority:  Medium Start Date:   02/03/21                          Expected End Date:    04/30/21                   Follow Up Date 03/31/21   - follow rescue plan if symptoms flare-up - know when to call the doctor - track symptoms and what helps feel better or worse    Why is this important?    You will be able to handle your symptoms better if you keep track of them.   Making some simple changes to your lifestyle will help.   Eating healthy is one thing you can do to take good care of yourself.   Barriers: Knowledge  Notes: Reviewed recent weight, assessed signs /symptoms of heart failure .        Plan:  Follow-up:  Patient agrees to Care Plan and Follow-up. Will plan return call in the next month.  Emphasized notifying MD sooner for concerns of worsening symptoms, notifying Care Coordinator sooner for concerns or care coordination needs.   Joylene Draft, RN, BSN  [place] Management Coordinator  [phone]- Mobile [phone]- Toll Free Main Office

## 2021-03-08 DIAGNOSIS — I872 Venous insufficiency (chronic) (peripheral): Secondary | ICD-10-CM | POA: Diagnosis not present

## 2021-03-08 DIAGNOSIS — B351 Tinea unguium: Secondary | ICD-10-CM | POA: Diagnosis not present

## 2021-03-08 DIAGNOSIS — I70213 Atherosclerosis of native arteries of extremities with intermittent claudication, bilateral legs: Secondary | ICD-10-CM | POA: Diagnosis not present

## 2021-03-08 DIAGNOSIS — G603 Idiopathic progressive neuropathy: Secondary | ICD-10-CM | POA: Diagnosis not present

## 2021-03-08 DIAGNOSIS — L84 Corns and callosities: Secondary | ICD-10-CM | POA: Diagnosis not present

## 2021-03-17 ENCOUNTER — Other Ambulatory Visit: Payer: Self-pay | Admitting: *Deleted

## 2021-03-17 NOTE — Patient Outreach (Signed)
[facility]) Care Management  [place]  03/17/2021   Julia Robbins Jul 08, 1923 [id number]  Telephone Assessment    Referral Date : 12/21/20 Referral Source: Insurance Plan Referral Reason:Assess for complex case management needs  Insurance:[employer]  87 year old female with PMHX: that includes, Hypertension , chronic kidney disease , heart failure .  Subjective:  Successful outreach call to patient daughter Kathe Becton, she states patient is doing fairly well. She reports patient tolerating mobility in home, has arranged support during the day while daughter is at work.  She discussed patient using duoneb treatment prn for reports shortness of breath with activity reports use about once daily usually at night, she reports patient having usual non productive daughter reports treatments ordered previously for lung condition. She denies patient having increase in swelling , no sudden weight gain or worsening of cough.   Objective:   Encounter Medications:  Outpatient Encounter Medications as of 03/17/2021  Medication Sig   benzonatate (TESSALON) 200 MG capsule Take 1 capsule (200 mg total) by mouth 3 (three) times daily as needed for cough.   Calcium Carbonate+Vitamin D 600-200 MG-UNIT TABS Take 1 tablet by mouth daily.   ferrous sulfate 325 (65 FE) MG tablet Take 325 mg by mouth 2 (two) times daily with a meal.    furosemide (LASIX) 40 MG tablet Take 1 tablet (40 mg total) by mouth daily as needed for fluid or edema.   ipratropium-albuterol (DUONEB) 0.5-2.5 (3) MG/3ML SOLN Take 3 mLs by nebulization every 6 (six) hours as needed. (Patient not taking: No sig reported)   nortriptyline (PAMELOR) 10 MG capsule Take 10 mg by mouth 2 (two) times daily.   pantoprazole (PROTONIX) 40 MG tablet Take 1 tablet (40 mg total) by mouth 2 (two) times daily before a meal.   potassium chloride SA (KLOR-CON) 20 MEQ tablet Take 20 mEq by mouth 2 (two) times daily.    [DISCONTINUED] donepezil (ARICEPT) 10 MG tablet Take 10 mg by mouth at bedtime.   [DISCONTINUED] metoprolol succinate (TOPROL-XL) 25 MG 24 hr tablet Take 1 tablet (25 mg total) by mouth daily. (Patient not taking: No sig reported)   [DISCONTINUED] sucralfate (CARAFATE) 1 GM/10ML suspension Take 10 mLs (1 g total) by mouth 4 (four) times daily -  with meals and at bedtime. (Patient not taking: No sig reported)   No facility-administered encounter medications on file as of 03/17/2021.    Functional Status:  In your present state of health, do you have any difficulty performing the following activities: 01/08/2021  Hearing? N  Vision? N  Difficulty concentrating or making decisions? N  Walking or climbing stairs? Y  Dressing or bathing? Y  Doing errands, shopping? Y  Some recent data might be hidden    Fall/Depression Screening: Fall Risk  04/17/2014  Falls in the past year? No  Risk for fall due to : Impaired balance/gait  Risk for fall due to: Comment uses cane, yellow arm band on   PHQ 2/9 Scores 02/03/2021  Exception Documentation Other- indicate reason in comment box  Not completed patient unable to answer questions    Assessment:   Care Plan Care Plan : Heart Failure (Adult)  Updates made by Alfonzo Feller, RN since 03/17/2021 12:00 AM     Problem: Symptom Exacerbation (Heart Failure)   Priority: Medium  Onset Date: 02/03/2021     Goal: Symptom Exacerbation Prevented or Minimized as evidenced by reporting no fluid  weight increase over the next  30 days   Start Date: 02/03/2021  Expected End Date: 03/31/2021  Priority: Medium  Note:   Evidence-based guidance:  Establish a mutually-agreed-upon early intervention process to communicate with primary care provider when signs/symptoms worsen.   Facilitate timely visit, usually within 1 week, with primary care provider following hospital discharge.  Barriers: Health Behaviors Knowledge  Notes: reviewed worsening symptoms of  HF, and action plan, discussed use of lasix as prescribed for prn  sudden wt gain of 3 pound in a day, denied need for recent use.     Task: Identify and Minimize Risk of Heart Failure Exacerbation   Due Date: 03/31/2021  Priority: Routine  Note:   Care Management Activities:    - rescue (action) plan reviewed - self-awareness of signs/symptoms of worsening disease encouraged    Notes:  Denies concerns for  new or worsening concerns for HF symptoms, discussed [place] follow up pcp to avoid readmission.    Problem: Disease Progression (Heart Failure)   Priority: Medium  Onset Date: 02/03/2021     Long-Range Goal: Health optimized as evidenced by no readmission over the next 60 days   Start Date: 02/03/2021  Expected End Date: 04/30/2021  Priority: Medium  Note:   Evidence-based guidance:  Perform or refer to a registered dietitian for a nutrition assessment and nutrition-focused physical exam.   Identify potential micronutrient deficiencies, such as iron, vitamin D and thiamin.  Assess need for potential diet and fluid modification, such as reduced sodium or fluid intake.  Minimize unnecessary dietary restrictions to increase oral intake. Note: Sodium restriction should be individualized to the patient and clinical status.  Facilitate home monitoring of weight.   Barriers: Health Behaviors Knowledge  Notes: Discussed worsening of heart failure symptoms , encouraged continued heart failure daily management of checking weights, limiting salt in diet, taking medications as prescribed.Will send [facility] calendar and HF packet .     Task: Optimize Health   Due Date: 04/30/2021  Priority: Routine  Note:   Care Management Activities:    - home monitoring of blood pressure encouraged - home monitoring of weight gain or loss encouraged - strategies to maintain appetite promoted    Notes: Reviewed current weight of 159 no sudden weight gain or worsening symptoms, discussed use of duo neb for  reports shortness of breath once daily encouraged notifying MD of worsening symptoms sooner. Daughter reports history of COPD , Will send [facility] calendar discussed information on chronic condition and action plan information . Encouraged to discuss patient medical concerns at next visit        Goals Addressed             This Visit's Progress    Care for the Caregiver-Dementia   On track    Timeframe:  Long-Range Goal Priority:  Medium Start Date:    02/03/21                         Expected End Date:    04/30/21                   Follow Up Date 03/31/21   - eat healthy - join a support group    Why is this important?   People who care for a person with dementia often feel stress.  Too much stress can be harmful to both of you.  You may feel depressed, exhausted, have trouble sleeping or have health problems.  You need to take care  of yourself and reach out for help when you need it.  There are some things that can be done to keep the quality of your life satisfying.    Barriers: Knowledge  Notes:Discussed follow up dementia support class/resource provided,unable to attend session session provided.       Find Help in My Community   On track    Timeframe:  Long-Range Goal Priority:  Medium Start Date:     02/03/21                        Expected End Date:     04/30/21                  Follow Up Date 03/31/21   - begin a notebook of services in my neighborhood or community    Why is this important?   Knowing how and where to find help for yourself or family in your neighborhood and community is an important skill.  You will want to take some steps to learn how.   Barriers: Knowledge  Notes:  Discussed current plan with 24 support at home with daughter and arranged family sitters. Daughter still considering [facility]       Make and Keep All Appointments   On track    Timeframe:  Long-Range Goal Priority:  High Start Date:   02/03/21                           Expected End Date:   03/31/21                    Follow Up Date 03/31/21   - keep a calendar with appointment dates    Why is this important?   Part of staying healthy is seeing the doctor for follow-up care.  If you forget your appointments, there are some things you can do to stay on track.  Barriers: None  Notes:  Reviewed upcoming appointment with new provider       Prevent Falls-Dementia   On track    Timeframe:  Long-Range Goal Priority:  Medium Start Date:   02/03/21                          Expected End Date:   04/30/21                    Follow Up Date 03/31/21   - use a cane or walker - always wear low-heeled or flat shoes or slippers with nonskid soles    Why is this important?   There may be trouble with balance and getting around. Falls can happen.   Barriers: Knowledge  Notes:  Reviewed having new walker ,tolerating mobility no falls. Nutrition intake reviewed benefits, patient tolerating diet .      Track and Manage Symptoms-Heart Failure   On track    Timeframe:  Long-Range Goal Priority:  Medium Start Date:   02/03/21                          Expected End Date:    04/30/21                   Follow Up Date 03/31/21   - follow rescue plan if symptoms flare-up - know when to call the doctor -  track symptoms and what helps feel better or worse    Why is this important?   You will be able to handle your symptoms better if you keep track of them.  Making some simple changes to your lifestyle will help.  Eating healthy is one thing you can do to take good care of yourself.   Barriers: Knowledge  Notes: discussed  recent weight, reviewed for worsening symptoms of heart failure  Will send [facility] calendar. .          Plan:  Follow-up: Patient agrees to Care Plan and Follow-up. Will schedule follow up call in the next 3 weeks, regarding   ongoing [facility] CM services .Noted patient plan to change to [facility] practice for PCP Follow up.  Will send [facility] calendar and  heart failure packet   Joylene Draft, RN, BSN  [place] Management Coordinator  [phone]- Mobile [phone]- [place]

## 2021-03-22 DIAGNOSIS — E611 Iron deficiency: Secondary | ICD-10-CM | POA: Diagnosis not present

## 2021-03-22 DIAGNOSIS — C169 Malignant neoplasm of stomach, unspecified: Secondary | ICD-10-CM | POA: Diagnosis not present

## 2021-03-22 DIAGNOSIS — K219 Gastro-esophageal reflux disease without esophagitis: Secondary | ICD-10-CM | POA: Diagnosis not present

## 2021-03-22 DIAGNOSIS — R600 Localized edema: Secondary | ICD-10-CM | POA: Diagnosis not present

## 2021-03-22 DIAGNOSIS — J479 Bronchiectasis, uncomplicated: Secondary | ICD-10-CM | POA: Diagnosis not present

## 2021-03-22 DIAGNOSIS — E876 Hypokalemia: Secondary | ICD-10-CM | POA: Diagnosis not present

## 2021-03-22 DIAGNOSIS — I5041 Acute combined systolic (congestive) and diastolic (congestive) heart failure: Secondary | ICD-10-CM | POA: Diagnosis not present

## 2021-03-22 DIAGNOSIS — Z0189 Encounter for other specified special examinations: Secondary | ICD-10-CM | POA: Diagnosis not present

## 2021-04-14 ENCOUNTER — Other Ambulatory Visit: Payer: Self-pay | Admitting: *Deleted

## 2021-04-14 NOTE — Patient Outreach (Signed)
[facility]) Care Management  04/14/2021  Julia Robbins 09-Apr-1923 [id number]   Telephone Assessment    Subjective Outreach call to patient daughter Julia Robbins, she discussed Julia Robbins is doing about the same. She denies worsening symptoms of heart failure. She discussed patient memory continues to decline. She has support of daughter in law that stays with patient during the day.  Patient daughter states patient has had initial visit with  new PCP at [facility] and has follow up visit in the next week.She states that she thinks she spoke with care manager at initial visit.   Encouraged daughter to discuss any ongoing care needs and discussed having a list of questions.  Discussed with [facility] is outside [facility] management services for care management, she voiced understanding as I previously discussed at last visit . She voiced appreciation of services and information provided.    Plan [facility] care management services will be closed as patient is now followed PCP at [facility] , outside [facility] services.   Joylene Draft, RN, BSN  [place] Management Coordinator  [phone]- Mobile [phone]- Toll Free Main Office

## 2021-05-03 ENCOUNTER — Encounter (HOSPITAL_COMMUNITY): Payer: Self-pay

## 2021-05-03 ENCOUNTER — Inpatient Hospital Stay (HOSPITAL_COMMUNITY)
Admission: EM | Admit: 2021-05-03 | Discharge: 2021-06-02 | DRG: 871 | Disposition: E | Payer: Medicare Other | Attending: Student | Admitting: Student

## 2021-05-03 ENCOUNTER — Emergency Department (HOSPITAL_COMMUNITY): Payer: Medicare Other

## 2021-05-03 ENCOUNTER — Other Ambulatory Visit: Payer: Self-pay

## 2021-05-03 DIAGNOSIS — I248 Other forms of acute ischemic heart disease: Secondary | ICD-10-CM | POA: Diagnosis present

## 2021-05-03 DIAGNOSIS — I447 Left bundle-branch block, unspecified: Secondary | ICD-10-CM | POA: Diagnosis present

## 2021-05-03 DIAGNOSIS — Z85028 Personal history of other malignant neoplasm of stomach: Secondary | ICD-10-CM

## 2021-05-03 DIAGNOSIS — Z20822 Contact with and (suspected) exposure to covid-19: Secondary | ICD-10-CM | POA: Diagnosis present

## 2021-05-03 DIAGNOSIS — Z9181 History of falling: Secondary | ICD-10-CM

## 2021-05-03 DIAGNOSIS — R300 Dysuria: Secondary | ICD-10-CM

## 2021-05-03 DIAGNOSIS — D696 Thrombocytopenia, unspecified: Secondary | ICD-10-CM | POA: Diagnosis present

## 2021-05-03 DIAGNOSIS — H9190 Unspecified hearing loss, unspecified ear: Secondary | ICD-10-CM | POA: Diagnosis present

## 2021-05-03 DIAGNOSIS — Z9221 Personal history of antineoplastic chemotherapy: Secondary | ICD-10-CM

## 2021-05-03 DIAGNOSIS — G9341 Metabolic encephalopathy: Secondary | ICD-10-CM | POA: Diagnosis present

## 2021-05-03 DIAGNOSIS — I350 Nonrheumatic aortic (valve) stenosis: Secondary | ICD-10-CM

## 2021-05-03 DIAGNOSIS — Z9049 Acquired absence of other specified parts of digestive tract: Secondary | ICD-10-CM

## 2021-05-03 DIAGNOSIS — I77819 Aortic ectasia, unspecified site: Secondary | ICD-10-CM | POA: Diagnosis present

## 2021-05-03 DIAGNOSIS — E7800 Pure hypercholesterolemia, unspecified: Secondary | ICD-10-CM | POA: Diagnosis present

## 2021-05-03 DIAGNOSIS — N1831 Chronic kidney disease, stage 3a: Secondary | ICD-10-CM | POA: Diagnosis present

## 2021-05-03 DIAGNOSIS — J9601 Acute respiratory failure with hypoxia: Secondary | ICD-10-CM | POA: Diagnosis present

## 2021-05-03 DIAGNOSIS — I82409 Acute embolism and thrombosis of unspecified deep veins of unspecified lower extremity: Secondary | ICD-10-CM | POA: Diagnosis present

## 2021-05-03 DIAGNOSIS — Z515 Encounter for palliative care: Secondary | ICD-10-CM

## 2021-05-03 DIAGNOSIS — I130 Hypertensive heart and chronic kidney disease with heart failure and stage 1 through stage 4 chronic kidney disease, or unspecified chronic kidney disease: Secondary | ICD-10-CM | POA: Diagnosis present

## 2021-05-03 DIAGNOSIS — Z85831 Personal history of malignant neoplasm of soft tissue: Secondary | ICD-10-CM

## 2021-05-03 DIAGNOSIS — E440 Moderate protein-calorie malnutrition: Secondary | ICD-10-CM | POA: Diagnosis present

## 2021-05-03 DIAGNOSIS — R6520 Severe sepsis without septic shock: Secondary | ICD-10-CM | POA: Diagnosis present

## 2021-05-03 DIAGNOSIS — I5041 Acute combined systolic (congestive) and diastolic (congestive) heart failure: Secondary | ICD-10-CM | POA: Diagnosis present

## 2021-05-03 DIAGNOSIS — I083 Combined rheumatic disorders of mitral, aortic and tricuspid valves: Secondary | ICD-10-CM | POA: Diagnosis present

## 2021-05-03 DIAGNOSIS — Z85038 Personal history of other malignant neoplasm of large intestine: Secondary | ICD-10-CM

## 2021-05-03 DIAGNOSIS — I4891 Unspecified atrial fibrillation: Secondary | ICD-10-CM | POA: Diagnosis present

## 2021-05-03 DIAGNOSIS — A419 Sepsis, unspecified organism: Principal | ICD-10-CM | POA: Diagnosis present

## 2021-05-03 DIAGNOSIS — N390 Urinary tract infection, site not specified: Secondary | ICD-10-CM | POA: Diagnosis present

## 2021-05-03 DIAGNOSIS — I509 Heart failure, unspecified: Secondary | ICD-10-CM

## 2021-05-03 DIAGNOSIS — Z79899 Other long term (current) drug therapy: Secondary | ICD-10-CM

## 2021-05-03 DIAGNOSIS — E870 Hyperosmolality and hypernatremia: Secondary | ICD-10-CM | POA: Diagnosis not present

## 2021-05-03 DIAGNOSIS — R109 Unspecified abdominal pain: Secondary | ICD-10-CM

## 2021-05-03 DIAGNOSIS — I82422 Acute embolism and thrombosis of left iliac vein: Secondary | ICD-10-CM | POA: Diagnosis present

## 2021-05-03 DIAGNOSIS — K219 Gastro-esophageal reflux disease without esophagitis: Secondary | ICD-10-CM | POA: Diagnosis present

## 2021-05-03 DIAGNOSIS — Z8249 Family history of ischemic heart disease and other diseases of the circulatory system: Secondary | ICD-10-CM

## 2021-05-03 DIAGNOSIS — Z6827 Body mass index (BMI) 27.0-27.9, adult: Secondary | ICD-10-CM

## 2021-05-03 DIAGNOSIS — I5040 Unspecified combined systolic (congestive) and diastolic (congestive) heart failure: Secondary | ICD-10-CM | POA: Diagnosis present

## 2021-05-03 DIAGNOSIS — Z8711 Personal history of peptic ulcer disease: Secondary | ICD-10-CM

## 2021-05-03 DIAGNOSIS — E876 Hypokalemia: Secondary | ICD-10-CM | POA: Diagnosis not present

## 2021-05-03 DIAGNOSIS — R54 Age-related physical debility: Secondary | ICD-10-CM | POA: Diagnosis present

## 2021-05-03 DIAGNOSIS — E872 Acidosis, unspecified: Secondary | ICD-10-CM | POA: Diagnosis present

## 2021-05-03 DIAGNOSIS — R64 Cachexia: Secondary | ICD-10-CM | POA: Diagnosis present

## 2021-05-03 DIAGNOSIS — Z95828 Presence of other vascular implants and grafts: Secondary | ICD-10-CM

## 2021-05-03 DIAGNOSIS — Z86718 Personal history of other venous thrombosis and embolism: Secondary | ICD-10-CM

## 2021-05-03 DIAGNOSIS — J189 Pneumonia, unspecified organism: Secondary | ICD-10-CM | POA: Diagnosis present

## 2021-05-03 DIAGNOSIS — R000 Tachycardia, unspecified: Secondary | ICD-10-CM

## 2021-05-03 DIAGNOSIS — R627 Adult failure to thrive: Secondary | ICD-10-CM | POA: Diagnosis present

## 2021-05-03 DIAGNOSIS — R5383 Other fatigue: Secondary | ICD-10-CM

## 2021-05-03 DIAGNOSIS — I959 Hypotension, unspecified: Secondary | ICD-10-CM | POA: Diagnosis not present

## 2021-05-03 DIAGNOSIS — R7989 Other specified abnormal findings of blood chemistry: Secondary | ICD-10-CM | POA: Diagnosis present

## 2021-05-03 DIAGNOSIS — I351 Nonrheumatic aortic (valve) insufficiency: Secondary | ICD-10-CM | POA: Diagnosis present

## 2021-05-03 DIAGNOSIS — R778 Other specified abnormalities of plasma proteins: Secondary | ICD-10-CM | POA: Diagnosis present

## 2021-05-03 DIAGNOSIS — F0390 Unspecified dementia without behavioral disturbance: Secondary | ICD-10-CM | POA: Diagnosis present

## 2021-05-03 DIAGNOSIS — Z66 Do not resuscitate: Secondary | ICD-10-CM | POA: Diagnosis not present

## 2021-05-03 DIAGNOSIS — R0902 Hypoxemia: Secondary | ICD-10-CM

## 2021-05-03 DIAGNOSIS — I82412 Acute embolism and thrombosis of left femoral vein: Secondary | ICD-10-CM | POA: Diagnosis present

## 2021-05-03 DIAGNOSIS — I5043 Acute on chronic combined systolic (congestive) and diastolic (congestive) heart failure: Secondary | ICD-10-CM | POA: Diagnosis present

## 2021-05-03 LAB — LIPASE, BLOOD: Lipase: 28 U/L (ref 11–51)

## 2021-05-03 LAB — COMPREHENSIVE METABOLIC PANEL
ALT: 16 U/L (ref 0–44)
AST: 27 U/L (ref 15–41)
Albumin: 2.7 g/dL — ABNORMAL LOW (ref 3.5–5.0)
Alkaline Phosphatase: 55 U/L (ref 38–126)
Anion gap: 11 (ref 5–15)
BUN: 25 mg/dL — ABNORMAL HIGH (ref 8–23)
CO2: 25 mmol/L (ref 22–32)
Calcium: 9.5 mg/dL (ref 8.9–10.3)
Chloride: 109 mmol/L (ref 98–111)
Creatinine, Ser: 0.91 mg/dL (ref 0.44–1.00)
GFR, Estimated: 58 mL/min — ABNORMAL LOW (ref >60)
Glucose, Bld: 103 mg/dL — ABNORMAL HIGH (ref 70–99)
Potassium: 3.8 mmol/L (ref 3.5–5.1)
Sodium: 145 mmol/L (ref 135–145)
Total Bilirubin: 1.1 mg/dL (ref 0.3–1.2)
Total Protein: 7.3 g/dL (ref 6.5–8.1)

## 2021-05-03 LAB — CBC WITH DIFFERENTIAL/PLATELET
Abs Immature Granulocytes: 0.1 10*3/uL — ABNORMAL HIGH (ref 0.00–0.07)
Basophils Absolute: 0 10*3/uL (ref 0.0–0.1)
Basophils Relative: 0 %
Eosinophils Absolute: 0 10*3/uL (ref 0.0–0.5)
Eosinophils Relative: 0 %
HCT: 37.9 % (ref 36.0–46.0)
Hemoglobin: 11.5 g/dL — ABNORMAL LOW (ref 12.0–15.0)
Immature Granulocytes: 1 %
Lymphocytes Relative: 9 %
Lymphs Abs: 0.8 10*3/uL (ref 0.7–4.0)
MCH: 31.1 pg (ref 26.0–34.0)
MCHC: 30.3 g/dL (ref 30.0–36.0)
MCV: 102.4 fL — ABNORMAL HIGH (ref 80.0–100.0)
Monocytes Absolute: 0.5 10*3/uL (ref 0.1–1.0)
Monocytes Relative: 6 %
Neutro Abs: 7.7 10*3/uL (ref 1.7–7.7)
Neutrophils Relative %: 84 %
Platelets: 183 10*3/uL (ref 150–400)
RBC: 3.7 MIL/uL — ABNORMAL LOW (ref 3.87–5.11)
RDW: 14.4 % (ref 11.5–15.5)
WBC: 9.2 10*3/uL (ref 4.0–10.5)
nRBC: 0.7 % — ABNORMAL HIGH (ref 0.0–0.2)

## 2021-05-03 LAB — RESP PANEL BY RT-PCR (FLU A&B, COVID) ARPGX2
Influenza A by PCR: NEGATIVE
Influenza B by PCR: NEGATIVE
SARS Coronavirus 2 by RT PCR: NEGATIVE

## 2021-05-03 LAB — BRAIN NATRIURETIC PEPTIDE: B Natriuretic Peptide: 2579.5 pg/mL — ABNORMAL HIGH (ref 0.0–100.0)

## 2021-05-03 LAB — LACTIC ACID, PLASMA: Lactic Acid, Venous: 1.8 mmol/L (ref 0.5–1.9)

## 2021-05-03 MED ORDER — SODIUM CHLORIDE 0.9 % IV BOLUS
1000.0000 mL | Freq: Once | INTRAVENOUS | Status: AC
  Administered 2021-05-03: 1000 mL via INTRAVENOUS

## 2021-05-03 NOTE — ED Triage Notes (Signed)
Per EMS, pt has been having weakness, lethargy, increased falls due to no appetite x 2 weeks. Pt lives at home with daughter. Pt c/o L knee pain/swelling and L shoulder pain. Scans have been done of l knee but no results yet/ Pt with hx of dementia.

## 2021-05-03 NOTE — ED Notes (Signed)
Daughter at bedside.

## 2021-05-03 NOTE — ED Provider Notes (Signed)
[place] DEPT Provider Note   CSN: [id number] Arrival date & time: 05/26/2021  2027     History No chief complaint on file.   Julia Robbins is a 87 y.o. female.  The history is provided by the patient, the EMS personnel and medical records. The history is limited by the condition of the patient. No language interpreter was used.  Dysuria Pain quality:  Burning Pain severity:  Moderate Onset quality:  Gradual Timing:  Constant Progression:  Unchanged Chronicity:  New Relieved by:  Nothing Worsened by:  Nothing Ineffective treatments:  None tried Urinary symptoms: discolored urine and foul-smelling urine   Associated symptoms: no abdominal pain, no fever, no flank pain, no nausea and no vomiting       Past Medical History:  Diagnosis Date   Abnormal EKG    hx left bundle branch block dating back to 2012 on old ekg's   Arthritis    degenerative.    CHF (congestive heart failure) ([place])    Cholelithiasis 2010   15 mm CBD on CT scan 2010.    Colon cancer ([place]) 1998   chemo and colectomy.  T3 N1 stage 3, 2/15 nodes +.    DVT (deep venous thrombosis) (HCC)    right leg   Elevated cholesterol    Family history of adverse reaction to anesthesia    daughter has post op n/v   Gastric ulcer 1998   [place] filter in place 1998   for DVT. right leg   HOH (hard of hearing)    slight    Patient Active Problem List   Diagnosis Date Noted   Hypotension 01/08/2021   Viral gastroenteritis 01/08/2021   Combined systolic and diastolic heart failure ([place]) 01/08/2021   AKI (acute kidney injury) ([place]) 01/07/2021   Symptomatic anemia    Acute combined systolic and diastolic heart failure (HCC)    Moderate aortic stenosis    Moderate aortic regurgitation    Acute lower GI bleeding 09/04/2019   Hematemesis 09/04/2019   Cholelithiasis 09/04/2019   Abnormal abdominal CT scan    RLQ abdominal pain    Acute blood loss anemia 12/02/2015   CKD  (chronic kidney disease) stage 3, GFR 30-59 ml/min (HCC) 12/02/2015   Gastrointestinal hemorrhage 12/01/2015   Acute kidney injury ([place]) 12/01/2015   Elevated lactic acid level 12/01/2015   Essential hypertension 12/01/2015   History of DVT (deep vein thrombosis) 10/31/2012   Hyperlipidemia 10/31/2012   History of colon cancer 03/29/2010    Past Surgical History:  Procedure Laterality Date   COLON SURGERY  1998   COLONOSCOPY WITH PROPOFOL N/A 04/12/2017   Procedure: COLONOSCOPY WITH PROPOFOL;  Surgeon: Milus Banister, MD;  Location: [facility] END[facility];  Service: Endoscopy;  Laterality: N/A;   DILATION AND CURETTAGE OF UTERUS  yrs ago   ESOPHAGOGASTRODUODENOSCOPY (EGD) WITH PROPOFOL N/A 03/08/2017   Procedure: ESOPHAGOGASTRODUODENOSCOPY (EGD) WITH PROPOFOL;  Surgeon: Milus Banister, MD;  Location: [facility] END[facility]COPY;  Service: Endoscopy;  Laterality: N/A;   ESOPHAGOGASTRODUODENOSCOPY (EGD) WITH PROPOFOL N/A 09/05/2019   Procedure: ESOPHAGOGASTRODUODENOSCOPY (EGD) WITH PROPOFOL;  Surgeon: Yetta Flock, MD;  Location: [facility] ENDOS[facility];  Service: Gastroenterology;  Laterality: N/A;     OB History   No obstetric history on file.     Family History  Problem Relation Age of Onset   Heart disease Mother    Colon cancer Neg Hx    Stomach cancer Neg Hx    Esophageal cancer Neg Hx  Pancreatic cancer Neg Hx    Liver disease Neg Hx     Social History   Tobacco Use   Smoking status: Never   Smokeless tobacco: Never  Vaping Use   Vaping Use: Never used  Substance Use Topics   Alcohol use: No   Drug use: No    Home Medications Prior to Admission medications   Medication Sig Start Date End Date Taking? Authorizing Provider  benzonatate (TESSALON) 200 MG capsule Take 1 capsule (200 mg total) by mouth 3 (three) times daily as needed for cough. 10/15/20   Freddi Starr, MD  Calcium Carbonate+Vitamin D 600-200 MG-UNIT TABS Take 1 tablet by mouth daily.    [provider]   ferrous sulfate 325 (65 FE) MG tablet Take 325 mg by mouth 2 (two) times daily with a meal.     [provider]  furosemide (LASIX) 40 MG tablet Take 1 tablet (40 mg total) by mouth daily as needed for fluid or edema. 01/10/21   Dahal, Marlowe Aschoff, MD  ipratropium-albuterol (DUONEB) 0.5-2.5 (3) MG/3ML SOLN Take 3 mLs by nebulization every 6 (six) hours as needed. Patient not taking: No sig reported 10/15/20   Freddi Starr, MD  nortriptyline (PAMELOR) 10 MG capsule Take 10 mg by mouth 2 (two) times daily.    [provider]  pantoprazole (PROTONIX) 40 MG tablet Take 1 tablet (40 mg total) by mouth 2 (two) times daily before a meal. 09/09/19 07/28/20  Rai, Ripudeep K, MD  potassium chloride SA (KLOR-CON) 20 MEQ tablet Take 20 mEq by mouth 2 (two) times daily.    [provider]  donepezil (ARICEPT) 10 MG tablet Take 10 mg by mouth at bedtime.  07/28/20  [provider]  metoprolol succinate (TOPROL-XL) 25 MG 24 hr tablet Take 1 tablet (25 mg total) by mouth daily. Patient not taking: No sig reported 09/09/19 07/28/20  Rai, Vernelle Emerald, MD  sucralfate (CARAFATE) 1 GM/10ML suspension Take 10 mLs (1 g total) by mouth 4 (four) times daily -  with meals and at bedtime. Patient not taking: No sig reported 09/09/19 07/28/20  Mendel Corning, MD    Allergies    Patient has no known allergies.  Review of Systems   Review of Systems  Constitutional:  Positive for chills and fatigue. Negative for diaphoresis and fever.  HENT:  Negative for congestion.   Respiratory:  Negative for cough, chest tightness, shortness of breath and wheezing.   Cardiovascular:  Negative for chest pain and palpitations.  Gastrointestinal:  Positive for diarrhea. Negative for abdominal pain, constipation, nausea and vomiting.  Genitourinary:  Positive for dysuria. Negative for flank pain and frequency.  Musculoskeletal:  Negative for back pain.  Neurological:  Positive for light-headedness.  Negative for dizziness, numbness and headaches.  Psychiatric/Behavioral:  Negative for agitation.   All other systems reviewed and are negative.  Physical Exam Updated Vital Signs BP 121/89   Pulse (!) 122   Temp (!) 97.2 F (36.2 C) (Oral)   Resp (!) 41   Ht '5\' 5"'$  (1.651 m)   Wt 75.8 kg   SpO2 97%   BMI 27.81 kg/m   Physical Exam Vitals and nursing note reviewed.  Constitutional:      General: She is not in acute distress.    Appearance: She is well-developed. She is not ill-appearing, toxic-appearing or diaphoretic.  HENT:     Head: Normocephalic and atraumatic.     Mouth/Throat:     Mouth: Mucous membranes  are dry.     Pharynx: No oropharyngeal exudate or posterior oropharyngeal erythema.  Eyes:     Extraocular Movements: Extraocular movements intact.     Conjunctiva/sclera: Conjunctivae normal.     Pupils: Pupils are equal, round, and reactive to light.  Cardiovascular:     Rate and Rhythm: Regular rhythm. Tachycardia present.     Heart sounds: No murmur heard. Pulmonary:     Effort: Pulmonary effort is normal. No respiratory distress.     Breath sounds: Rhonchi present. No wheezing or rales.  Chest:     Chest wall: No tenderness.  Abdominal:     General: Abdomen is flat.     Palpations: Abdomen is soft.     Tenderness: There is no abdominal tenderness. There is no right CVA tenderness, left CVA tenderness, guarding or rebound.  Musculoskeletal:        General: No tenderness.     Cervical back: Neck supple. No tenderness.     Right lower leg: No edema.     Left lower leg: No edema.  Skin:    General: Skin is warm and dry.     Findings: No erythema or rash.  Neurological:     General: No focal deficit present.     Mental Status: She is alert.  Psychiatric:        Mood and Affect: Mood normal.    ED Results / Procedures / Treatments   Labs (all labs ordered are listed, but only abnormal results are displayed) Labs Reviewed  CBC WITH  DIFFERENTIAL/PLATELET - Abnormal; Notable for the following components:      Result Value   RBC 3.70 (*)    Hemoglobin 11.5 (*)    MCV 102.4 (*)    nRBC 0.7 (*)    Abs Immature Granulocytes 0.10 (*)    All other components within normal limits  COMPREHENSIVE METABOLIC PANEL - Abnormal; Notable for the following components:   Glucose, Bld 103 (*)    BUN 25 (*)    Albumin 2.7 (*)    GFR, Estimated 58 (*)    All other components within normal limits  BRAIN NATRIURETIC PEPTIDE - Abnormal; Notable for the following components:   B Natriuretic Peptide 2,579.5 (*)    All other components within normal limits  RESP PANEL BY RT-PCR (FLU A&B, COVID) ARPGX2  URINE CULTURE  LACTIC ACID, PLASMA  LIPASE, BLOOD  LACTIC ACID, PLASMA  URINALYSIS, ROUTINE W REFLEX MICROSCOPIC  TSH    EKG EKG Interpretation  Date/Time:  Tuesday May 03 2021 20:53:51 EDT Ventricular Rate:  122 PR Interval:  117 QRS Duration: 134 QT Interval:  336 QTC Calculation: 479 R Axis:   -49 Text Interpretation: Sinus tachycardia Atrial premature complex Left bundle branch block When compared to prior, similar appearance. No STEMI Confirmed by Antony Blackbird [phone]) on 05/24/2021 9:04:59 PM  Radiology DG Chest Portable 1 View  Result Date: 05/17/2021 CLINICAL DATA:  Hypoxia, chills, dysuria, lethargy EXAM: PORTABLE CHEST 1 VIEW COMPARISON:  07/27/2020 FINDINGS: Single frontal view of the chest demonstrates an enlarged cardiac silhouette. There is central vascular congestion, with bilateral ground-glass airspace disease and trace effusions, most consistent with congestive heart failure. Superimposed infection would be difficult to exclude. No pneumothorax. No acute bony abnormalities. IMPRESSION: 1. Constellation of findings most consistent with congestive heart failure. Electronically Signed   By: Randa Ngo M.D.   On: 05/29/2021 21:30    Procedures Procedures   Medications Ordered in ED Medications  sodium  chloride  0.9 % bolus 1,000 mL (1,000 mLs Intravenous New Bag/Given 05/13/2021 2117)    ED Course  I have reviewed the triage vital signs and the nursing notes.  Pertinent labs & imaging results that were available during my care of the patient were reviewed by me and considered in my medical decision making (see chart for details).    MDM Rules/Calculators/A&P                           YOSELYNN VENERABLE is a 87 y.o. female with a past medical history significant for CKD, colon cancer, DVT with IVC filter, prior GI bleeding, CHF, gallstones, and hypertension who presents for lightheadedness, fatigue, chills, report of falls, and dysuria.  According to patient, she has had fatigue for the last week and has had foul-smelling and darkened urine.  She reports it is burning when she urinates.  She denies abdominal pain or chest pain or shortness of breath however her oxygen saturations were approximately 88% on room air so she was placed on oxygen.  She was tachycardic and tachypneic on arrival.  She was afebrile however.    Her legs were nontender nonedematous on exam.  Her lungs did not have rales but did have some rhonchi.  She did have dry mucous membranes and legs do not appear fluid overloaded.  Abdomen was nontender.  Bowel sounds were appreciated.  Other than feeling fatigued and having the urinary symptoms, patient had no focal pain reported.  She is not hurting anywhere after the falls and is denying any headache or neck pain or neck stiffness.  She denies vision changes or any neurologic complaints.  Clinically I am concerned she has UTI causing the symptoms however with the new hypoxia, will make sure she does not have pneumonia as well.  We will get a COVID and flu swab.  We will get urinalysis.  We will give her some fluids as she does not appear fluid overloaded and is tachycardic.  We will get a chest x-ray and EKG.  Anticipate reassessment after work-up to determine disposition.  Given her  new hypoxia, anticipate admission.  Care transferred to oncoming team awaiting for results of diagnostic work-up.  Due to her tachycardia, tachypnea, and hypoxia, I do anticipate she will require admission   Final Clinical Impression(s) / ED Diagnoses Final diagnoses:  Dysuria  Fatigue, unspecified type  Tachycardia     Clinical Impression: 1. Dysuria   2. Fatigue, unspecified type   3. Tachycardia     Disposition: Care transferred to oncoming team awaiting for results of work-up.  This note was prepared with assistance of Systems analyst. Occasional wrong-word or sound-a-like substitutions may have occurred due to the inherent limitations of voice recognition software.     Niccole Witthuhn, Gwenyth Allegra, MD 05/04/21 [phone]

## 2021-05-03 NOTE — ED Notes (Signed)
Purewick placed on pt. Daughter updated on pt's tests that have been done.

## 2021-05-04 ENCOUNTER — Emergency Department (HOSPITAL_COMMUNITY): Payer: Medicare Other

## 2021-05-04 DIAGNOSIS — A419 Sepsis, unspecified organism: Principal | ICD-10-CM

## 2021-05-04 DIAGNOSIS — I083 Combined rheumatic disorders of mitral, aortic and tricuspid valves: Secondary | ICD-10-CM | POA: Diagnosis present

## 2021-05-04 DIAGNOSIS — R64 Cachexia: Secondary | ICD-10-CM | POA: Diagnosis present

## 2021-05-04 DIAGNOSIS — R6520 Severe sepsis without septic shock: Secondary | ICD-10-CM

## 2021-05-04 DIAGNOSIS — E872 Acidosis: Secondary | ICD-10-CM | POA: Diagnosis present

## 2021-05-04 DIAGNOSIS — N390 Urinary tract infection, site not specified: Secondary | ICD-10-CM | POA: Diagnosis present

## 2021-05-04 DIAGNOSIS — J9601 Acute respiratory failure with hypoxia: Secondary | ICD-10-CM | POA: Diagnosis not present

## 2021-05-04 DIAGNOSIS — Z7189 Other specified counseling: Secondary | ICD-10-CM | POA: Diagnosis not present

## 2021-05-04 DIAGNOSIS — G9341 Metabolic encephalopathy: Secondary | ICD-10-CM | POA: Diagnosis present

## 2021-05-04 DIAGNOSIS — E870 Hyperosmolality and hypernatremia: Secondary | ICD-10-CM | POA: Diagnosis not present

## 2021-05-04 DIAGNOSIS — F0390 Unspecified dementia without behavioral disturbance: Secondary | ICD-10-CM

## 2021-05-04 DIAGNOSIS — I248 Other forms of acute ischemic heart disease: Secondary | ICD-10-CM | POA: Diagnosis present

## 2021-05-04 DIAGNOSIS — Z66 Do not resuscitate: Secondary | ICD-10-CM | POA: Diagnosis not present

## 2021-05-04 DIAGNOSIS — Z95828 Presence of other vascular implants and grafts: Secondary | ICD-10-CM | POA: Diagnosis not present

## 2021-05-04 DIAGNOSIS — I77819 Aortic ectasia, unspecified site: Secondary | ICD-10-CM | POA: Diagnosis present

## 2021-05-04 DIAGNOSIS — I959 Hypotension, unspecified: Secondary | ICD-10-CM | POA: Diagnosis not present

## 2021-05-04 DIAGNOSIS — J189 Pneumonia, unspecified organism: Secondary | ICD-10-CM | POA: Diagnosis present

## 2021-05-04 DIAGNOSIS — I82412 Acute embolism and thrombosis of left femoral vein: Secondary | ICD-10-CM | POA: Diagnosis present

## 2021-05-04 DIAGNOSIS — N1831 Chronic kidney disease, stage 3a: Secondary | ICD-10-CM | POA: Diagnosis present

## 2021-05-04 DIAGNOSIS — I509 Heart failure, unspecified: Secondary | ICD-10-CM | POA: Diagnosis not present

## 2021-05-04 DIAGNOSIS — I5043 Acute on chronic combined systolic (congestive) and diastolic (congestive) heart failure: Secondary | ICD-10-CM | POA: Diagnosis present

## 2021-05-04 DIAGNOSIS — I82422 Acute embolism and thrombosis of left iliac vein: Secondary | ICD-10-CM | POA: Diagnosis present

## 2021-05-04 DIAGNOSIS — I130 Hypertensive heart and chronic kidney disease with heart failure and stage 1 through stage 4 chronic kidney disease, or unspecified chronic kidney disease: Secondary | ICD-10-CM | POA: Diagnosis present

## 2021-05-04 DIAGNOSIS — E440 Moderate protein-calorie malnutrition: Secondary | ICD-10-CM | POA: Diagnosis present

## 2021-05-04 DIAGNOSIS — Z515 Encounter for palliative care: Secondary | ICD-10-CM | POA: Diagnosis not present

## 2021-05-04 DIAGNOSIS — Z20822 Contact with and (suspected) exposure to covid-19: Secondary | ICD-10-CM | POA: Diagnosis present

## 2021-05-04 DIAGNOSIS — U071 COVID-19: Secondary | ICD-10-CM | POA: Diagnosis not present

## 2021-05-04 DIAGNOSIS — D696 Thrombocytopenia, unspecified: Secondary | ICD-10-CM | POA: Diagnosis present

## 2021-05-04 DIAGNOSIS — R300 Dysuria: Secondary | ICD-10-CM | POA: Diagnosis present

## 2021-05-04 DIAGNOSIS — R7989 Other specified abnormal findings of blood chemistry: Secondary | ICD-10-CM | POA: Diagnosis not present

## 2021-05-04 DIAGNOSIS — I5021 Acute systolic (congestive) heart failure: Secondary | ICD-10-CM | POA: Diagnosis not present

## 2021-05-04 LAB — PROCALCITONIN: Procalcitonin: <0.1 ng/mL

## 2021-05-04 LAB — DIC (DISSEMINATED INTRAVASCULAR COAGULATION)PANEL
D-Dimer, Quant: 18.39 ug/mL-FEU — ABNORMAL HIGH (ref 0.00–0.50)
Fibrinogen: 227 mg/dL (ref 210–475)
INR: 1.4 — ABNORMAL HIGH (ref 0.8–1.2)
Platelets: 132 10*3/uL — ABNORMAL LOW (ref 150–400)
Prothrombin Time: 16.8 seconds — ABNORMAL HIGH (ref 11.4–15.2)
Smear Review: NONE SEEN
aPTT: 35 seconds (ref 24–36)

## 2021-05-04 LAB — URINALYSIS, ROUTINE W REFLEX MICROSCOPIC
Bilirubin Urine: NEGATIVE
Glucose, UA: NEGATIVE mg/dL
Hgb urine dipstick: NEGATIVE
Ketones, ur: NEGATIVE mg/dL
Leukocytes,Ua: NEGATIVE
Nitrite: NEGATIVE
Protein, ur: 30 mg/dL — AB
Specific Gravity, Urine: 1.027 (ref 1.005–1.030)
pH: 5 (ref 5.0–8.0)

## 2021-05-04 LAB — MAGNESIUM: Magnesium: 2.1 mg/dL (ref 1.7–2.4)

## 2021-05-04 LAB — CBC
HCT: 43.2 % (ref 36.0–46.0)
Hemoglobin: 12.5 g/dL (ref 12.0–15.0)
MCH: 31.2 pg (ref 26.0–34.0)
MCHC: 28.9 g/dL — ABNORMAL LOW (ref 30.0–36.0)
MCV: 107.7 fL — ABNORMAL HIGH (ref 80.0–100.0)
Platelets: 133 10*3/uL — ABNORMAL LOW (ref 150–400)
RBC: 4.01 MIL/uL (ref 3.87–5.11)
RDW: 14.9 % (ref 11.5–15.5)
WBC: 9.1 10*3/uL (ref 4.0–10.5)
nRBC: 0.8 % — ABNORMAL HIGH (ref 0.0–0.2)

## 2021-05-04 LAB — MRSA NEXT GEN BY PCR, NASAL: MRSA by PCR Next Gen: NOT DETECTED

## 2021-05-04 LAB — TROPONIN I (HIGH SENSITIVITY): Troponin I (High Sensitivity): 223 ng/L (ref <18)

## 2021-05-04 LAB — LACTIC ACID, PLASMA
Lactic Acid, Venous: 3.2 mmol/L (ref 0.5–1.9)
Lactic Acid, Venous: 3.3 mmol/L (ref 0.5–1.9)
Lactic Acid, Venous: 3.7 mmol/L (ref 0.5–1.9)

## 2021-05-04 LAB — D-DIMER, QUANTITATIVE: D-Dimer, Quant: 17.28 ug/mL-FEU — ABNORMAL HIGH (ref 0.00–0.50)

## 2021-05-04 LAB — CREATININE, SERUM
Creatinine, Ser: 0.89 mg/dL (ref 0.44–1.00)
GFR, Estimated: 59 mL/min — ABNORMAL LOW (ref >60)

## 2021-05-04 LAB — TSH: TSH: 1.521 u[IU]/mL (ref 0.350–4.500)

## 2021-05-04 LAB — STREP PNEUMONIAE URINARY ANTIGEN: Strep Pneumo Urinary Antigen: NEGATIVE

## 2021-05-04 MED ORDER — ACETAMINOPHEN 325 MG PO TABS
650.0000 mg | ORAL_TABLET | Freq: Once | ORAL | Status: AC
  Administered 2021-05-04: 650 mg via ORAL
  Filled 2021-05-04: qty 2

## 2021-05-04 MED ORDER — HYDROCODONE-ACETAMINOPHEN 5-325 MG PO TABS: 1.0000 | ORAL_TABLET | ORAL | Status: DC | PRN

## 2021-05-04 MED ORDER — SODIUM CHLORIDE 0.9 % IV SOLN
500.0000 mg | Freq: Once | INTRAVENOUS | Status: AC
  Administered 2021-05-04: 500 mg via INTRAVENOUS
  Filled 2021-05-04: qty 500

## 2021-05-04 MED ORDER — FUROSEMIDE 10 MG/ML IJ SOLN
40.0000 mg | Freq: Once | INTRAMUSCULAR | Status: AC
  Administered 2021-05-04: 40 mg via INTRAVENOUS
  Filled 2021-05-04: qty 4

## 2021-05-04 MED ORDER — LACTATED RINGERS IV SOLN: Freq: Once | INTRAVENOUS | Status: AC

## 2021-05-04 MED ORDER — AZITHROMYCIN 500 MG IV SOLR
500.0000 mg | INTRAVENOUS | Status: DC
Start: 2021-05-05 — End: 2021-05-05
  Administered 2021-05-05: 500 mg via INTRAVENOUS
  Filled 2021-05-04: qty 500

## 2021-05-04 MED ORDER — LORAZEPAM 2 MG/ML IJ SOLN
0.5000 mg | Freq: Four times a day (QID) | INTRAMUSCULAR | Status: DC | PRN
  Administered 2021-05-05: 0.5 mg via INTRAVENOUS
  Filled 2021-05-04: qty 1

## 2021-05-04 MED ORDER — ONDANSETRON HCL 4 MG/2ML IJ SOLN: 4.0000 mg | Freq: Four times a day (QID) | INTRAMUSCULAR | Status: DC | PRN

## 2021-05-04 MED ORDER — ONDANSETRON HCL 4 MG PO TABS: 4.0000 mg | ORAL_TABLET | Freq: Four times a day (QID) | ORAL | Status: DC | PRN

## 2021-05-04 MED ORDER — SODIUM CHLORIDE 0.9 % IV SOLN
2.0000 g | INTRAVENOUS | Status: DC
  Administered 2021-05-05: 2 g via INTRAVENOUS
  Filled 2021-05-04: qty 20

## 2021-05-04 MED ORDER — CHLORHEXIDINE GLUCONATE CLOTH 2 % EX PADS
6.0000 | MEDICATED_PAD | Freq: Every day | CUTANEOUS | Status: DC
  Administered 2021-05-04 – 2021-05-05 (×2): 6 via TOPICAL

## 2021-05-04 MED ORDER — IOHEXOL 350 MG/ML SOLN
100.0000 mL | Freq: Once | INTRAVENOUS | Status: AC | PRN
  Administered 2021-05-04: 80 mL via INTRAVENOUS

## 2021-05-04 MED ORDER — ACETAMINOPHEN 650 MG RE SUPP
650.0000 mg | Freq: Four times a day (QID) | RECTAL | Status: DC | PRN
Start: 2021-05-04 — End: 2021-05-05

## 2021-05-04 MED ORDER — POTASSIUM CHLORIDE CRYS ER 20 MEQ PO TBCR: 20.0000 meq | EXTENDED_RELEASE_TABLET | Freq: Every day | ORAL | Status: DC

## 2021-05-04 MED ORDER — FUROSEMIDE 10 MG/ML IJ SOLN
60.0000 mg | Freq: Once | INTRAMUSCULAR | Status: AC
  Administered 2021-05-04: 60 mg via INTRAVENOUS
  Filled 2021-05-04: qty 8

## 2021-05-04 MED ORDER — SODIUM CHLORIDE 0.9 % IV SOLN
1.0000 g | Freq: Once | INTRAVENOUS | Status: AC
  Administered 2021-05-04: 1 g via INTRAVENOUS
  Filled 2021-05-04: qty 10

## 2021-05-04 MED ORDER — ORAL CARE MOUTH RINSE
15.0000 mL | Freq: Two times a day (BID) | OROMUCOSAL | Status: DC
  Administered 2021-05-04 (×2): 15 mL via OROMUCOSAL

## 2021-05-04 MED ORDER — ACETAMINOPHEN 325 MG PO TABS
650.0000 mg | ORAL_TABLET | Freq: Four times a day (QID) | ORAL | Status: DC | PRN
  Administered 2021-05-05: 650 mg via ORAL
  Filled 2021-05-04: qty 2

## 2021-05-04 MED ORDER — FUROSEMIDE 10 MG/ML IJ SOLN: 40.0000 mg | Freq: Every day | INTRAMUSCULAR | Status: DC

## 2021-05-04 MED ORDER — GUAIFENESIN ER 600 MG PO TB12
600.0000 mg | ORAL_TABLET | Freq: Two times a day (BID) | ORAL | Status: DC
  Administered 2021-05-04 – 2021-05-05 (×2): 600 mg via ORAL
  Filled 2021-05-04 (×3): qty 1

## 2021-05-04 MED ORDER — IPRATROPIUM-ALBUTEROL 0.5-2.5 (3) MG/3ML IN SOLN: 3.0000 mL | Freq: Four times a day (QID) | RESPIRATORY_TRACT | Status: DC | PRN

## 2021-05-04 MED ORDER — ENOXAPARIN SODIUM 40 MG/0.4ML IJ SOSY
40.0000 mg | PREFILLED_SYRINGE | INTRAMUSCULAR | Status: DC
  Administered 2021-05-04: 40 mg via SUBCUTANEOUS
  Filled 2021-05-04: qty 0.4

## 2021-05-04 MED ORDER — LACTATED RINGERS IV SOLN: Freq: Once | INTRAVENOUS | Status: DC

## 2021-05-04 NOTE — ED Notes (Signed)
Pt back from CT

## 2021-05-04 NOTE — Progress Notes (Signed)
Met with daughter at bedside, discussed mom's clinical updates  Decision not to pursue further BiPAP (pt didn't like it)  Will order a mechanical soft diet Palliative care consult  Cont tx with abx    Eliseo Gum MSN, AGACNP-BC [place] 05/04/2021, 6:00 PM

## 2021-05-04 NOTE — ED Notes (Signed)
Patient transported to CT 

## 2021-05-04 NOTE — Consult Note (Addendum)
NAME:  Julia Robbins, MRN:  [id number], DOB:  [birth year], LOS: 0 ADMISSION DATE:  05/29/2021, CONSULTATION DATE:  05/04/21 REFERRING MD:  Marylyn Ishihara -TRH, CHIEF COMPLAINT:  respiratory distress  History of Present Illness:  87 yo F PMH dementia, CKD3a, DVT, Afib, GERD who presented to ED 8/2 with lethargy, decreased PO intake, foul smelling urine. Pt lives with her daughter and daughter reports poor PO intake x 1 week, no nvd. Fatigue x 1 week as well as dark foul smelling urine. Patient was noted to be tachypneic and hypoxic (88% on RA) and ws started on .  Her UA was concerning for UTI.  While still in ED 8/3, Her tachypnea increased in the ED, prompting CTA chest which did not reveal PE but did show PNA, pulm edema, pleural effusion. Her BNP was elevated. She was started on rocephin and azithromycin and admitted to [facility] 8/3.   After admission 8/3, patient continued to have increased work of breathing and hypoxia. With accessory muscle use, her support was escalated from [facility] to BiPAP and PCCM was consulted in this setting.   Pertinent  Medical History  Dementia Combined systolic and diastolic HF  Afib  DVT  CKD 3a Gastric cancer  GERD Bronchitis  Significant Hospital Events: Including procedures, antibiotic start and stop dates in addition to other pertinent events   8/2 presented to ED with SOB, foul smelling urine.  8/3 admitted to [facility] for UTI and hypoxic respiratory failure-- PNA, pulm edema, pleural effusions. Escalating O2 needs, started on BiPAP PCCM consulted   Interim History / Subjective:  Started on BiPAP.  RR fluctuating between 23-44  Objective   Blood pressure (!) 134/95, pulse (!) 120, temperature 97.7 F (36.5 C), temperature source Axillary, resp. rate (!) 41, height '5\' 5"'$  (1.651 m), weight 66.8 kg, SpO2 100 %.        Intake/Output Summary (Last 24 hours) at 05/04/2021 1302 Last data filed at 05/04/2021 0653 Gross per 24 hour  Intake 2566.81 ml  Output 200 ml  Net  2366.81 ml   Filed Weights   05/02/2021 2059 05/04/21 0853  Weight: 75.8 kg 66.8 kg    Examination: General: frail appearing elderly F reclined in bed mild respiratory distress on BiPAP HENT: NCAT pink mm. Anicteric sclera. BiPAP in place with good seal.  Lungs: Symmetrical chest expansion. Shallow rapid respirations with intermittent trap recruitment. Bilateral Rhonchi. Bibasilar sounds are diminished  Cardiovascular: s1s2 cap refill brisk. 2+ peripheral pulses  Abdomen: soft ndnt + bowel sounds Extremities: no acute joint deformity. No pitting edema. No cyanosis or clubbing  Neuro: Lethargic. Following simple commands. Hard of hearing  GU: yellow urine   Resolved Hospital Problem list     Assessment & Plan:   Dementia (present on admission) -Possible component of acute encephalopathy with current severe sepsis P -holding home meds -delirium precautions  Acute hypoxic respiratory failure due to bilateral PNA, pulmonary edema, pleural effusion (present on admission) P -close monitoring on BiPAP as temporary measure to offer support. Do not think that prolonged BiPAP is a suitable option for the patient unless status improved (mentation, debility)  -NPO -ICU -continue abx -repeat lasix  -hope to transition to HFNC  -dont think there would be benefit to thora   Severe sepsis due to UTI, PNA, with organ dysfunction (present on admission) -COVID, Flu A/B neg  P -cont abx -ICU monitoring -strep pneumo urine ag  -RVP  -Ucx pending   Acute on chronic combined systolic and diastolic heart  failure (present on admission) -BNP 2579 on admit and CT with pulm edema, pleural effusions. Last echo was in 2020 LVEF 40% and grade 1 diastolic dysfunction - in this case where we have severe sepsis without shock + HF exacerbation, with worsening respiratory status with hypoxia, think that continuing diuresis has more benefit than ceasing diuresis  P -additional Lasix 8/3  afternoon  Lactic acidosis -reflective of worsening of above processes  P -cont to treat sepsis as above   CKD 3a (present on admit) P -trend UOP, renal indices   Dilated ascending aorta, 20m (present on admission) P -no specific f/u for dilated aorta based on age   Hx Afib (present on admission) Hx DVT (present on admission) P -holding home meds  Inadequate PO intake, FTT, protein calorie malnutrition (present on admission) -loss of appetite, albumin 2.7  P -unfortunately need to maintain NPO while on BiPAP   Thrombocytopenia -critical illness  P -trend   Goals of Care // DNR discussion -I (Shirlee LimerickCCM) spoke with pts daughter (who is a PA with great insight into mother's chronic conditions) regarding pts decline from respiratory standpoint and the risks/benefits of current BiPAP therapy. We further talked about the risks/benefits and my concerns of more aggressive respiratory measures like ETT.   -decision reached to update code status to DNR - Hope is to wean off of BiPAP as this is not a great fit for the patient, which daughter understands. -While very much wanting to treat the treatable, daughter's primary concern is that her mother never be in distress. If we are 1) unable to wean from BiPAP or 2) have increasing respiratory distress, will reach back out to daughter as this may be indicative of need for palliative interventions.  -cont GOC pending clinical course   Best Practice (right click and "Reselect all SmartList Selections" daily)   Diet/type: NPO DVT prophylaxis: LMWH GI prophylaxis: N/A Lines: N/A Foley:  N/A Code Status:  DNR Last date of multidisciplinary goals of care discussion [pending]  Labs   CBC: Recent Labs  Lab 05/23/2021 2057 05/04/21 1024  WBC 9.2 9.1  NEUTROABS 7.7  --   HGB 11.5* 12.5  HCT 37.9 43.2  MCV 102.4* 107.7*  PLT 183 133*    Basic Metabolic Panel: Recent Labs  Lab 05/10/2021 2057 05/04/21 1024  NA 145  --   K 3.8   --   CL 109  --   CO2 25  --   GLUCOSE 103*  --   BUN 25*  --   CREATININE 0.91 0.89  CALCIUM 9.5  --   MG  --  2.1   GFR: Estimated Creatinine Clearance: 33.3 mL/min (by C-G formula based on SCr of 0.89 mg/dL). Recent Labs  Lab 05/23/2021 2055 05/14/2021 2057 05/04/21 0602 05/04/21 1024  PROCALCITON  --   --  <0.10  --   WBC  --  9.2  --  9.1  LATICACIDVEN 1.8  --   --  3.2*    Liver Function Tests: Recent Labs  Lab 05/02/2021 2057  AST 27  ALT 16  ALKPHOS 55  BILITOT 1.1  PROT 7.3  ALBUMIN 2.7*   Recent Labs  Lab 05/25/2021 2057  LIPASE 28   No results for input(s): AMMONIA in the last 168 hours.  ABG    Component Value Date/Time   TCO2 26 12/01/2015 1020     Coagulation Profile: No results for input(s): INR, PROTIME in the last 168 hours.  Cardiac Enzymes: No results  for input(s): CKTOTAL, CKMB, CKMBINDEX, TROPONINI in the last 168 hours.  HbA1C: No results found for: HGBA1C  CBG: No results for input(s): GLUCAP in the last 168 hours.  Review of Systems:   Unable to obtain due to dementia and respiratory distress   Past Medical History:  She,  has a past medical history of Abnormal EKG, Arthritis, CHF (congestive heart failure) ([place]), Cholelithiasis (2010), Colon cancer ([place]) (1998), DVT (deep venous thrombosis) ([place]), Elevated cholesterol, Family history of adverse reaction to anesthesia, Gastric ulcer (1998), Greenfield filter in place (1998), and HOH (hard of hearing).   Surgical History:   Past Surgical History:  Procedure Laterality Date   COLON SURGERY  1998   COLONOSCOPY WITH PROPOFOL N/A 04/12/2017   Procedure: COLONOSCOPY WITH PROPOFOL;  Surgeon: Milus Banister, MD;  Location: [facility] ENDOSCOPY;  Service: Endoscopy;  Laterality: N/A;   DILATION AND CURETTAGE OF UTERUS  yrs ago   ESOPHAGOGASTRODUODENOSCOPY (EGD) WITH PROPOFOL N/A 03/08/2017   Procedure: ESOPHAGOGASTRODUODENOSCOPY (EGD) WITH PROPOFOL;  Surgeon: Milus Banister, MD;  Location: [facility]  ENDOSCOPY;  Service: Endoscopy;  Laterality: N/A;   ESOPHAGOGASTRODUODENOSCOPY (EGD) WITH PROPOFOL N/A 09/05/2019   Procedure: ESOPHAGOGASTRODUODENOSCOPY (EGD) WITH PROPOFOL;  Surgeon: Yetta Flock, MD;  Location: [facility] ENDOSCOPY;  Service: Gastroenterology;  Laterality: N/A;     Social History:   reports that she has never smoked. She has never used smokeless tobacco. She reports that she does not drink alcohol and does not use drugs.   Family History:  Her family history includes Heart disease in her mother. There is no history of Colon cancer, Stomach cancer, Esophageal cancer, Pancreatic cancer, or Liver disease.   Allergies Not on File   Home Medications  Prior to Admission medications   Medication Sig Start Date End Date Taking? Authorizing Provider  acetaminophen (TYLENOL) 325 MG tablet Take 325 mg by mouth every 6 (six) hours as needed for mild pain.   Yes [provider]  Calcium Carbonate+Vitamin D 600-200 MG-UNIT TABS Take 1 tablet by mouth daily.   Yes [provider]  ferrous sulfate 325 (65 FE) MG tablet Take 325 mg by mouth daily.   Yes [provider]  furosemide (LASIX) 40 MG tablet Take 1 tablet (40 mg total) by mouth daily as needed for fluid or edema. Patient taking differently: Take 80 mg by mouth daily as needed for fluid or edema. 01/10/21  Yes Dahal, Marlowe Aschoff, MD  guaifenesin (HUMIBID E) 400 MG TABS tablet Take 400 mg by mouth at bedtime as needed (cough).   Yes [provider]  memantine (NAMENDA) 10 MG tablet Take 10 mg by mouth at bedtime.   Yes [provider]  nortriptyline (PAMELOR) 10 MG capsule Take 10 mg by mouth at bedtime.   Yes [provider]  pantoprazole (PROTONIX) 40 MG tablet Take 1 tablet (40 mg total) by mouth 2 (two) times daily before a meal. 09/09/19 07/09/21 Yes Rai, Ripudeep K, MD  potassium chloride SA (KLOR-CON) 20 MEQ tablet Take 20 mEq by mouth 2 (two) times daily as needed (Take only  when taking Furosemide).   Yes [provider]  donepezil (ARICEPT) 10 MG tablet Take 10 mg by mouth at bedtime.  07/28/20  [provider]  metoprolol succinate (TOPROL-XL) 25 MG 24 hr tablet Take 1 tablet (25 mg total) by mouth daily. Patient not taking: No sig reported 09/09/19 07/28/20  Rai, Vernelle Emerald, MD  sucralfate (CARAFATE) 1 GM/10ML suspension Take 10 mLs (1 g total)  by mouth 4 (four) times daily -  with meals and at bedtime. Patient not taking: No sig reported 09/09/19 07/28/20  Mendel Corning, MD     Critical care time: 52 minutes     CRITICAL CARE Performed by: Cristal Generous   Total critical care time: 52 minutes  Critical care time was exclusive of separately billable procedures and treating other patients. Critical care was necessary to treat or prevent imminent or life-threatening deterioration.  Critical care was time spent personally by me on the following activities: development of treatment plan with patient and/or surrogate as well as nursing, discussions with consultants, evaluation of patient's response to treatment, examination of patient, obtaining history from patient or surrogate, ordering and performing treatments and interventions, ordering and review of laboratory studies, ordering and review of radiographic studies, pulse oximetry and re-evaluation of patient's condition.  Eliseo Gum MSN, AGACNP-BC [place] for pager  05/04/2021, 1:54 PM

## 2021-05-04 NOTE — H&P (Addendum)
History and Physical    Julia Robbins [id number] DOB: November 24, 1922 DOA: 05/05/2021  PCP: Merrilee Seashore, MD  Patient coming from: home  Chief Complaint: poor PO intake  HPI: Julia Robbins is a 87 y.o. female with medical history significant of combined HF, dementia, GIST, CKD3a. Presenting with decreased PO intake. Hx is per daughter as patient has dementia and is a poor historian. Her daughter reports that the patient has had poor PO intake for the last week. While attending a previously scheduled PCP visit, she informed her doctor. She has tried to encourage diet for this past week, but the patient has eaten very little and drinks less. She has not complained of any abdominal pain. No N/V/D has been witnessed. She simply reports that she is full after a bite or two. The patient has denied any other symptoms; however, her family reports that she doesn't report symptoms often even though she is having them. Her daughter became concerned and brought her to the ED for help. She denies any other aggravating or alleviating factors.   ED Course: CTA chest showed PNA w/ superimposed CHF. She was started on rocephin and azithro. She was given lasix. TRH was called for admission.   Review of Systems:  Unable to obtain d/t mentation.   PMHx Past Medical History:  Diagnosis Date   Abnormal EKG    hx left bundle branch block dating back to 2012 on old ekg's   Arthritis    degenerative.    CHF (congestive heart failure) ([place])    Cholelithiasis 2010   15 mm CBD on CT scan 2010.    Colon cancer ([place]) 1998   chemo and colectomy.  T3 N1 stage 3, 2/15 nodes +.    DVT (deep venous thrombosis) ([facility])    right leg   Elevated cholesterol    Family history of adverse reaction to anesthesia    daughter has post op n/v   Gastric ulcer 1998   Greenfield filter in place 1998   for DVT. right leg   HOH (hard of hearing)    slight    PSHx Past Surgical History:  Procedure Laterality Date    COLON SURGERY  1998   COLONOSCOPY WITH PROPOFOL N/A 04/12/2017   Procedure: COLONOSCOPY WITH PROPOFOL;  Surgeon: Milus Banister, MD;  [facility]: [facility];  Service: Endoscopy;  Laterality: N/A;   DILATION AND CURETTAGE OF UTERUS  yrs ago   ESOPHAGOGASTRODUODENOSCOPY (EGD) WITH PROPOFOL N/A 03/08/2017   Procedure: ESOPHAGOGASTRODUODENOSCOPY (EGD) WITH PROPOFOL;  Surgeon: Milus Banister, MD;  Location: [facility];  Service: Endoscopy;  Laterality: N/A;   ESOPHAGOGASTRODUODENOSCOPY (EGD) WITH PROPOFOL N/A 09/05/2019   Procedure: ESOPHAGOGASTRODUODENOSCOPY (EGD) WITH PROPOFOL;  Surgeon: Yetta Flock, MD;  Location[facility];  Service: Gastroenterology;  Laterality: N/A;    SocHx  reports that she has never smoked. She has never used smokeless tobacco. She reports that she does not drink alcohol and does not use drugs.  Not on File  FamHx Family History  Problem Relation Age of Onset   Heart disease Mother    Colon cancer Neg Hx    Stomach cancer Neg Hx    Esophageal cancer Neg Hx    Pancreatic cancer Neg Hx    Liver disease Neg Hx     Prior to Admission medications   Medication Sig Start Date End Date Taking? Authorizing Provider  benzonatate (TESSALON) 200 MG capsule Take 1 capsule (200 mg total) by mouth 3 (three) times daily  as needed for cough. 10/15/20   Freddi Starr, MD  Calcium Carbonate+Vitamin D 600-200 MG-UNIT TABS Take 1 tablet by mouth daily.    [provider]  ferrous sulfate 325 (65 FE) MG tablet Take 325 mg by mouth 2 (two) times daily with a meal.     [provider]  furosemide (LASIX) 40 MG tablet Take 1 tablet (40 mg total) by mouth daily as needed for fluid or edema. 01/10/21   Dahal, Marlowe Aschoff, MD  ipratropium-albuterol (DUONEB) 0.5-2.5 (3) MG/3ML SOLN Take 3 mLs by nebulization every 6 (six) hours as needed. Patient not taking: No sig reported 10/15/20   Freddi Starr, MD  nortriptyline (PAMELOR) 10 MG capsule Take 10 mg by  mouth 2 (two) times daily.    [provider]  pantoprazole (PROTONIX) 40 MG tablet Take 1 tablet (40 mg total) by mouth 2 (two) times daily before a meal. 09/09/19 07/28/20  Rai, Ripudeep K, MD  potassium chloride SA (KLOR-CON) 20 MEQ tablet Take 20 mEq by mouth 2 (two) times daily.    [provider]  donepezil (ARICEPT) 10 MG tablet Take 10 mg by mouth at bedtime.  07/28/20  [provider]  metoprolol succinate (TOPROL-XL) 25 MG 24 hr tablet Take 1 tablet (25 mg total) by mouth daily. Patient not taking: No sig reported 09/09/19 07/28/20  Rai, Vernelle Emerald, MD  sucralfate (CARAFATE) 1 GM/10ML suspension Take 10 mLs (1 g total) by mouth 4 (four) times daily -  with meals and at bedtime. Patient not taking: No sig reported 09/09/19 07/28/20  Mendel Corning, MD    Physical Exam: Vitals:   05/04/21 0458 05/04/21 0530 05/04/21 0600 05/04/21 0700  BP: (!) 127/94  107/86 110/89  Pulse: (!) 122 (!) 119 (!) 117   Resp: (!) 32 (!) 37 (!) 53   Temp:      TempSrc:      SpO2: 98% 92% 94%   Weight:      Height:        General: 87 y.o. female resting in bed in NAD Eyes: PERRL, normal sclera ENMT: Nares patent w/o discharge, orophaynx clear, dentition normal, ears w/o discharge/lesions/ulcers Neck: Supple, trachea midline Cardiovascular: tachy, +S1, S2, no m/g/r, equal pulses throughout Respiratory: course b/l, no wheeze, increased WOB on 4L [place] GI: BS+, NDNT, no masses noted, no organomegaly noted MSK: No e/c/c Neuro: A&O x name only, no focal deficits Psyc: Pleasantly confused, calm/cooperative  Labs on Admission: I have personally reviewed following labs and imaging studies  CBC: Recent Labs  Lab 05/30/2021 2057  WBC 9.2  NEUTROABS 7.7  HGB 11.5*  HCT 37.9  MCV 102.4*  PLT [id number]   Basic Metabolic Panel: Recent Labs  Lab 05/02/2021 2057  NA 145  K 3.8  CL 109  CO2 25  GLUCOSE 103*  BUN 25*  CREATININE 0.91  CALCIUM 9.5   GFR: Estimated Creatinine  Clearance: 36.8 mL/min (by C-G formula based on SCr of 0.91 mg/dL). Liver Function Tests: Recent Labs  Lab 05/20/2021 2057  AST 27  ALT 16  ALKPHOS 55  BILITOT 1.1  PROT 7.3  ALBUMIN 2.7*   Recent Labs  Lab 05/13/2021 2057  LIPASE 28   No results for input(s): AMMONIA in the last 168 hours. Coagulation Profile: No results for input(s): INR, PROTIME in the last 168 hours. Cardiac Enzymes: No results for input(s): CKTOTAL, CKMB, CKMBINDEX, TROPONINI in the last 168 hours. BNP (last 3 results) No results for input(s):  PROBNP in the last 8760 hours. HbA1C: No results for input(s): HGBA1C in the last 72 hours. CBG: No results for input(s): GLUCAP in the last 168 hours. Lipid Profile: No results for input(s): CHOL, HDL, LDLCALC, TRIG, CHOLHDL, LDLDIRECT in the last 72 hours. Thyroid Function Tests: No results for input(s): TSH, T4TOTAL, FREET4, T3FREE, THYROIDAB in the last 72 hours. Anemia Panel: No results for input(s): VITAMINB12, FOLATE, FERRITIN, TIBC, IRON, RETICCTPCT in the last 72 hours. Urine analysis:    Component Value Date/Time   COLORURINE AMBER (A) 05/04/2021 0100   APPEARANCEUR HAZY (A) 05/04/2021 0100   LABSPEC 1.027 05/04/2021 0100   PHURINE 5.0 05/04/2021 0100   GLUCOSEU NEGATIVE 05/04/2021 0100   HGBUR NEGATIVE 05/04/2021 0100   BILIRUBINUR NEGATIVE 05/04/2021 0100   KETONESUR NEGATIVE 05/04/2021 0100   PROTEINUR 30 (A) 05/04/2021 0100   UROBILINOGEN 0.2 10/04/2008 1330   NITRITE NEGATIVE 05/04/2021 0100   LEUKOCYTESUR NEGATIVE 05/04/2021 0100    Radiological Exams on Admission: CT Angio Chest PE W and/or Wo Contrast  Result Date: 05/04/2021 CLINICAL DATA:  Pulmonary embolism suspected. Lethargy and shoulder pain with hypoxia. Negative COVID test EXAM: CT ANGIOGRAPHY CHEST WITH CONTRAST TECHNIQUE: Multidetector CT imaging of the chest was performed using the standard protocol during bolus administration of intravenous contrast. Multiplanar CT image  reconstructions and MIPs were obtained to evaluate the vascular anatomy. CONTRAST:  80m OMNIPAQUE IOHEXOL 350 MG/ML SOLN COMPARISON:  07/28/2020 FINDINGS: Cardiovascular: Cardiomegaly without change. Small volume pericardial fluid. No opacification of the systemic arterial tree. There is aortic atherosclerosis and elongation without acute superimposed finding. Dilated ascending aorta measuring up to 41 mm diameter, unchanged and no specific follow-up is recommended based on patient age. Satisfactory opacification of the pulmonary arteries to at least the segmental level. No filling defect is seen. Motion is a limiting factor at the bases. Mediastinum/Nodes: Negative for adenopathy or mass. Lungs/Pleura: Patchy airspace opacity. There is diffuse interlobular septal and fissural thickening with small pleural effusions. Findings likely reflect both failure and pneumonia. There is likely a degree of chronic interstitial lung disease based on prior CTs. Upper Abdomen: No acute finding Musculoskeletal: Generalized thoracic spine degeneration with scoliosis. No acute finding. Review of the MIP images confirms the above findings. IMPRESSION: 1. Negative for pulmonary embolism. 2. Patchy bilateral airspace disease/pneumonia with probable superimposed CHF. Electronically Signed   By: JMonte FantasiaM.D.   On: 05/04/2021 04:40   DG Chest Portable 1 View  Result Date: 05/29/2021 CLINICAL DATA:  Hypoxia, chills, dysuria, lethargy EXAM: PORTABLE CHEST 1 VIEW COMPARISON:  07/27/2020 FINDINGS: Single frontal view of the chest demonstrates an enlarged cardiac silhouette. There is central vascular congestion, with bilateral ground-glass airspace disease and trace effusions, most consistent with congestive heart failure. Superimposed infection would be difficult to exclude. No pneumothorax. No acute bony abnormalities. IMPRESSION: 1. Constellation of findings most consistent with congestive heart failure. Electronically Signed    By: MRanda NgoM.D.   On: 05/07/2021 21:30    EKG: Independently reviewed. Sinus tachy, no st elevation  Assessment/Plan Multifocal PNA Acute on chronic combined CHF Acute respiratory failure w/ hypoxia secondary to above SIRS w/ elevated lactic acid     - admitted to inpt, SDU     - continue rocephin, zithro for now     - add guaifenesin, duo-nebs PRN     - check urine strep, legionella     - wean O2 as able     - per family, she just had an echo  2 weeks ago; let's get those records     - continue IV lasix     - I&O, daily weight     - EKG as above  UPDATE: Patient shallow breathing at rate of 40 - 50. Will place on Bipap for now. Check ABG. Lactic acid has increased to 3.2 from 1.8 at admission. At this point, we are obligated to treat the lactic acid. Will run LR. Hold lasix. I have consulted PCCM. They will see the patient. Appreciate assistance.   [id number]     - she is at baseline, watch nephrotoxins  Dementia     - unknown type     - just started on medication per family     - will resume when med rec updated     - delirium precautions  Moderate protein cal malnutrition     - check pre-albumin     - dietician consult   DVT prophylaxis: lovenox  Code Status: FULL  Family Communication: spoke with dtr by phone  Consults called: None   Status is: Inpatient  Remains inpatient appropriate because:Inpatient level of care appropriate due to severity of illness  Dispo: The patient is from: Home              Anticipated d/c is to: Home              Patient currently is not medically stable to d/c.   Difficult to place patient No  Time spent coordinating admission: 70 minutes  [facility]  If 7PM-7AM, please contact night-coverage [email or web address]  05/04/2021, 7:11 AM

## 2021-05-04 NOTE — ED Notes (Signed)
Pt brief checked. Pt dry at this time. Pure wick in place.

## 2021-05-04 NOTE — ED Provider Notes (Addendum)
Patient signed out pending urinalysis.  Will need admission for hypoxia.  Chest x-ray without pneumothorax or pneumonia.  It is indicative of some CHF and patient with an elevated BNP.  Urinalysis shows 0-5 white cells and many bacteria.  This is not wholly consistent with a UTI.  On my evaluation, patient is significantly tachypneic and tachycardic.  No known history of A. fib and EKG shows sinus tachycardia.  She is on nasal cannula for respiratory support.  Repeat temperature 99 rectally.  Unclear etiology for tachycardia.  We will scan her chest to rule out PE given hypoxia.  CT chest obtained and does not show any evidence of PE.  There does seem to be evidence of infection with superimposed CHF.  For this reason, patient was given Rocephin and azithromycin.  We will plan for admission to the hospitalist.  Hold off on diuresis at this time as patient peripherally appears volume down; however, will discuss with the hospitalist.  Given her respiratory status, she likely would benefit from diuresis.  5:03 AM Spoke to the patient's daughter.  Reports that she would not want to live on life support but does want CPR if needed.  I had a long discussion with her that if the patient required resuscitative efforts such as CPR, she would also need ventilatory and life support.  These things, especially in a 87 year old, are not compatible.  For now, she is full code but I encouraged the daughter to think about what her mother's wishes would be.  At this time the patient is confused and cannot provide any further information.  CRITICAL CARE Performed by: Merryl Hacker   Total critical care time: 40 minutes  Critical care time was exclusive of separately billable procedures and treating other patients.  Critical care was necessary to treat or prevent imminent or life-threatening deterioration.  Critical care was time spent personally by me on the following activities: development of treatment plan with  patient and/or surrogate as well as nursing, discussions with consultants, evaluation of patient's response to treatment, examination of patient, obtaining history from patient or surrogate, ordering and performing treatments and interventions, ordering and review of laboratory studies, ordering and review of radiographic studies, pulse oximetry and re-evaluation of patient's condition.    Physical Exam  BP (!) 137/106   Pulse (!) 118   Temp 99 F (37.2 C) (Rectal)   Resp (!) 50   Ht 1.651 m ('5\' 5"'$ )   Wt 75.8 kg   SpO2 98%   BMI 27.81 kg/m   Physical Exam  ED Course/Procedures     Procedures  MDM   Problem List Items Addressed This Visit   None Visit Diagnoses     Dysuria    -  Primary   Fatigue, unspecified type       Tachycardia       Hypoxia       Community acquired pneumonia, unspecified laterality       Relevant Medications   cefTRIAXone (ROCEPHIN) 1 g in sodium chloride 0.9 % 100 mL IVPB (Start on 05/04/2021  5:00 AM)   azithromycin (ZITHROMAX) 500 mg in sodium chloride 0.9 % 250 mL IVPB (Start on 05/04/2021  5:00 AM)   Acute on chronic congestive heart failure, unspecified heart failure type [facility])                Kielan Dreisbach, Barbette Hair, MD 05/04/21 [id number]    Merryl Hacker, MD 05/04/21 [id number]    Merryl Hacker, MD 05/04/21  0551  

## 2021-05-04 NOTE — ED Notes (Signed)
In/out cath done on pt for urine sample. Daughter went home to rest.

## 2021-05-04 NOTE — ED Notes (Signed)
Pt's O2 increased to 4L due to her sats still dropping on 2L. HOB elevated as well.

## 2021-05-04 NOTE — Progress Notes (Signed)
   hS trop 223 EKG - LBBBB which is old baseline No chest pain but she has dementia ECHO Pending  Plan  - monitor without heparin  - cycle trop per protocol - await echo     SIGNATURE    Dr. Brand Males, M.D., F.C.C.P,  Pulmonary and Critical Care Medicine Staff Physician, [place] Director - Interstitial Lung Disease  Program  Pulmonary [place] [facility] [place], [place], [address]  NPI Number:  NPI [id number]  Pager: [phone], If no answer  -> Check AMION or Try [place] Telephone (clinical office): [phone] Telephone (research): [phone]  6:14 PM 05/04/2021

## 2021-05-05 ENCOUNTER — Inpatient Hospital Stay (HOSPITAL_COMMUNITY): Payer: Medicare Other

## 2021-05-05 DIAGNOSIS — I5021 Acute systolic (congestive) heart failure: Secondary | ICD-10-CM

## 2021-05-05 DIAGNOSIS — E440 Moderate protein-calorie malnutrition: Secondary | ICD-10-CM | POA: Diagnosis not present

## 2021-05-05 DIAGNOSIS — E876 Hypokalemia: Secondary | ICD-10-CM

## 2021-05-05 DIAGNOSIS — R7989 Other specified abnormal findings of blood chemistry: Secondary | ICD-10-CM

## 2021-05-05 DIAGNOSIS — U071 COVID-19: Secondary | ICD-10-CM | POA: Diagnosis not present

## 2021-05-05 DIAGNOSIS — A419 Sepsis, unspecified organism: Secondary | ICD-10-CM | POA: Diagnosis not present

## 2021-05-05 DIAGNOSIS — E872 Acidosis: Secondary | ICD-10-CM

## 2021-05-05 DIAGNOSIS — Z85038 Personal history of other malignant neoplasm of large intestine: Secondary | ICD-10-CM

## 2021-05-05 DIAGNOSIS — Z515 Encounter for palliative care: Secondary | ICD-10-CM

## 2021-05-05 DIAGNOSIS — J189 Pneumonia, unspecified organism: Secondary | ICD-10-CM | POA: Diagnosis not present

## 2021-05-05 DIAGNOSIS — I5043 Acute on chronic combined systolic (congestive) and diastolic (congestive) heart failure: Secondary | ICD-10-CM

## 2021-05-05 DIAGNOSIS — Z86718 Personal history of other venous thrombosis and embolism: Secondary | ICD-10-CM

## 2021-05-05 DIAGNOSIS — I480 Paroxysmal atrial fibrillation: Secondary | ICD-10-CM

## 2021-05-05 DIAGNOSIS — Z7189 Other specified counseling: Secondary | ICD-10-CM | POA: Diagnosis not present

## 2021-05-05 DIAGNOSIS — N1831 Chronic kidney disease, stage 3a: Secondary | ICD-10-CM

## 2021-05-05 DIAGNOSIS — J9601 Acute respiratory failure with hypoxia: Secondary | ICD-10-CM | POA: Diagnosis not present

## 2021-05-05 DIAGNOSIS — R000 Tachycardia, unspecified: Secondary | ICD-10-CM

## 2021-05-05 DIAGNOSIS — E870 Hyperosmolality and hypernatremia: Secondary | ICD-10-CM

## 2021-05-05 LAB — ECHOCARDIOGRAM LIMITED
AR max vel: 0.74 cm2
AV Area VTI: 0.75 cm2
AV Area mean vel: 0.81 cm2
AV Mean grad: 16 mmHg
AV Peak grad: 28.9 mmHg
Ao pk vel: 2.69 m/s
Height: 65 in
MV M vel: 5.06 m/s
MV Peak grad: 102.4 mmHg
Radius: 0.5 cm
Single Plane A4C EF: 22.2 %
Weight: 2296.31 oz

## 2021-05-05 LAB — COMPREHENSIVE METABOLIC PANEL
ALT: 17 U/L (ref 0–44)
AST: 27 U/L (ref 15–41)
Albumin: 2.6 g/dL — ABNORMAL LOW (ref 3.5–5.0)
Alkaline Phosphatase: 68 U/L (ref 38–126)
Anion gap: 9 (ref 5–15)
BUN: 26 mg/dL — ABNORMAL HIGH (ref 8–23)
CO2: 31 mmol/L (ref 22–32)
Calcium: 8.9 mg/dL (ref 8.9–10.3)
Chloride: 106 mmol/L (ref 98–111)
Creatinine, Ser: 1.06 mg/dL — ABNORMAL HIGH (ref 0.44–1.00)
GFR, Estimated: 48 mL/min — ABNORMAL LOW (ref >60)
Glucose, Bld: 69 mg/dL — ABNORMAL LOW (ref 70–99)
Potassium: 3.4 mmol/L — ABNORMAL LOW (ref 3.5–5.1)
Sodium: 146 mmol/L — ABNORMAL HIGH (ref 135–145)
Total Bilirubin: 0.9 mg/dL (ref 0.3–1.2)
Total Protein: 7 g/dL (ref 6.5–8.1)

## 2021-05-05 LAB — MAGNESIUM: Magnesium: 2.1 mg/dL (ref 1.7–2.4)

## 2021-05-05 LAB — GLUCOSE, CAPILLARY
Glucose-Capillary: 74 mg/dL (ref 70–99)
Glucose-Capillary: 110 mg/dL — ABNORMAL HIGH (ref 70–99)

## 2021-05-05 LAB — CBC
HCT: 38.3 % (ref 36.0–46.0)
HCT: 37.8 % (ref 36.0–46.0)
Hemoglobin: 11.5 g/dL — ABNORMAL LOW (ref 12.0–15.0)
Hemoglobin: 11.3 g/dL — ABNORMAL LOW (ref 12.0–15.0)
MCH: 30.7 pg (ref 26.0–34.0)
MCH: 31.4 pg (ref 26.0–34.0)
MCHC: 30 g/dL (ref 30.0–36.0)
MCHC: 29.9 g/dL — ABNORMAL LOW (ref 30.0–36.0)
MCV: 102.1 fL — ABNORMAL HIGH (ref 80.0–100.0)
MCV: 105 fL — ABNORMAL HIGH (ref 80.0–100.0)
Platelets: 166 10*3/uL (ref 150–400)
Platelets: 189 10*3/uL (ref 150–400)
RBC: 3.75 MIL/uL — ABNORMAL LOW (ref 3.87–5.11)
RBC: 3.6 MIL/uL — ABNORMAL LOW (ref 3.87–5.11)
RDW: 14.8 % (ref 11.5–15.5)
RDW: 14.6 % (ref 11.5–15.5)
WBC: 9.9 10*3/uL (ref 4.0–10.5)
WBC: 10.9 10*3/uL — ABNORMAL HIGH (ref 4.0–10.5)
nRBC: 0.3 % — ABNORMAL HIGH (ref 0.0–0.2)
nRBC: 0.4 % — ABNORMAL HIGH (ref 0.0–0.2)

## 2021-05-05 LAB — TROPONIN I (HIGH SENSITIVITY)
Troponin I (High Sensitivity): 227 ng/L (ref <18)
Troponin I (High Sensitivity): 246 ng/L (ref <18)

## 2021-05-05 LAB — URINE CULTURE: Culture: <10000 — AB

## 2021-05-05 LAB — HEPARIN LEVEL (UNFRACTIONATED): Heparin Unfractionated: 0.43 IU/mL (ref 0.30–0.70)

## 2021-05-05 LAB — LACTIC ACID, PLASMA: Lactic Acid, Venous: 2.1 mmol/L (ref 0.5–1.9)

## 2021-05-05 MED ORDER — ENSURE ENLIVE PO LIQD: 237.0000 mL | ORAL | Status: DC

## 2021-05-05 MED ORDER — METOPROLOL TARTRATE 12.5 MG HALF TABLET
12.5000 mg | ORAL_TABLET | Freq: Two times a day (BID) | ORAL | Status: DC
  Administered 2021-05-05: 12.5 mg via ORAL
  Filled 2021-05-05: qty 1

## 2021-05-05 MED ORDER — METOPROLOL TARTRATE 5 MG/5ML IV SOLN
2.5000 mg | Freq: Four times a day (QID) | INTRAVENOUS | Status: DC
  Filled 2021-05-05: qty 5

## 2021-05-05 MED ORDER — GLYCOPYRROLATE 1 MG PO TABS: 1.0000 mg | ORAL_TABLET | ORAL | Status: DC | PRN

## 2021-05-05 MED ORDER — ONDANSETRON 4 MG PO TBDP: 4.0000 mg | ORAL_TABLET | Freq: Four times a day (QID) | ORAL | Status: DC | PRN

## 2021-05-05 MED ORDER — HYDROMORPHONE HCL 1 MG/ML IJ SOLN
0.5000 mg | INTRAMUSCULAR | Status: DC | PRN
  Administered 2021-05-05: 0.5 mg via INTRAVENOUS
  Filled 2021-05-05: qty 1

## 2021-05-05 MED ORDER — KCL IN DEXTROSE-NACL 20-5-0.45 MEQ/L-%-% IV SOLN
INTRAVENOUS | Status: DC
  Filled 2021-05-05: qty 1000

## 2021-05-05 MED ORDER — LORAZEPAM 2 MG/ML IJ SOLN: 1.0000 mg | INTRAMUSCULAR | Status: DC | PRN

## 2021-05-05 MED ORDER — HEPARIN BOLUS VIA INFUSION
3000.0000 [IU] | Freq: Once | INTRAVENOUS | Status: AC
  Administered 2021-05-05: 3000 [IU] via INTRAVENOUS
  Filled 2021-05-05: qty 3000

## 2021-05-05 MED ORDER — ADULT MULTIVITAMIN W/MINERALS CH
1.0000 | ORAL_TABLET | Freq: Every day | ORAL | Status: DC
  Administered 2021-05-05: 1 via ORAL
  Filled 2021-05-05: qty 1

## 2021-05-05 MED ORDER — MORPHINE SULFATE (PF) 2 MG/ML IV SOLN
1.0000 mg | Freq: Once | INTRAVENOUS | Status: AC
  Administered 2021-05-05: 1 mg via INTRAVENOUS
  Filled 2021-05-05: qty 1

## 2021-05-05 MED ORDER — POTASSIUM CHLORIDE CRYS ER 20 MEQ PO TBCR
20.0000 meq | EXTENDED_RELEASE_TABLET | ORAL | Status: AC
  Administered 2021-05-05 (×2): 20 meq via ORAL
  Filled 2021-05-05 (×2): qty 1

## 2021-05-05 MED ORDER — GLYCOPYRROLATE 0.2 MG/ML IJ SOLN: 0.2000 mg | INTRAMUSCULAR | Status: DC | PRN

## 2021-05-05 MED ORDER — SODIUM CHLORIDE 0.9 % IV SOLN
INTRAVENOUS | Status: DC | PRN
  Administered 2021-05-05: 500 mL via INTRAVENOUS

## 2021-05-05 MED ORDER — HALOPERIDOL 1 MG PO TABS: 0.5000 mg | ORAL_TABLET | ORAL | Status: DC | PRN

## 2021-05-05 MED ORDER — ONDANSETRON HCL 4 MG/2ML IJ SOLN: 4.0000 mg | Freq: Four times a day (QID) | INTRAMUSCULAR | Status: DC | PRN

## 2021-05-05 MED ORDER — LORAZEPAM 1 MG PO TABS: 1.0000 mg | ORAL_TABLET | ORAL | Status: DC | PRN

## 2021-05-05 MED ORDER — POTASSIUM CL IN DEXTROSE 5% 20 MEQ/L IV SOLN
20.0000 meq | INTRAVENOUS | Status: DC
  Administered 2021-05-05: 20 meq via INTRAVENOUS
  Filled 2021-05-05: qty 1000

## 2021-05-05 MED ORDER — LORAZEPAM 2 MG/ML PO CONC
1.0000 mg | ORAL | Status: DC | PRN
  Administered 2021-05-05: 1 mg via SUBLINGUAL
  Filled 2021-05-05 (×3): qty 0.5

## 2021-05-05 MED ORDER — HEPARIN (PORCINE) 25000 UT/250ML-% IV SOLN
800.0000 [IU]/h | INTRAVENOUS | Status: DC
  Administered 2021-05-05: 800 [IU]/h via INTRAVENOUS

## 2021-05-05 MED ORDER — ASPIRIN 81 MG PO CHEW
81.0000 mg | CHEWABLE_TABLET | Freq: Every day | ORAL | Status: DC
  Administered 2021-05-05: 81 mg via ORAL
  Filled 2021-05-05 (×2): qty 1

## 2021-05-05 MED ORDER — POLYVINYL ALCOHOL 1.4 % OP SOLN
1.0000 [drp] | Freq: Four times a day (QID) | OPHTHALMIC | Status: DC | PRN
  Filled 2021-05-05: qty 15

## 2021-05-05 MED ORDER — BOOST / RESOURCE BREEZE PO LIQD CUSTOM
1.0000 | Freq: Two times a day (BID) | ORAL | Status: DC
  Administered 2021-05-05: 1 via ORAL

## 2021-05-05 MED ORDER — HALOPERIDOL LACTATE 5 MG/ML IJ SOLN: 0.5000 mg | INTRAMUSCULAR | Status: DC | PRN

## 2021-05-05 MED ORDER — FUROSEMIDE 10 MG/ML IJ SOLN
20.0000 mg | Freq: Once | INTRAMUSCULAR | Status: AC
  Administered 2021-05-05: 20 mg via INTRAVENOUS
  Filled 2021-05-05: qty 2

## 2021-05-05 MED ORDER — HALOPERIDOL LACTATE 2 MG/ML PO CONC
0.5000 mg | ORAL | Status: DC | PRN
  Filled 2021-05-05: qty 0.3

## 2021-05-05 NOTE — Progress Notes (Signed)
Initial Nutrition Assessment  DOCUMENTATION CODES:   Non-severe (moderate) malnutrition in context of chronic illness  INTERVENTION:  - will order Boost Breeze BID, each supplement provides 250 kcal and 9 grams of protein. - will order Ensure Enlive once/day, each supplement provides 350 kcal and 20 grams of protein. -will order 1 tablet multivitamin with minerals/day.  - recommend SLP evaluation.    NUTRITION DIAGNOSIS:   Moderate Malnutrition related to chronic illness (dementia) as evidenced by mild fat depletion, mild muscle depletion.  GOAL:   Patient will meet greater than or equal to 90% of their needs  MONITOR:   PO intake, Supplement acceptance, Labs, Weight trends  REASON FOR ASSESSMENT:   Malnutrition Screening Tool, Consult Assessment of nutrition requirement/status  ASSESSMENT:   87 y.o. female with medical history of CHF, dementia, GIST, colon cancer, arthritis, and stage 3 CKD. She presented to the ED d/t decreased PO intake, per her daughter's report, x1-2 weeks. Daughter denied patient having N/V/D in the time PTA and reported that patient often indicates being full after 1-2 bites.  No intakes documented since admission. Patient noted to be a/o to self only. No family or visitors present at the time of visit. Able to talk with RN who does not think patient had a breakfast tray; she ordered her a lunch tray.   Patient laying in bed and ECHO being done. Patient denies abdominal pain or nausea. She accepted ginger ale and took a very long drink using a straw. She requested jello and ate ~25% of the cup with RD feeding her. No issues or coughing with swallowing these two items.   Weight today is 143 lb and PTA the most recently documented weight was on 4/8 when she weighed 167 lb. This indicates 24 lb weight loss (14% body weight) in the past 4 months; significant for time frame.  Per notes: - made DNR after CCM NP discussion with daughter 8/3 - multifocal  PNA - SIRS - hx of dementia   Labs reviewed; CBGs: 74 and 110 mg/dl, Na: 146 mmol/l, K: 3.4 mmol/l, BUN: 26 mg/dl, creatinine: 1.06 mg/dl, GFR: 48 ml/min. Medications reviewed; 40 mg IV lasix x1 dose 8/3, 20 mEq Klor-Con x2 doses 8/4. IVF; D5-20 mEq IV KCl @ 75 ml/hr (306 kcal/24 hrs).    NUTRITION - FOCUSED PHYSICAL EXAM:  Flowsheet Row Most Recent Value  Orbital Region Mild depletion  Upper Arm Region No depletion  Thoracic and Lumbar Region Unable to assess  Buccal Region Mild depletion  Temple Region Moderate depletion  Clavicle Bone Region Moderate depletion  Clavicle and Acromion Bone Region Mild depletion  Scapular Bone Region Unable to assess  Dorsal Hand No depletion  Patellar Region Mild depletion  Anterior Thigh Region No depletion  Posterior Calf Region Mild depletion  Edema (RD Assessment) Moderate  [BLE]  Hair Reviewed  Eyes Reviewed  Mouth Reviewed  [no teeth, no dentures in]  Skin Reviewed  Nails Reviewed       Diet Order:   Diet Order             DIET SOFT Room service appropriate? Yes; Fluid consistency: Thin  Diet effective now                   EDUCATION NEEDS:   No education needs have been identified at this time  Skin:  Skin Assessment: Skin Integrity Issues: Skin Integrity Issues:: Other (Comment) Other: MASD L groin and mid-coccyx  Last BM:  PTA/unknown  Height:  Ht Readings from Last 1 Encounters:  05/04/21 '5\' 5"'$  (1.651 m)    Weight:   Wt Readings from Last 1 Encounters:  05/05/21 65.1 kg     Estimated Nutritional Needs:  Kcal:  1200-1400 kcal Protein:  60-70 grams Fluid:  >/= 1.6 L/day     Jarome Matin, MS, RD, LDN, CNSC Inpatient Clinical Dietitian RD pager # available in AMION  After hours/weekend pager # available in [facility]

## 2021-05-05 NOTE — Progress Notes (Signed)
Patient continued to decline with rapid shallow breathing at deteriorating mental status.  She is very lethargic.  Barely wakes up and follows commands.  Soft blood pressures but did not tolerate IV fluid with worsening respiratory status.  She is already DNR/DNI.  She did not tolerate BiPAP yesterday.  Echo looks worse.  Has extensive acute DVT involving left iliac vein, left common femoral vein, left proximal profunda vein and left femoral vein, as well as age-indeterminate DVT left distal DVT.  I had extensive discussion with patient's daughter, Julia Robbins at bedside about patient's current situation and poor prognosis.  Julia Robbins is a PA with surgical ophthalmology background.  She is very understanding.  I have introduced end-of-life care with emphasis on her comfort and dignity.  She says "my mother is 51 years, and outlived many of her friends and family. The one thing I don't want to see is her suffering".  Julia Robbins says she has gone through this with her father, and not new to it.  We have agreed on starting comfort care.  We have agreed on using medications such as Ativan, IV Dilaudid... As needed for pain, anxiety and air hunger.  Depending on how Julia Robbins does overnight, we will discuss about residential hospice. Julia Robbins says she has an appointment with palliative care tomorrow, and she will be here between 12 and 1 PM.  Patient's RN notified.  End-of-life care initiated.

## 2021-05-05 NOTE — Progress Notes (Signed)
$'1mg'B$ /0.51m Ativan wasted with SAmbrose MantleRN witness

## 2021-05-05 NOTE — Progress Notes (Signed)
ANTICOAGULATION CONSULT NOTE - Initial Consult  Pharmacy Consult for Heparin Indication: chest pain/ACS  Not on File  Patient Measurements: Height: '5\' 5"'$  (165.1 cm) Weight: 65.1 kg (143 lb 8.3 oz) IBW/kg (Calculated) : 57 Heparin Dosing Weight: TBW  Vital Signs: Temp: 98.1 F (36.7 C) (08/03 2345) Temp Source: Axillary (08/03 2345) BP: 102/56 (08/04 0300) Pulse Rate: 102 (08/04 0300)  Labs: Recent Labs    05/18/2021 2057 05/04/21 1024 05/04/21 1355 05/04/21 1701 05/05/21 0110 05/05/21 0250  HGB 11.5* 12.5  --   --   --  11.5*  HCT 37.9 43.2  --   --   --  38.3  PLT 183 133* 132*  --   --  166  APTT  --   --  35  --   --   --   LABPROT  --   --  16.8*  --   --   --   INR  --   --  1.4*  --   --   --   CREATININE 0.91 0.89  --   --   --  1.06*  TROPONINIHS  --   --   --  223* 227*  --     Estimated Creatinine Clearance: 27.9 mL/min (A) (by C-G formula based on SCr of 1.06 mg/dL (H)).   Medical History: Past Medical History:  Diagnosis Date   Abnormal EKG    hx left bundle branch block dating back to 2012 on old ekg's   Arthritis    degenerative.    CHF (congestive heart failure) ([place])    Cholelithiasis 2010   15 mm CBD on CT scan 2010.    Colon cancer ([place]) 1998   chemo and colectomy.  T3 N1 stage 3, 2/15 nodes +.    DVT (deep venous thrombosis) ([facility])    right leg   Elevated cholesterol    Family history of adverse reaction to anesthesia    daughter has post op n/v   Gastric ulcer 1998   [place] filter in place 1998   for DVT. right leg   HOH (hard of hearing)    slight    Medications:  Infusions:   azithromycin     cefTRIAXone (ROCEPHIN)  IV      Assessment: 87 yo F with abdominal pain.  Troponin increasing.  Pharmacy consulted to start IV heparin for presumed NSTEMI. She is not on anticoagulation PTA, received DVT prophylactic dose of Lovenox '40mg'$  at 1105 on 8/3. Baseline CBC- Hg slightly low at 11.5, pltc WNL  Goal of Therapy:  Heparin  level 0.3-0.7 units/ml Monitor platelets by anticoagulation protocol: Yes   Plan:  D/C SQ Lovenox Heparin 3000 units IV bolus then infusion at 800 units/hr Check 8h heparin level Daily heparin level & CBC while on heparin  Netta Cedars PharmD 05/05/2021,3:49 AM

## 2021-05-05 NOTE — Progress Notes (Signed)
ANTICOAGULATION CONSULT - Brief note  Pharmacy Consult for Heparin Indication: chest pain/ACS and DVT  Not on File  Patient Measurements: Height: '5\' 5"'$  (165.1 cm) Weight: 65.1 kg (143 lb 8.3 oz) IBW/kg (Calculated) : 57 Heparin Dosing Weight: TBW   Medications:  Infusions:   azithromycin Stopped (05/05/21 0534)   cefTRIAXone (ROCEPHIN)  IV Stopped (05/05/21 0430)   dextrose 5 % with KCl 20 mEq / L 75 mL/hr at 05/05/21 1200   heparin 800 Units/hr (05/05/21 1200)    Assessment: 87 yo F admitted on 8/2 found to have elevated troponin.  Pharmacy consulted to start IV heparin for presumed NSTEMI. Baseline INR 1.4, APTT 35  Today, 05/05/2021:   Dopplers of LLE + DVT Heparin level 0.43, therapeutic on heparin at 800 units/hr. CBC:  Hgb remains low/stable at 11.3, Plt WNL No bleeding or IV complications reported.   Troponin 223, 227, 246 D-dimer 18.39   Goal of Therapy:  Heparin level 0.3-0.7 units/ml Monitor platelets by anticoagulation protocol: Yes   Plan:  Continue heparin IV infusion at 800 units/hr Heparin level in 8 hours to confirm Daily heparin level and CBC   Gretta Arab PharmD, BCPS Clinical Pharmacist [facility] main pharmacy [phone] 05/05/2021 3:01 PM

## 2021-05-05 NOTE — Progress Notes (Signed)
[facility] Progress Note Patient Name: Julia Robbins DOB: [birth year] MRN: [id number]   Date of Service  05/05/2021  HPI/Events of Note  Nursing reports that patient c/o 10/10 abdominal pain earlier. Patient is now sleeping and no longer c/o pain.   eICU Interventions  Plan: Lactic Acid at 5 AM. Portable abdominal film at 5 AM. CBC with platelets and BMP at 5 AM     Intervention Category Major Interventions: Other:  Kyndal Heringer Cornelia Copa 05/05/2021, 1:44 AM

## 2021-05-05 NOTE — Progress Notes (Signed)
Palliative:  I spoke with daughter, Olin Hauser, via telephone who is working today (she is a PA and in between surgeries and unable to talk currently). Olin Hauser shares that she works half day tomorrow and plans to be at the hospital tomorrow afternoon after work. I am not on service tomorrow and will ask one of my colleagues to check in for conversation with family tomorrow afternoon. Thank you for this consult.   No charge  Vinie Sill, NP Palliative Medicine Team Pager [phone] (Please see [email or web address] for schedule) Team Phone [phone]

## 2021-05-05 NOTE — Progress Notes (Signed)
PROGRESS NOTE  Julia Robbins [id number] DOB: 1923-01-22   PCP: Merrilee Seashore, MD  Patient is from: Home. Independent with ADLs. Uses calender for daily activity. Uses walker.   DOA: 05/12/2021 LOS: 1  Chief complaints:  Chief Complaint  Patient presents with   Weakness     Brief Narrative / Interim history: 87 year old F with PMH of dementia, combined CHF, LBBB, colon cancer s/p colectomy, chemo, CKD-3A, DVT and A. fib not on AC, and GERD presenting with lethargy, poor p.o. intake and foul-smelling urine, and admitted for acute respiratory failure with hypoxia due to multifocal pneumonia, acute on chronic combined CHF, and elevated troponin and possible UTI.  She was started on antibiotics and IV Lasix.  She became tachypneic with shallow breaths and PCCM consulted.  She was a started on BiPAP but did not tolerate.  Also had elevated troponin and started on heparin drip.  Palliative medicine consulted, and PCCM signed off.  Subjective: Seen and examined earlier this morning.  She had significant abdominal pain that had resolved spontaneously.  No complaint this morning but not a reliable historian.  She is awake.  She is oriented to self and "hospital".  She follows commands.  She responds no to pain or shortness of breath.  Objective: Vitals:   05/05/21 1000 05/05/21 1100 05/05/21 1200 05/05/21 1206  BP: (!) 88/70 108/60 (!) 69/52   Pulse:      Resp: (!) 50 (!) 36 (!) 45   Temp:    (!) 97.4 F (36.3 C)  TempSrc:    Oral  SpO2:      Weight:      Height:        Intake/Output Summary (Last 24 hours) at 05/05/2021 1337 Last data filed at 05/05/2021 1000 Gross per 24 hour  Intake 571.24 ml  Output 2200 ml  Net -1628.76 ml   Filed Weights   05/26/2021 2059 05/04/21 0853 05/05/21 0348  Weight: 75.8 kg 66.8 kg 65.1 kg    Examination:  GENERAL: No apparent distress.  Nontoxic. HEENT: MMM.  Vision and hearing grossly intact.  NECK: Supple.  Prominent JVD RESP: 97% on 5  L.  No IWOB.  Somewhat tachypneic.  Fair aeration bilaterally. CVS: Tachycardic to 110s.  Heart sounds normal.  ABD/GI/GU: BS+. Abd soft, NTND.  MSK/EXT:  Moves extremities. No apparent deformity. No edema.  SKIN: Skin changes from chronic venous insufficiency in both legs. NEURO: Awake.  Oriented to self and "hospital".  Follows command.  No apparent focal neuro deficit other than generalized weakness. PSYCH: Calm. Normal affect.   Procedures:  None  Microbiology summarized: [id number] and influenza PCR nonreactive. Urine culture with insignificant growth.  Assessment & Plan: Severe sepsis due to multifocal pneumonia: meets criteria with tachycardia, tachypnea, lactic acidosis and respiratory failure.  She is at risk for aspiration due to dementia and advanced age. -Continue IV ceftriaxone and IV azithromycin -Recheck procalcitonin. -Aspiration precautions -SLP eval  Acute respiratory failure with hypoxia due to bilateral pneumonia and acute on chronic CHF.  Patient is DNR/DNI.  She did not tolerate BiPAP.  Saturating well on 5 L by HFNC. -Treat pneumonia as above -Wean oxygen as able -Holding of IV Lasix due to hypotension   Acute on chronic combined systolic and diastolic heart failure: POA.  Last TTE in 2020 with LVEF of 40%, G1 DD.  BNP elevated to 2576.  Imaging with pulmonary vascular congestion/edema.  She has prominent JVD but no edema.  Soft blood pressure precludes further diuresis.  Unfortunately not a candidate for inotropes -Follow repeat TTE  Elevated troponin: Likely demand ischemia from the above.  Pattern not consistent with ACS. -On IV heparin likely for elevated D-dimer.  Sinus tachycardia -Low-dose metoprolol with parameters   Lactic acidosis: Likely due to #1 and #2.  Improving. -Continue trending  Dementia without behavioral disturbance: She is awake and oriented to self and "hospital".  Follows commands.  No distress or agitation. -Treat treatable  causes -Reorientation and delirium precautions. -Avoid or minimize sedating medications.  Hypernatremia: Na 146. -IV D5-KCl at 75 cc an hour.  CKD-3A/azotemia: Creatinine slightly up.  Likely due to hypotension and IV Lasix. Recent Labs    07/27/20 2313 01/07/21 1842 01/08/21 0520 01/09/21 1416 01/10/21 0556 05/16/2021 2057 05/04/21 1024 05/05/21 0250  BUN 12 69* 67* 37* 25* 25*  --  26*  CREATININE 1.40* 3.23* 2.49* 1.31* 1.07* 0.91 0.89 1.06*  -Continue monitoring  Hypokalemia: K3.4. -Replenish -Check magnesium  Elevated D-dimer: 18.  CTA chest negative for PE. -Continue IV heparin pending LE Doppler  Dilated ascending aorta, 52m (present on admission)   History of A. fib: Not on anticoagulation.  Seems to be in sinus tachycardia now. -P.o. metoprolol with parameters  Thrombocytopenia: Resolved.   Goal of care counseling: Appropriately DNR/DNI.  Pretty functional at baseline per daughter.  Not tolerating BiPAP either.  She is 89.  A lot of comorbidity as above.   -Palliative care consulted   Inadequate PO intake/FTT/moderate malnutrition Body mass index is 23.88 kg/m. Nutrition Problem: Moderate Malnutrition Etiology: chronic illness (dementia) Signs/Symptoms: mild fat depletion, mild muscle depletion Interventions: Boost Breeze, Ensure Enlive (each supplement provides 350kcal and 20 grams of protein), MVI   DVT prophylaxis:    On IV heparin.  Code Status: DNR/DNI Family Communication: Patient and/or RN.  Updated patient's daughter over the phone. Level of care: Stepdown Status is: Inpatient  Remains inpatient appropriate because:Hemodynamically unstable, Persistent severe electrolyte disturbances, Altered mental status, Ongoing diagnostic testing needed not appropriate for outpatient work up, IV treatments appropriate due to intensity of illness or inability to take PO, and Inpatient level of care appropriate due to severity of illness  Dispo: The patient is  from: Home              Anticipated d/c is to:  To be determined              Patient currently is not medically stable to d/c.   Difficult to place patient No       Consultants:  Critical care Palliative medicine   Sch Meds:  Scheduled Meds:  aspirin  81 mg Oral Daily   Chlorhexidine Gluconate Cloth  6 each Topical Daily   feeding supplement  1 Container Oral BID BM   feeding supplement  237 mL Oral Q24H   guaiFENesin  600 mg Oral BID   mouth rinse  15 mL Mouth Rinse BID   metoprolol tartrate  12.5 mg Oral BID   multivitamin with minerals  1 tablet Oral Daily   Continuous Infusions:  azithromycin Stopped (05/05/21 0534)   cefTRIAXone (ROCEPHIN)  IV Stopped (05/05/21 0430)   dextrose 5 % with KCl 20 mEq / L 75 mL/hr at 05/05/21 1000   heparin 800 Units/hr (05/05/21 1000)   PRN Meds:.acetaminophen **OR** acetaminophen, ipratropium-albuterol, LORazepam, ondansetron **OR** ondansetron (ZOFRAN) IV  Antimicrobials: Anti-infectives (From admission, onward)    Start     Dose/Rate Route Frequency Ordered Stop   05/05/21 0500  cefTRIAXone (ROCEPHIN) 2 g  in sodium chloride 0.9 % 100 mL IVPB        2 g 200 mL/hr over 30 Minutes Intravenous Every 24 hours 05/04/21 0857 05/10/21 0459   05/05/21 0500  azithromycin (ZITHROMAX) 500 mg in sodium chloride 0.9 % 250 mL IVPB        500 mg 250 mL/hr over 60 Minutes Intravenous Every 24 hours 05/04/21 0857 05/10/21 0459   05/04/21 0500  cefTRIAXone (ROCEPHIN) 1 g in sodium chloride 0.9 % 100 mL IVPB        1 g 200 mL/hr over 30 Minutes Intravenous  Once 05/04/21 0449 05/04/21 0603   05/04/21 0500  azithromycin (ZITHROMAX) 500 mg in sodium chloride 0.9 % 250 mL IVPB        500 mg 250 mL/hr over 60 Minutes Intravenous  Once 05/04/21 0449 05/04/21 0653        I have personally reviewed the following labs and images: CBC: Recent Labs  Lab 05/14/2021 2057 05/04/21 1024 05/04/21 1355 05/05/21 0250 05/05/21 0630  WBC 9.2 9.1  --  9.9  10.9*  NEUTROABS 7.7  --   --   --   --   HGB 11.5* 12.5  --  11.5* 11.3*  HCT 37.9 43.2  --  38.3 37.8  MCV 102.4* 107.7*  --  102.1* 105.0*  PLT 183 133* 132* 166 189   BMP &GFR Recent Labs  Lab 05/27/2021 2057 05/04/21 1024 05/05/21 0250  NA 145  --  146*  K 3.8  --  3.4*  CL 109  --  106  CO2 25  --  31  GLUCOSE 103*  --  69*  BUN 25*  --  26*  CREATININE 0.91 0.89 1.06*  CALCIUM 9.5  --  8.9  MG  --  2.1  --    Estimated Creatinine Clearance: 27.9 mL/min (A) (by C-G formula based on SCr of 1.06 mg/dL (H)). Liver & Pancreas: Recent Labs  Lab 05/13/2021 2057 05/05/21 0250  AST 27 27  ALT 16 17  ALKPHOS 55 68  BILITOT 1.1 0.9  PROT 7.3 7.0  ALBUMIN 2.7* 2.6*   Recent Labs  Lab 05/02/2021 2057  LIPASE 28   No results for input(s): AMMONIA in the last 168 hours. Diabetic: No results for input(s): HGBA1C in the last 72 hours. Recent Labs  Lab 05/05/21 0419 05/05/21 0928  GLUCAP 74 110*   Cardiac Enzymes: No results for input(s): CKTOTAL, CKMB, CKMBINDEX, TROPONINI in the last 168 hours. No results for input(s): PROBNP in the last 8760 hours. Coagulation Profile: Recent Labs  Lab 05/04/21 1355  INR 1.4*   Thyroid Function Tests: Recent Labs    05/04/21 1024  TSH 1.521   Lipid Profile: No results for input(s): CHOL, HDL, LDLCALC, TRIG, CHOLHDL, LDLDIRECT in the last 72 hours. Anemia Panel: No results for input(s): VITAMINB12, FOLATE, FERRITIN, TIBC, IRON, RETICCTPCT in the last 72 hours. Urine analysis:    Component Value Date/Time   COLORURINE AMBER (A) 05/04/2021 0100   APPEARANCEUR HAZY (A) 05/04/2021 0100   LABSPEC 1.027 05/04/2021 0100   PHURINE 5.0 05/04/2021 0100   GLUCOSEU NEGATIVE 05/04/2021 0100   HGBUR NEGATIVE 05/04/2021 0100   BILIRUBINUR NEGATIVE 05/04/2021 0100   KETONESUR NEGATIVE 05/04/2021 0100   PROTEINUR 30 (A) 05/04/2021 0100   UROBILINOGEN 0.2 10/04/2008 1330   NITRITE NEGATIVE 05/04/2021 0100   LEUKOCYTESUR NEGATIVE  05/04/2021 0100   Sepsis Labs: Invalid input(s): PROCALCITONIN, [place]  Microbiology: Recent Results (from the past 240  hour(s))  Resp Panel by RT-PCR (Flu A&B, Covid) Nasopharyngeal Swab     Status: None   Collection Time: 05/29/2021  8:59 PM   Specimen: Nasopharyngeal Swab; Nasopharyngeal(NP) swabs in vial transport medium  Result Value Ref Range Status   SARS Coronavirus 2 by RT PCR NEGATIVE NEGATIVE Final    Comment: (NOTE) SARS-CoV-2 target nucleic acids are NOT DETECTED.  The SARS-CoV-2 RNA is generally detectable in upper respiratory specimens during the acute phase of infection. The lowest concentration of SARS-CoV-2 viral copies this assay can detect is 138 copies/mL. A negative result does not preclude SARS-Cov-2 infection and should not be used as the sole basis for treatment or other patient management decisions. A negative result may occur with  improper specimen collection/handling, submission of specimen other than nasopharyngeal swab, presence of viral mutation(s) within the areas targeted by this assay, and inadequate number of viral copies(<138 copies/mL). A negative result must be combined with clinical observations, patient history, and epidemiological information. The expected result is Negative.  Fact Sheet for Patients:  EntrepreneurPulse.com.au  Fact Sheet for Healthcare Providers:  IncredibleEmployment.be  This test is no t yet approved or cleared by the [place] FDA and  has been authorized for detection and/or diagnosis of SARS-CoV-2 by FDA under an Emergency Use Authorization (EUA). This EUA will remain  in effect (meaning this test can be used) for the duration of the COVID-19 declaration under Section 564(b)(1) of the Act, 21 U.S.C.section 360bbb-3(b)(1), unless the authorization is terminated  or revoked sooner.       Influenza A by PCR NEGATIVE NEGATIVE Final   Influenza B by PCR NEGATIVE NEGATIVE  Final    Comment: (NOTE) The Xpert Xpress SARS-CoV-2/FLU/RSV plus assay is intended as an aid in the diagnosis of influenza from Nasopharyngeal swab specimens and should not be used as a sole basis for treatment. Nasal washings and aspirates are unacceptable for Xpert Xpress SARS-CoV-2/FLU/RSV testing.  Fact Sheet for Patients: EntrepreneurPulse.com.au  Fact Sheet for Healthcare Providers: IncredibleEmployment.be  This test is not yet approved or cleared by the [place] FDA and has been authorized for detection and/or diagnosis of SARS-CoV-2 by FDA under an Emergency Use Authorization (EUA). This EUA will remain in effect (meaning this test can be used) for the duration of the COVID-19 declaration under Section 564(b)(1) of the Act, 21 U.S.C. section 360bbb-3(b)(1), unless the authorization is terminated or revoked.  Performed at [facility], [place] [address], [place], [place] [address]   Urine Culture     Status: Abnormal   Collection Time: 05/04/21  1:00 AM   Specimen: Urine, Clean Catch  Result Value Ref Range Status   Specimen Description   Final    URINE, CLEAN CATCH Performed at [facility], [place] [address] [place], [place] [address]    Special Requests   Final    NONE Performed at [facility], [place] [address], [place], [place] [address]    Culture (A)  Final    <10,000 COLONIES/mL INSIGNIFICANT GROWTH Performed at [facility] [address] [place], [place] [address]    Report Status 05/05/2021 FINAL  Final  MRSA Next Gen by PCR, Nasal     Status: None   Collection Time: 05/04/21  8:53 AM   Specimen: Nasal Mucosa; Nasal Swab  Result Value Ref Range Status   MRSA by PCR Next Gen NOT DETECTED NOT DETECTED Final    Comment: (NOTE) The GeneXpert MRSA Assay (FDA approved  for NASAL specimens only), is one component of a comprehensive MRSA colonization  surveillance program. It is not intended to diagnose MRSA infection nor to guide or monitor treatment for MRSA infections. Test performance is not FDA approved in patients less than 73 years old. Performed at [facility], [place] [address] [place], [place] [address]     Radiology Studies: DG Abd Portable 1V  Result Date: 05/05/2021 CLINICAL DATA:  Abdominal pain. EXAM: PORTABLE ABDOMEN - 1 VIEW COMPARISON:  CT 01/07/2021. FINDINGS: Soft tissue structures are unremarkable. Surgical clips noted over the abdomen. IVC filter noted in stable position. No bowel distention. Degenerative changes scoliosis lumbar spine. IMPRESSION: No acute abnormality identified. Electronically Signed   By: Marcello Moores  Register   On: 05/05/2021 08:23      Julia Robbins T. [place]  If 7PM-7AM, please contact night-coverage [email or web address] 05/05/2021, 1:37 PM

## 2021-05-05 NOTE — Progress Notes (Signed)
Lower extremity venous bilateral study completed.  Preliminary results relayed to [place], RN. Attempted to page Cyndia Skeeters, MD x2.  See CV Proc for preliminary results report.   Darlin Coco, RDMS, RVT

## 2021-05-05 NOTE — Progress Notes (Signed)
[facility] Progress Note Patient Name: Julia Robbins DOB: Jul 05, 1923 MRN: [id number]   Date of Service  05/05/2021  HPI/Events of Note  Elevated Troponin = 223 --> 227. EKG with LBBB, therefore, can't evaluate for ischemia. Clinical picture c/w NSTEMI vs Demand Ischemia. BP = 134/65.  eICU Interventions  Plan: ASA 81 mg PO now and Q day. Heparin IV infusion per pharmacy consult. Metoprolol 2.5 mg IV now and Q 6 hours. Hold for SBP < 105 or HR < 60.     Intervention Category Major Interventions: Other:  Lysle Dingwall 05/05/2021, 3:39 AM

## 2021-05-05 NOTE — Progress Notes (Signed)
Echocardiogram 2D Echocardiogram has been performed.  Oneal Deputy Keenan Trefry 05/05/2021, 12:09 PM

## 2021-05-05 NOTE — Evaluation (Signed)
SLP Cancellation Note  Patient Details Name: JADORE PARSELLS MRN: [id number] DOB: 1922-11-18   Cancelled treatment:       Reason Eval/Treat Not Completed: Other (comment) (Pt currently with high RR = shallow rapid breathing - is not appropriate for po at this time.    Will continue efforts.)  Kathleen Lime, MS [facility] SLP Acute Rehab Services Office [phone] Pager [phone]  Macario Golds 05/05/2021, 6:39 PM

## 2021-05-05 NOTE — Progress Notes (Signed)
MD Cyndia Skeeters made aware of pt RR 40s-60s and pts increased WOB. Pt oxygen saturation 100% on 3L. Verbal order for 20 of lasix given to pt. Daughter Olin Hauser also contacted. RN will continue to monitor.

## 2021-05-06 DIAGNOSIS — I359 Nonrheumatic aortic valve disorder, unspecified: Secondary | ICD-10-CM | POA: Insufficient documentation

## 2021-05-06 DIAGNOSIS — E872 Acidosis, unspecified: Secondary | ICD-10-CM | POA: Diagnosis present

## 2021-05-06 DIAGNOSIS — J188 Other pneumonia, unspecified organism: Secondary | ICD-10-CM | POA: Diagnosis present

## 2021-05-06 DIAGNOSIS — R778 Other specified abnormalities of plasma proteins: Secondary | ICD-10-CM | POA: Diagnosis present

## 2021-05-06 DIAGNOSIS — Z515 Encounter for palliative care: Secondary | ICD-10-CM

## 2021-05-06 DIAGNOSIS — Z8673 Personal history of transient ischemic attack (TIA), and cerebral infarction without residual deficits: Secondary | ICD-10-CM | POA: Insufficient documentation

## 2021-05-06 DIAGNOSIS — J189 Pneumonia, unspecified organism: Secondary | ICD-10-CM | POA: Diagnosis present

## 2021-05-06 DIAGNOSIS — I82409 Acute embolism and thrombosis of unspecified deep veins of unspecified lower extremity: Secondary | ICD-10-CM | POA: Diagnosis present

## 2021-05-06 DIAGNOSIS — A419 Sepsis, unspecified organism: Secondary | ICD-10-CM | POA: Diagnosis present

## 2021-05-06 DIAGNOSIS — I509 Heart failure, unspecified: Secondary | ICD-10-CM | POA: Insufficient documentation

## 2021-05-06 DIAGNOSIS — E870 Hyperosmolality and hypernatremia: Secondary | ICD-10-CM | POA: Diagnosis not present

## 2021-05-06 DIAGNOSIS — I350 Nonrheumatic aortic (valve) stenosis: Secondary | ICD-10-CM

## 2021-05-06 DIAGNOSIS — G9341 Metabolic encephalopathy: Secondary | ICD-10-CM | POA: Diagnosis present

## 2021-05-06 DIAGNOSIS — Z66 Do not resuscitate: Secondary | ICD-10-CM | POA: Diagnosis present

## 2021-05-06 DIAGNOSIS — F0390 Unspecified dementia without behavioral disturbance: Secondary | ICD-10-CM

## 2021-05-06 MED ORDER — MORPHINE SULFATE (PF) 2 MG/ML IV SOLN
1.0000 mg | INTRAVENOUS | Status: DC | PRN
Start: 2021-05-06 — End: 2021-05-06

## 2021-06-02 NOTE — Progress Notes (Signed)
    OVERNIGHT PROGRESS REPORT   Notified by RN that patient has expired at [place]  Patient was comfort care/DNR  2 RN verified.  Daughter was available to RN at bedside and had arrived earlier after being notified of status change of this patient by the RN.   Gershon Cull MSNA MSN [facility]-AG Acute Care Nurse Practitioner [place]

## 2021-06-02 NOTE — Progress Notes (Signed)
Pt will be transported to the morgue wearing 2 gold rings on her left hand and a black bonnet on her head.  Pt daughter is aware.

## 2021-06-02 NOTE — Death Summary Note (Addendum)
DEATH SUMMARY   Patient Details  Name: Julia Robbins MRN: [id number] DOB: October 26, 1922  Admission/Discharge Information   Admit Date:  May 13, 2021  Date of Death: Date of Death: 2021-05-16  Time of Death: Time of Death: 0503  Length of Stay: 2  Referring Physician: Merrilee Seashore, MD   Reason(s) for Hospitalization  Weakness  Diagnoses  Preliminary cause of death: Severe sepsis ([place]) Secondary Diagnoses (including complications and co-morbidities):  Principal Problem:   Severe sepsis ([place]) Active Problems:   Acute combined systolic and diastolic heart failure (HCC)   Moderate aortic stenosis   Moderate aortic regurgitation   Combined systolic and diastolic heart failure ([place])   Acute respiratory failure with hypoxia (HCC)   Malnutrition of moderate degree   Dementia without behavioral disturbance (HCC)   Multifocal pneumonia   Elevated troponin   Lactic acidosis   Hypernatremia   Acute deep vein thrombosis (DVT) (HCC)   Severe aortic stenosis   Acute metabolic encephalopathy   End of life care   DNR (do not resuscitate)   [place] [facility] Course (including significant findings, care, treatment, and services provided and events leading to death)  Julia Robbins is a 87 y.o. year old female with PMH of dementia, combined CHF, LBBB, colon cancer s/p colectomy, chemo, CKD-3A, DVT and A. fib not on AC, and GERD presenting with lethargy, poor p.o. intake and foul-smelling urine, and admitted for acute respiratory failure with hypoxia due to multifocal pneumonia, acute on chronic combined CHF, and elevated troponin and possible UTI.  She was started on antibiotics and IV Lasix.  She became tachypneic with shallow breaths and PCCM consulted.  She was a started on BiPAP but did not tolerate.  Also had elevated troponin and started on heparin drip.  Palliative medicine consulted, and PCCM signed off.    The next day, Limited echo with LVEF of 30 to 35%, global hypokinesis,  indeterminate DD, RVSP of 48 mmHg, moderate to severe MVR, moderate to severe AVR and severe AVS.  Lower extremity venous Doppler with extensive acute DVT extending to external iliac vein. Patient continued to do poorly not tolerating IV fluids or diuretics due to respiratory distress from CHF and hypotension.  Also heart significant decline in mental status.  After goal of care discussion with patient's daughter, Olin Hauser at bedside, she was transitioned to full comfort care, and passed away the next morning at 5:03 AM.  Pertinent Labs and Studies  Significant Diagnostic Studies CT Angio Chest PE W and/or Wo Contrast  Result Date: 05/04/2021 CLINICAL DATA:  Pulmonary embolism suspected. Lethargy and shoulder pain with hypoxia. Negative COVID test EXAM: CT ANGIOGRAPHY CHEST WITH CONTRAST TECHNIQUE: Multidetector CT imaging of the chest was performed using the standard protocol during bolus administration of intravenous contrast. Multiplanar CT image reconstructions and MIPs were obtained to evaluate the vascular anatomy. CONTRAST:  15m OMNIPAQUE IOHEXOL 350 MG/ML SOLN COMPARISON:  07/28/2020 FINDINGS: Cardiovascular: Cardiomegaly without change. Small volume pericardial fluid. No opacification of the systemic arterial tree. There is aortic atherosclerosis and elongation without acute superimposed finding. Dilated ascending aorta measuring up to 41 mm diameter, unchanged and no specific follow-up is recommended based on patient age. Satisfactory opacification of the pulmonary arteries to at least the segmental level. No filling defect is seen. Motion is a limiting factor at the bases. Mediastinum/Nodes: Negative for adenopathy or mass. Lungs/Pleura: Patchy airspace opacity. There is diffuse interlobular septal and fissural thickening with small pleural effusions. Findings likely reflect both failure and pneumonia. There is  likely a degree of chronic interstitial lung disease based on prior CTs. Upper Abdomen: No  acute finding Musculoskeletal: Generalized thoracic spine degeneration with scoliosis. No acute finding. Review of the MIP images confirms the above findings. IMPRESSION: 1. Negative for pulmonary embolism. 2. Patchy bilateral airspace disease/pneumonia with probable superimposed CHF. Electronically Signed   By: Monte Fantasia M.D.   On: 05/04/2021 04:40   DG Chest Portable 1 View  Result Date: 05/28/2021 CLINICAL DATA:  Hypoxia, chills, dysuria, lethargy EXAM: PORTABLE CHEST 1 VIEW COMPARISON:  07/27/2020 FINDINGS: Single frontal view of the chest demonstrates an enlarged cardiac silhouette. There is central vascular congestion, with bilateral ground-glass airspace disease and trace effusions, most consistent with congestive heart failure. Superimposed infection would be difficult to exclude. No pneumothorax. No acute bony abnormalities. IMPRESSION: 1. Constellation of findings most consistent with congestive heart failure. Electronically Signed   By: Randa Ngo M.D.   On: 05/13/2021 21:30   DG Abd Portable 1V  Result Date: 05/05/2021 CLINICAL DATA:  Abdominal pain. EXAM: PORTABLE ABDOMEN - 1 VIEW COMPARISON:  CT 01/07/2021. FINDINGS: Soft tissue structures are unremarkable. Surgical clips noted over the abdomen. IVC filter noted in stable position. No bowel distention. Degenerative changes scoliosis lumbar spine. IMPRESSION: No acute abnormality identified. Electronically Signed   By: Marcello Moores  Register   On: 05/05/2021 08:23   VAS [place] LOWER EXTREMITY VENOUS (DVT)  Result Date: 05/05/2021  Lower Venous DVT Study Patient Name:  LINEA RASMUSSON  Date of Exam:   05/05/2021 Medical Rec #: [id number]        Accession #:    [id number] Date of Birth: November 19, 1922         Patient Gender: F Patient Age:   89Y Exam Location:  [facility] Procedure:      VAS [place] LOWER EXTREMITY VENOUS (DVT) Referring Phys: Bretta Bang Jayson Waterhouse --------------------------------------------------------------------------------  Indications:  Elevated d-dimer >18. Edema.  Comparison Study: No recent prior studies. Performing Technologist: Darlin Coco RDMS,RVT  Examination Guidelines: A complete evaluation includes B-mode imaging, spectral Doppler, color Doppler, and power Doppler as needed of all accessible portions of each vessel. Bilateral testing is considered an integral part of a complete examination. Limited examinations for reoccurring indications may be performed as noted. The reflux portion of the exam is performed with the patient in reverse Trendelenburg.  +---------+---------------+---------+-----------+----------+--------------+ RIGHT    CompressibilityPhasicitySpontaneityPropertiesThrombus Aging +---------+---------------+---------+-----------+----------+--------------+ CFV      Full           No       Yes                                 +---------+---------------+---------+-----------+----------+--------------+ SFJ      Full                                                        +---------+---------------+---------+-----------+----------+--------------+ FV Prox  Full                                                        +---------+---------------+---------+-----------+----------+--------------+ FV Mid   Full                                                        +---------+---------------+---------+-----------+----------+--------------+  FV DistalFull                                                        +---------+---------------+---------+-----------+----------+--------------+ PFV      Full                                                        +---------+---------------+---------+-----------+----------+--------------+ POP      Full           No       Yes                                 +---------+---------------+---------+-----------+----------+--------------+ PTV      Full                                                         +---------+---------------+---------+-----------+----------+--------------+ PERO     Full                                                        +---------+---------------+---------+-----------+----------+--------------+ Gastroc  Full                                                        +---------+---------------+---------+-----------+----------+--------------+   +---------+---------------+---------+-----------+----------+-----------------+ LEFT     CompressibilityPhasicitySpontaneityPropertiesThrombus Aging    +---------+---------------+---------+-----------+----------+-----------------+ CFV      None           No       No                   Acute             +---------+---------------+---------+-----------+----------+-----------------+ SFJ      None                                         Acute             +---------+---------------+---------+-----------+----------+-----------------+ FV Prox  None           No       No                   Acute             +---------+---------------+---------+-----------+----------+-----------------+ FV Mid   None           No       No                   Age Indeterminate +---------+---------------+---------+-----------+----------+-----------------+ FV DistalNone  No       No                   Age Indeterminate +---------+---------------+---------+-----------+----------+-----------------+ PFV      None           No       No                   Acute             +---------+---------------+---------+-----------+----------+-----------------+ POP      None           No       No                   Age Indeterminate +---------+---------------+---------+-----------+----------+-----------------+ PTV      Partial        Yes      Yes                  Age Indeterminate +---------+---------------+---------+-----------+----------+-----------------+ Gastroc  None                                         Age  Indeterminate +---------+---------------+---------+-----------+----------+-----------------+ EIV      None           No       No                   Acute             +---------+---------------+---------+-----------+----------+-----------------+     Summary: RIGHT: - There is no evidence of deep vein thrombosis in the lower extremity.  - No cystic structure found in the popliteal fossa.  LEFT: - Findings consistent with acute deep vein thrombosis involving the external iliac vein, left common femoral vein, SF junction, left proximal profunda vein, and left femoral vein. - Findings consistent with age indeterminate deep vein thrombosis involving the left popliteal vein, left posterior tibial veins, and left gastrocnemius veins.  - No cystic structure found in the popliteal fossa.  -Evidence of deep vein thrombosis extending above the inguinal ligament.  *See table(s) above for measurements and observations. Electronically signed by Servando Snare MD on 05/05/2021 at 3:45:47 PM.    Final    ECHOCARDIOGRAM LIMITED  Result Date: 05/05/2021    ECHOCARDIOGRAM LIMITED REPORT   Patient Name:   Julia Robbins Date of Exam: 05/05/2021 Medical Rec #:  [id number]       Height:       65.0 in Accession #:    [id number]      Weight:       143.5 lb Date of Birth:  07/23/1923        BSA:          1.718 m Patient Age:    82 years        BP:           108/60 mmHg Patient Gender: F               HR:           112 bpm. Exam Location:  Inpatient Procedure: Limited Echo, Color Doppler and Cardiac Doppler Indications:     [id number] Acute systolic (congestive) heart failure  History:         Patient has prior history of Echocardiogram examinations, most  recent 09/06/2019. CHF; Risk Factors:Hypertension and                  Dyslipidemia.  Sonographer:     Raquel Sarna Senior RDCS Referring Phys:  [id number] GRACE E BOWSER Diagnosing Phys: Loralie Champagne MD  Sonographer Comments: Performed sitting upright for patient comfort. IMPRESSIONS   1. Left ventricular ejection fraction, by estimation, is 30 to 35%. The left ventricle has moderately decreased function. The left ventricle demonstrates global hypokinesis septal-lateral dyssynchrony. Indeterminant diastolic function.  2. Right ventricular systolic function is mildly reduced. The right ventricular size is mildly enlarged. There is moderately elevated pulmonary artery systolic pressure. The estimated right ventricular systolic pressure is [id number] mmHg.  3. Left atrial size was mildly dilated.  4. The mitral valve is degenerative. Moderate to severe central mitral valve regurgitation. No evidence of mitral stenosis. Moderate mitral annular calcification.  5. The aortic valve is tricuspid. Aortic valve regurgitation is mild. Moderate to severe aortic valve stenosis (possible low gradient severe aortic stenosis). Aortic valve area, by VTI measures 0.75 cm. Aortic valve mean gradient measures 16.0 mmHg.  6. The inferior vena cava is dilated in size with <50% respiratory variability, suggesting right atrial pressure of 15 mmHg. FINDINGS  Left Ventricle: Left ventricular ejection fraction, by estimation, is 30 to 35%. The left ventricle has moderately decreased function. The left ventricle demonstrates global hypokinesis. The left ventricular internal cavity size was normal in size. There is no left ventricular hypertrophy. Left ventricular diastolic parameters are indeterminate. Right Ventricle: The right ventricular size is mildly enlarged. No increase in right ventricular wall thickness. Right ventricular systolic function is mildly reduced. There is moderately elevated pulmonary artery systolic pressure. The tricuspid regurgitant velocity is 2.87 m/s, and with an assumed right atrial pressure of 15 mmHg, the estimated right ventricular systolic pressure is [id number] mmHg. Left Atrium: Left atrial size was mildly dilated. Right Atrium: Right atrial size was normal in size. Mitral Valve: The mitral valve is  degenerative in appearance. There is mild thickening of the mitral valve leaflet(s). Moderate mitral annular calcification. Moderate to severe mitral valve regurgitation, with centrally-directed jet. No evidence of mitral valve stenosis. Tricuspid Valve: The tricuspid valve is normal in structure. Tricuspid valve regurgitation is mild. Aortic Valve: The aortic valve is tricuspid. Aortic valve regurgitation is mild. Moderate to severe aortic stenosis is present. Aortic valve mean gradient measures 16.0 mmHg. Aortic valve peak gradient measures 28.9 mmHg. Aortic valve area, by VTI measures 0.75 cm. Venous: The inferior vena cava is dilated in size with less than 50% respiratory variability, suggesting right atrial pressure of 15 mmHg. IAS/Shunts: No atrial level shunt detected by color flow Doppler. LEFT VENTRICLE PLAX 2D LVOT diam:     2.00 cm LV SV:         34 LV SV Index:   20 LVOT Area:     3.14 cm  LV Volumes (MOD) LV vol d, MOD A4C: 135.0 ml LV vol s, MOD A4C: 105.0 ml LV SV MOD A4C:     135.0 ml AORTIC VALVE AV Area (Vmax):    0.74 cm AV Area (Vmean):   0.81 cm AV Area (VTI):     0.75 cm AV Vmax:           269.00 cm/s AV Vmean:          187.000 cm/s AV VTI:            0.450 m AV Peak Grad:      28.9  mmHg AV Mean Grad:      16.0 mmHg LVOT Vmax:         63.30 cm/s LVOT Vmean:        48.100 cm/s LVOT VTI:          0.108 m LVOT/AV VTI ratio: 0.24 MR Peak grad:    102.4 mmHg  TRICUSPID VALVE MR Mean grad:    60.0 mmHg   TR Peak grad:   32.9 mmHg MR Vmax:         506.00 cm/s TR Vmax:        287.00 cm/s MR Vmean:        366.0 cm/s MR PISA:         1.57 cm    SHUNTS MR PISA Eff ROA: 12 mm      Systemic VTI:  0.11 m MR PISA Radius:  0.50 cm     Systemic Diam: 2.00 cm Loralie Champagne MD Electronically signed by Loralie Champagne MD Signature Date/Time: 05/05/2021/5:09:15 PM    Final (Updated)     Microbiology Recent Results (from the past 240 hour(s))  Resp Panel by RT-PCR (Flu A&B, Covid) Nasopharyngeal Swab      Status: None   Collection Time: 05/02/2021  8:59 PM   Specimen: Nasopharyngeal Swab; Nasopharyngeal(NP) swabs in vial transport medium  Result Value Ref Range Status   SARS Coronavirus 2 by RT PCR NEGATIVE NEGATIVE Final    Comment: (NOTE) SARS-CoV-2 target nucleic acids are NOT DETECTED.  The SARS-CoV-2 RNA is generally detectable in upper respiratory specimens during the acute phase of infection. The lowest concentration of SARS-CoV-2 viral copies this assay can detect is 138 copies/mL. A negative result does not preclude SARS-Cov-2 infection and should not be used as the sole basis for treatment or other patient management decisions. A negative result may occur with  improper specimen collection/handling, submission of specimen other than nasopharyngeal swab, presence of viral mutation(s) within the areas targeted by this assay, and inadequate number of viral copies(<138 copies/mL). A negative result must be combined with clinical observations, patient history, and epidemiological information. The expected result is Negative.  Fact Sheet for Patients:  EntrepreneurPulse.com.au  Fact Sheet for Healthcare Providers:  IncredibleEmployment.be  This test is no t yet approved or cleared by the [place] FDA and  has been authorized for detection and/or diagnosis of SARS-CoV-2 by FDA under an Emergency Use Authorization (EUA). This EUA will remain  in effect (meaning this test can be used) for the duration of the COVID-19 declaration under Section 564(b)(1) of the Act, 21 U.S.C.section 360bbb-3(b)(1), unless the authorization is terminated  or revoked sooner.       Influenza A by PCR NEGATIVE NEGATIVE Final   Influenza B by PCR NEGATIVE NEGATIVE Final    Comment: (NOTE) The Xpert Xpress SARS-CoV-2/FLU/RSV plus assay is intended as an aid in the diagnosis of influenza from Nasopharyngeal swab specimens and should not be used as a sole basis  for treatment. Nasal washings and aspirates are unacceptable for Xpert Xpress SARS-CoV-2/FLU/RSV testing.  Fact Sheet for Patients: EntrepreneurPulse.com.au  Fact Sheet for Healthcare Providers: IncredibleEmployment.be  This test is not yet approved or cleared by the [place] FDA and has been authorized for detection and/or diagnosis of SARS-CoV-2 by FDA under an Emergency Use Authorization (EUA). This EUA will remain in effect (meaning this test can be used) for the duration of the COVID-19 declaration under Section 564(b)(1) of the Act, 21 U.S.C. section 360bbb-3(b)(1), unless the authorization is terminated or  revoked.  Performed at [facility], [place] [address], [place], [place] [address]   Urine Culture     Status: Abnormal   Collection Time: 05/04/21  1:00 AM   Specimen: Urine, Clean Catch  Result Value Ref Range Status   Specimen Description   Final    URINE, CLEAN CATCH Performed at [facility], [place] [address], [place], [place] [address]    Special Requests   Final    NONE Performed at [facility], [place] [address] [place], [place] [address]    Culture (A)  Final    <10,000 COLONIES/mL INSIGNIFICANT GROWTH Performed at [place] [address] [place], [place] [address]    Report Status 05/05/2021 FINAL  Final  MRSA Next Gen by PCR, Nasal     Status: None   Collection Time: 05/04/21  8:53 AM   Specimen: Nasal Mucosa; Nasal Swab  Result Value Ref Range Status   MRSA by PCR Next Gen NOT DETECTED NOT DETECTED Final    Comment: (NOTE) The GeneXpert MRSA Assay (FDA approved for NASAL specimens only), is one component of a comprehensive MRSA colonization surveillance program. It is not intended to diagnose MRSA infection nor to guide or monitor treatment for MRSA infections. Test performance is not FDA approved in patients less than 48 years old. Performed at  [facility], [place] [address] [place], [place] [address]     Lab Basic Metabolic Panel: Recent Labs  Lab 05/27/2021 2057 05/04/21 1024 05/05/21 0250 05/05/21 0630  NA 145  --  146*  --   K 3.8  --  3.4*  --   CL 109  --  106  --   CO2 25  --  31  --   GLUCOSE 103*  --  69*  --   BUN 25*  --  26*  --   CREATININE 0.91 0.89 1.06*  --   CALCIUM 9.5  --  8.9  --   MG  --  2.1  --  2.1   Liver Function Tests: Recent Labs  Lab 05/10/2021 2057 05/05/21 0250  AST 27 27  ALT 16 17  ALKPHOS 55 68  BILITOT 1.1 0.9  PROT 7.3 7.0  ALBUMIN 2.7* 2.6*   Recent Labs  Lab 05/27/2021 2057  LIPASE 28   No results for input(s): AMMONIA in the last 168 hours. CBC: Recent Labs  Lab 05/10/2021 2057 05/04/21 1024 05/04/21 1355 05/05/21 0250 05/05/21 0630  WBC 9.2 9.1  --  9.9 10.9*  NEUTROABS 7.7  --   --   --   --   HGB 11.5* 12.5  --  11.5* 11.3*  HCT 37.9 43.2  --  38.3 37.8  MCV 102.4* 107.7*  --  102.1* 105.0*  PLT 183 133* 132* 166 189   Cardiac Enzymes: No results for input(s): CKTOTAL, CKMB, CKMBINDEX, TROPONINI in the last 168 hours. Sepsis Labs: Recent Labs  Lab 05/30/2021 2057 05/04/21 0602 05/04/21 1024 05/04/21 1355 05/04/21 1701 05/05/21 0250 05/05/21 0630  PROCALCITON  --  <0.10  --   --   --   --   --   WBC 9.2  --  9.1  --   --  9.9 10.9*  LATICACIDVEN  --   --  3.2* 3.3* 3.7* 2.1*  --     Procedures/Operations  None   Gary Gabrielsen T Corbin Hott 24-May-2021, 10:01 PM

## 2021-06-02 DEATH — deceased

## 2022-12-19 IMAGING — CT CT ABD-PELV W/O CM
2 of 4 series · 16 of 46 positions shown, 18 images · non-contrast
Comparison: CT 07/28/2020

CLINICAL DATA: Diverticulitis suspected

[AGE] with diarrhea today.  Left-sided abdominal pain.
EXAM:
CT ABDOMEN AND PELVIS WITHOUT CONTRAST
TECHNIQUE: Multidetector CT imaging of the abdomen and pelvis was performed
following the standard protocol without IV contrast.

[Series 2: axial st · axial · 0.82mm/px · z∈[-415,-50]mm · 13 of 83 slices shown, 15 images]
[im 5/83  soft-tissue]
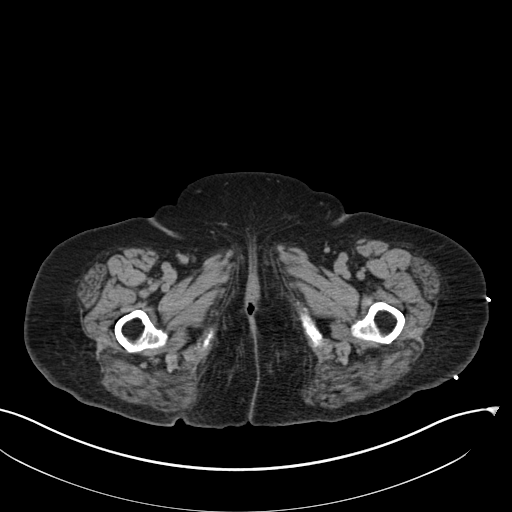
[im 5/83  bone]
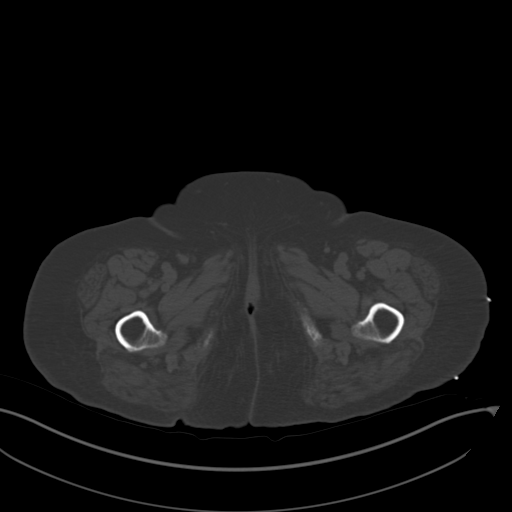
[im 13/83  soft-tissue]
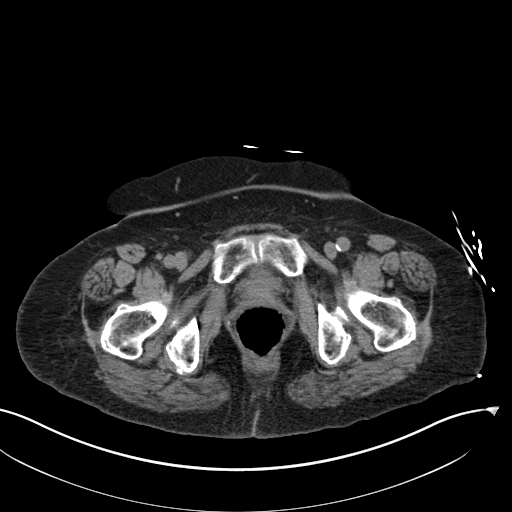
[im 18/83  soft-tissue]
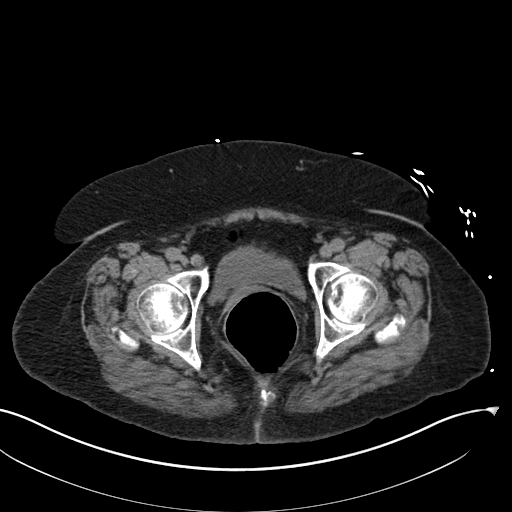
[im 22/83  soft-tissue]
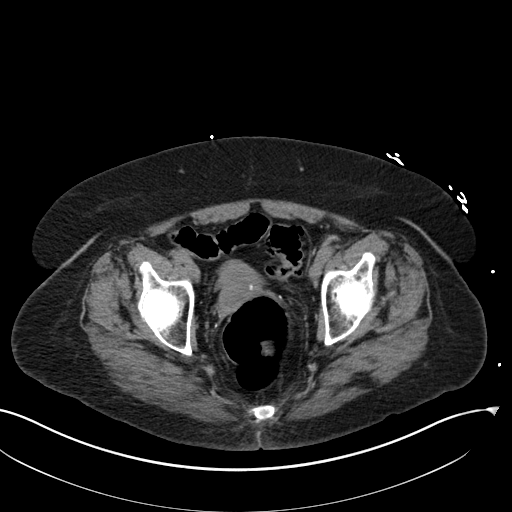
[im 31/83  soft-tissue]
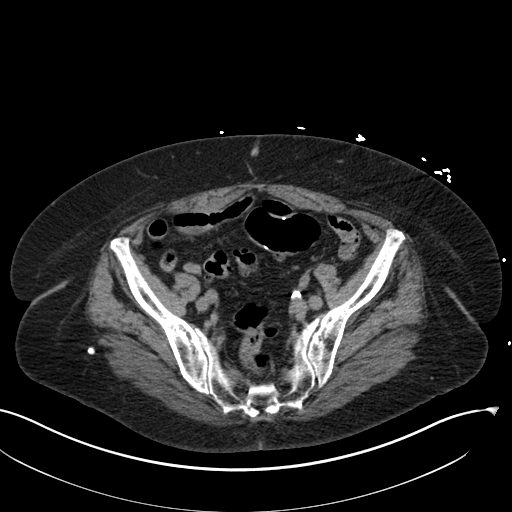
[im 35/83  soft-tissue]
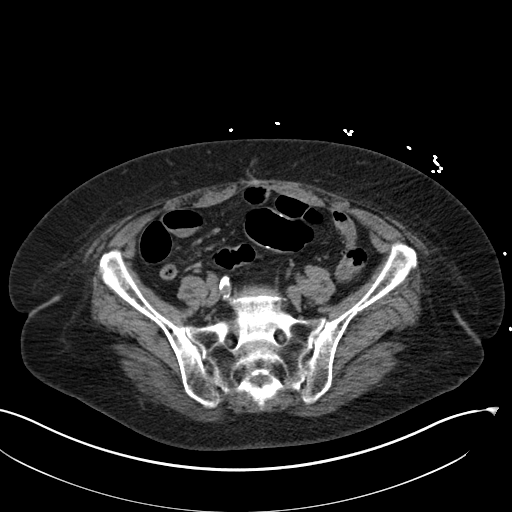
[im 44/83  soft-tissue]
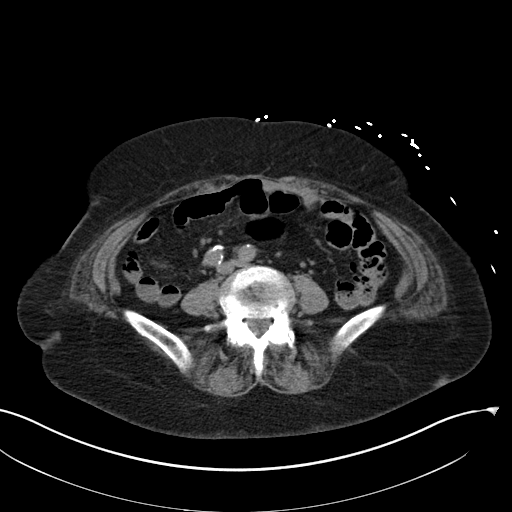
[im 48/83  soft-tissue]
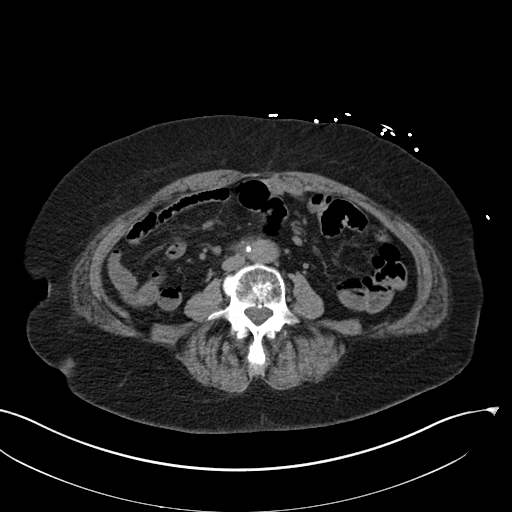
[im 52/83  soft-tissue]
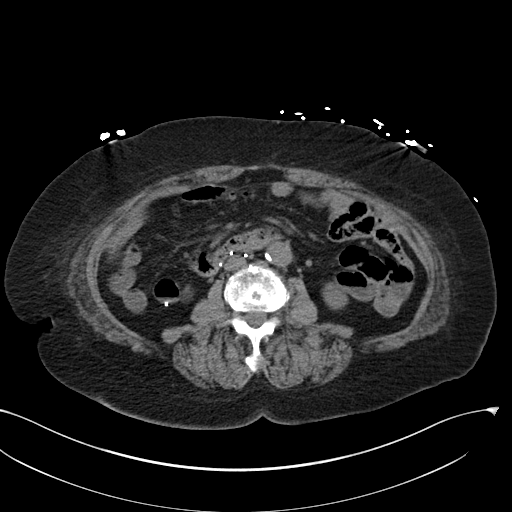
[im 52/83  bone]
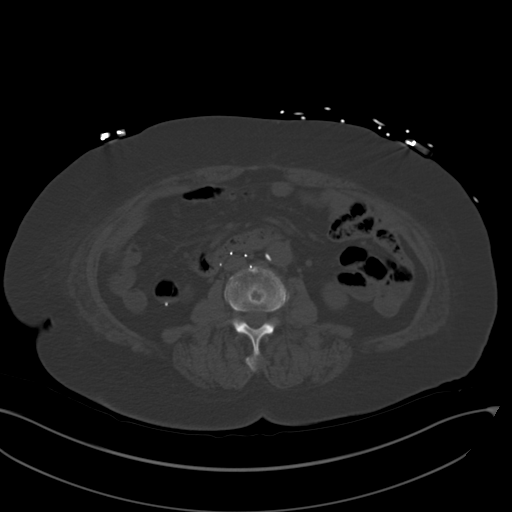
[im 61/83  soft-tissue]
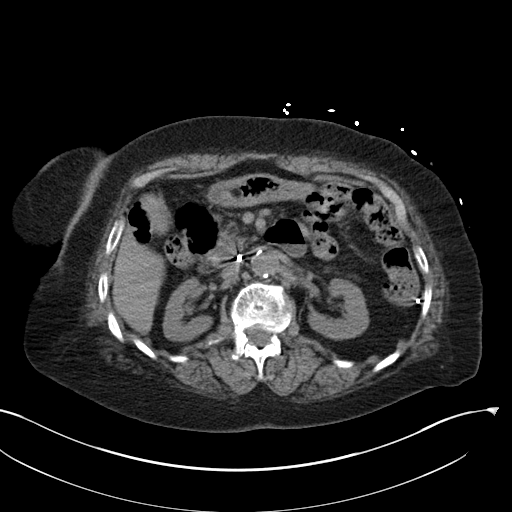
[im 65/83  soft-tissue]
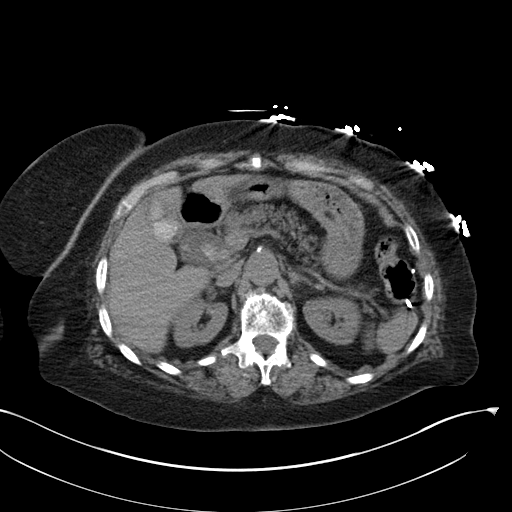
[im 70/83  soft-tissue]
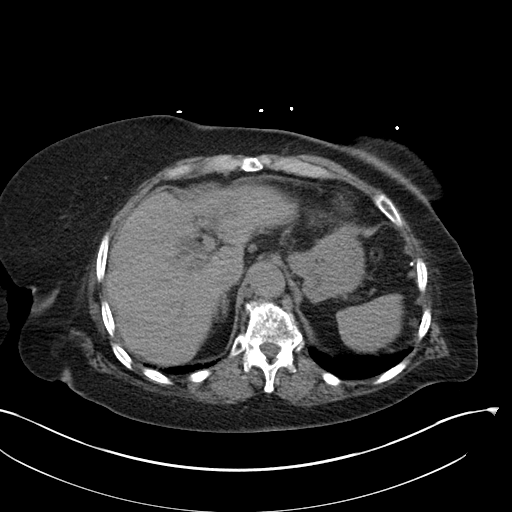
[im 78/83  soft-tissue]
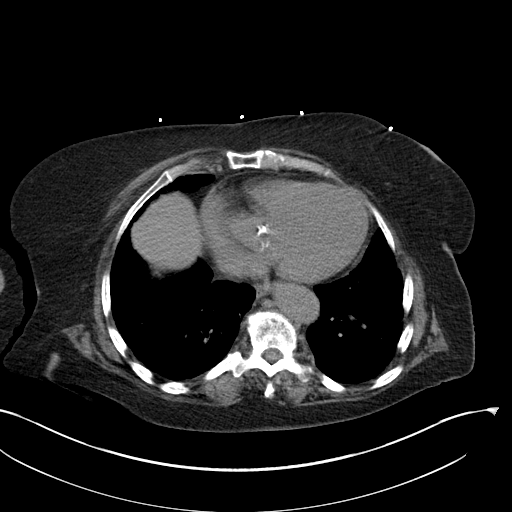

[Series 5: coronal st · coronal · 0.85mm/px · 3 of 146 slices shown]
[im 49/146  soft-tissue]
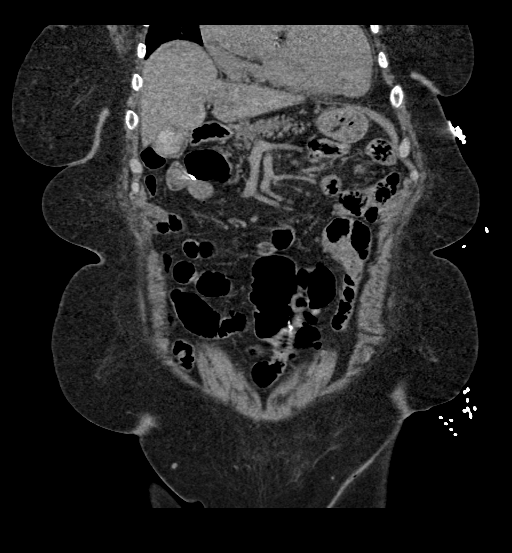
[im 65/146  soft-tissue]
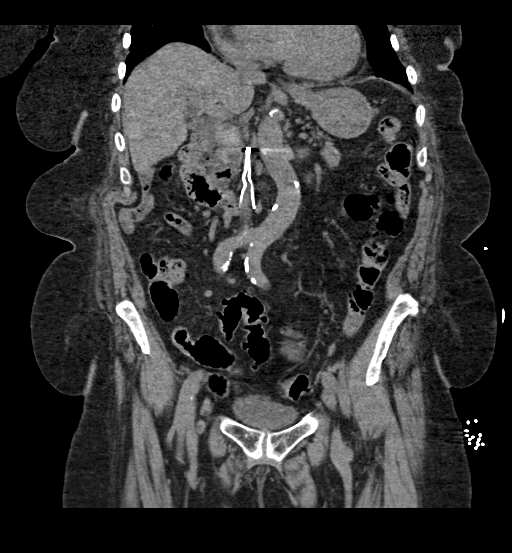
[im 81/146  soft-tissue]
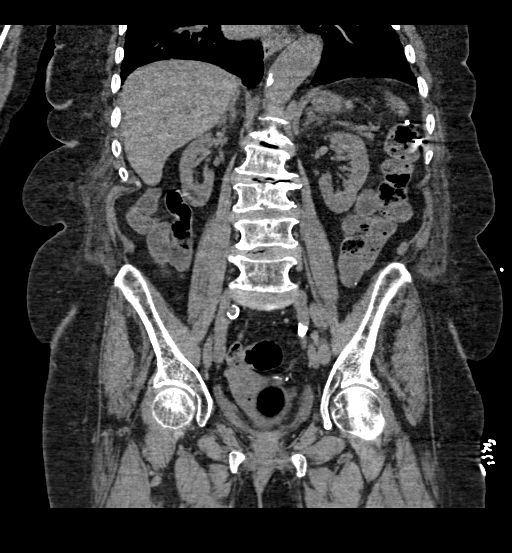

[16 of 46 positions shown; findings below may reference images not displayed]

FINDINGS: Lower chest: The heart is enlarged. Basilar interstitial thickening
is similar to prior exam. No acute airspace disease or pleural
effusion.

Hepatobiliary: No focal hepatic abnormality on noncontrast exam.
Multiple calcified gallstones in the gallbladder, including a stone
in the gallbladder neck. Chronically dilated common bile duct with
probable stone in the upper CBD, series 2, image 20. This was also
seen on prior exam. There is no pericholecystic fat stranding.

Pancreas: Fatty atrophy.  No ductal dilatation or inflammation.

Spleen: Normal in size.  No focal abnormality on noncontrast exam.

Adrenals/Urinary Tract: Normal adrenal glands. No hydronephrosis,
renal stone, or focal renal abnormality. Mild bilateral renal
parenchymal atrophy. Minimally distended urinary bladder. Previous
right bladder diverticulum not seen on the current exam.

Stomach/Bowel: Decompressed stomach. Duodenal diverticulum without
inflammation. No small bowel obstruction or inflammation. Suspected
subtotal colectomy with enteric sutures noted in the proximal
sigmoid colon. Sigmoid diverticulosis without focal diverticulitis.
No bowel wall thickening or inflammatory change.

Vascular/Lymphatic: Aortic atherosclerosis and tortuosity. No aortic
aneurysm. Infrarenal IVC filter in place, stable in appearance. No
bulky abdominopelvic adenopathy.

Reproductive: Small calcified uterine fibroid no adnexal mass.

Other: No free air, free fluid or ascites. No body wall hernia.
There is surgical clips in the right mid abdomen and left upper
quadrant

Musculoskeletal: Multilevel degenerative change throughout the
spine. No acute osseous abnormality or focal bone lesion.
IMPRESSION: 1. No acute abnormality in the abdomen/pelvis.
2. Sigmoid diverticulosis without acute diverticulitis.
3. Gallstones. Chronically dilated common bile duct with probable
stone in the upper CBD, also seen on prior exam. No pericholecystic
fat stranding to suggest acute cholecystitis.

Aortic Atherosclerosis ([id number]).
